# Patient Record
Sex: Male | Born: 1955 | Race: White | Hispanic: No | Smoking: Former smoker
Health system: Southern US, Community
[De-identification: ages and names within clinical notes are randomized; demographics above are authoritative.]

## PROBLEM LIST (undated history)

## (undated) DIAGNOSIS — K649 Unspecified hemorrhoids: Secondary | ICD-10-CM

## (undated) DIAGNOSIS — K219 Gastro-esophageal reflux disease without esophagitis: Secondary | ICD-10-CM

## (undated) DIAGNOSIS — F419 Anxiety disorder, unspecified: Secondary | ICD-10-CM

## (undated) DIAGNOSIS — C259 Malignant neoplasm of pancreas, unspecified: Secondary | ICD-10-CM

## (undated) DIAGNOSIS — K862 Cyst of pancreas: Secondary | ICD-10-CM

## (undated) DIAGNOSIS — K861 Other chronic pancreatitis: Secondary | ICD-10-CM

## (undated) DIAGNOSIS — I10 Essential (primary) hypertension: Secondary | ICD-10-CM

## (undated) DIAGNOSIS — Z973 Presence of spectacles and contact lenses: Secondary | ICD-10-CM

## (undated) DIAGNOSIS — Z8739 Personal history of other diseases of the musculoskeletal system and connective tissue: Secondary | ICD-10-CM

## (undated) HISTORY — DX: Essential (primary) hypertension: I10

## (undated) HISTORY — DX: Malignant neoplasm of pancreas, unspecified: C25.9

## (undated) HISTORY — PX: COLONOSCOPY: SHX174

---

## 2002-09-27 ENCOUNTER — Encounter: Admission: RE | Admit: 2002-09-27 | Discharge: 2002-09-27 | Payer: Self-pay | Admitting: Family Medicine

## 2002-09-27 ENCOUNTER — Encounter: Payer: Self-pay | Admitting: Family Medicine

## 2009-03-11 ENCOUNTER — Encounter: Admission: RE | Admit: 2009-03-11 | Discharge: 2009-03-11 | Payer: Self-pay | Admitting: Family Medicine

## 2013-12-08 ENCOUNTER — Ambulatory Visit
Admission: RE | Admit: 2013-12-08 | Discharge: 2013-12-08 | Disposition: A | Discharge status: Home / Self Care | Payer: BC Managed Care – PPO | Source: Ambulatory Visit | Attending: Physician Assistant | Admitting: Physician Assistant

## 2013-12-08 ENCOUNTER — Other Ambulatory Visit: Payer: Self-pay | Admitting: Physician Assistant

## 2013-12-08 DIAGNOSIS — T1490XA Injury, unspecified, initial encounter: Secondary | ICD-10-CM

## 2014-07-03 ENCOUNTER — Other Ambulatory Visit: Payer: Self-pay | Admitting: Family Medicine

## 2014-07-03 DIAGNOSIS — R109 Unspecified abdominal pain: Secondary | ICD-10-CM

## 2014-07-03 DIAGNOSIS — R140 Abdominal distension (gaseous): Secondary | ICD-10-CM

## 2014-07-04 ENCOUNTER — Ambulatory Visit
Admission: RE | Admit: 2014-07-04 | Discharge: 2014-07-04 | Disposition: A | Discharge status: Home / Self Care | Payer: BC Managed Care – PPO | Source: Ambulatory Visit | Attending: Family Medicine | Admitting: Family Medicine

## 2014-07-04 DIAGNOSIS — R140 Abdominal distension (gaseous): Secondary | ICD-10-CM

## 2014-07-04 DIAGNOSIS — R109 Unspecified abdominal pain: Secondary | ICD-10-CM

## 2014-07-05 ENCOUNTER — Other Ambulatory Visit: Payer: BC Managed Care – PPO

## 2014-07-26 ENCOUNTER — Other Ambulatory Visit: Payer: Self-pay | Admitting: Gastroenterology

## 2014-07-26 DIAGNOSIS — R1084 Generalized abdominal pain: Secondary | ICD-10-CM

## 2014-07-31 ENCOUNTER — Ambulatory Visit
Admission: RE | Admit: 2014-07-31 | Discharge: 2014-07-31 | Disposition: A | Discharge status: Home / Self Care | Payer: BC Managed Care – PPO | Source: Ambulatory Visit | Attending: Gastroenterology | Admitting: Gastroenterology

## 2014-07-31 DIAGNOSIS — R1084 Generalized abdominal pain: Secondary | ICD-10-CM

## 2014-07-31 MED ORDER — IOHEXOL 300 MG/ML  SOLN
125.0000 mL | Freq: Once | INTRAMUSCULAR | Status: AC | PRN
  Administered 2014-07-31: 125 mL via INTRAVENOUS

## 2014-08-01 ENCOUNTER — Other Ambulatory Visit: Payer: Self-pay | Admitting: Gastroenterology

## 2014-08-02 ENCOUNTER — Other Ambulatory Visit: Payer: Self-pay | Admitting: Gastroenterology

## 2014-08-02 ENCOUNTER — Encounter (HOSPITAL_COMMUNITY): Payer: Self-pay | Admitting: *Deleted

## 2014-08-02 NOTE — Addendum Note (Signed)
Addended by: Arta Silence on: 08/02/2014 10:36 AM   Modules accepted: Orders

## 2014-08-03 ENCOUNTER — Encounter (HOSPITAL_COMMUNITY)
Admission: RE | Disposition: A | Discharge status: Home / Self Care | Payer: Self-pay | Source: Ambulatory Visit | Attending: Gastroenterology

## 2014-08-03 ENCOUNTER — Encounter (HOSPITAL_COMMUNITY): Payer: BC Managed Care – PPO | Admitting: Anesthesiology

## 2014-08-03 ENCOUNTER — Ambulatory Visit (HOSPITAL_COMMUNITY)
Admission: RE | Admit: 2014-08-03 | Discharge: 2014-08-03 | Disposition: A | Discharge status: Home / Self Care | Payer: BC Managed Care – PPO | Source: Ambulatory Visit | Attending: Gastroenterology | Admitting: Gastroenterology

## 2014-08-03 ENCOUNTER — Encounter (HOSPITAL_COMMUNITY): Payer: Self-pay | Admitting: *Deleted

## 2014-08-03 ENCOUNTER — Ambulatory Visit (HOSPITAL_COMMUNITY): Payer: BC Managed Care – PPO | Admitting: Anesthesiology

## 2014-08-03 DIAGNOSIS — K869 Disease of pancreas, unspecified: Secondary | ICD-10-CM | POA: Insufficient documentation

## 2014-08-03 DIAGNOSIS — Z87891 Personal history of nicotine dependence: Secondary | ICD-10-CM | POA: Insufficient documentation

## 2014-08-03 DIAGNOSIS — R109 Unspecified abdominal pain: Secondary | ICD-10-CM | POA: Diagnosis present

## 2014-08-03 HISTORY — DX: Unspecified hemorrhoids: K64.9

## 2014-08-03 HISTORY — PX: EUS: SHX5427

## 2014-08-03 SURGERY — ESOPHAGEAL ENDOSCOPIC ULTRASOUND (EUS) RADIAL
Anesthesia: Monitor Anesthesia Care

## 2014-08-03 MED ORDER — BUTAMBEN-TETRACAINE-BENZOCAINE 2-2-14 % EX AERO
INHALATION_SPRAY | CUTANEOUS | Status: DC | PRN
  Administered 2014-08-03: 2 via TOPICAL

## 2014-08-03 MED ORDER — FENTANYL CITRATE 0.05 MG/ML IJ SOLN
INTRAMUSCULAR | Status: DC | PRN
  Administered 2014-08-03: 50 ug via INTRAVENOUS

## 2014-08-03 MED ORDER — LACTATED RINGERS IV SOLN
INTRAVENOUS | Status: DC
  Administered 2014-08-03: 1000 mL via INTRAVENOUS

## 2014-08-03 MED ORDER — PROPOFOL 10 MG/ML IV BOLUS
INTRAVENOUS | Status: DC | PRN
  Administered 2014-08-03: 20 mg via INTRAVENOUS
  Administered 2014-08-03 (×2): 30 mg via INTRAVENOUS

## 2014-08-03 MED ORDER — OXYCODONE-ACETAMINOPHEN 7.5-325 MG PO TABS: 1.0000 | ORAL_TABLET | Freq: Four times a day (QID) | ORAL | Status: DC | PRN

## 2014-08-03 MED ORDER — SODIUM CHLORIDE 0.9 % IV SOLN: INTRAVENOUS | Status: DC

## 2014-08-03 MED ORDER — PROPOFOL INFUSION 10 MG/ML OPTIME
INTRAVENOUS | Status: DC | PRN
  Administered 2014-08-03: 75 ug/kg/min via INTRAVENOUS

## 2014-08-03 MED ORDER — CIPROFLOXACIN IN D5W 400 MG/200ML IV SOLN
INTRAVENOUS | Status: AC
  Filled 2014-08-03: qty 200

## 2014-08-03 MED ORDER — MIDAZOLAM HCL 5 MG/5ML IJ SOLN
INTRAMUSCULAR | Status: DC | PRN
  Administered 2014-08-03: 2 mg via INTRAVENOUS

## 2014-08-03 MED ORDER — LACTATED RINGERS IV SOLN
INTRAVENOUS | Status: DC | PRN
  Administered 2014-08-03 (×2): via INTRAVENOUS

## 2014-08-03 NOTE — Anesthesia Preprocedure Evaluation (Signed)
Anesthesia Evaluation  Patient identified by MRN, date of birth, ID band Patient awake    Reviewed: Allergy & Precautions, H&P , NPO status , Patient's Chart, lab work & pertinent test results  History of Anesthesia Complications Negative for: history of anesthetic complications  Airway Mallampati: II TM Distance: >3 FB Neck ROM: Full    Dental  (+) Teeth Intact   Pulmonary neg sleep apnea, neg COPDneg recent URI, former smoker,  breath sounds clear to auscultation        Cardiovascular negative cardio ROS  Rhythm:Regular     Neuro/Psych negative neurological ROS  negative psych ROS   GI/Hepatic Neg liver ROS, Abdominal pain   Endo/Other  negative endocrine ROS  Renal/GU negative Renal ROS     Musculoskeletal   Abdominal   Peds  Hematology negative hematology ROS (+)   Anesthesia Other Findings   Reproductive/Obstetrics                           Anesthesia Physical Anesthesia Plan  ASA: II  Anesthesia Plan: MAC   Post-op Pain Management:    Induction: Intravenous  Airway Management Planned: Natural Airway  Additional Equipment: None  Intra-op Plan:   Post-operative Plan: Extubation in OR  Informed Consent: I have reviewed the patients History and Physical, chart, labs and discussed the procedure including the risks, benefits and alternatives for the proposed anesthesia with the patient or authorized representative who has indicated his/her understanding and acceptance.   Dental advisory given  Plan Discussed with: CRNA and Surgeon  Anesthesia Plan Comments:         Anesthesia Quick Evaluation

## 2014-08-03 NOTE — Transfer of Care (Signed)
Immediate Anesthesia Transfer of Care Note  Patient: Richard Cantu  Procedure(s) Performed: Procedure(s) with comments: ESOPHAGEAL ENDOSCOPIC ULTRASOUND (EUS) RADIAL (N/A) - H&P in file  Patient Location: Endoscopy Unit  Anesthesia Type:MAC  Level of Consciousness: awake, alert , oriented and patient cooperative  Airway & Oxygen Therapy: Patient Spontanous Breathing and Patient connected to nasal cannula oxygen  Post-op Assessment: Report given to PACU RN and Post -op Vital signs reviewed and stable  Post vital signs: Reviewed  Complications: No apparent anesthesia complications

## 2014-08-03 NOTE — Anesthesia Procedure Notes (Signed)
Procedure Name: MAC Date/Time: 08/03/2014 1:19 PM Performed by: Jenne Campus Pre-anesthesia Checklist: Patient identified, Emergency Drugs available, Suction available, Timeout performed and Patient being monitored Patient Re-evaluated:Patient Re-evaluated prior to inductionOxygen Delivery Method: Nasal cannula

## 2014-08-03 NOTE — Op Note (Signed)
[facility] [place], [address]   ENDOSCOPIC ULTRASOUND PROCEDURE REPORT  PATIENT: Richard Cantu, Richard Cantu  MR#: [id number] BIRTHDATE: 08/16/1956  GENDER: Male ENDOSCOPIST: Arta Silence, MD REFERRED BY:  Wilford Corner, M.D. PROCEDURE DATE:  08/03/2014 PROCEDURE:   Upper EUS ASA CLASS:      Class II INDICATIONS:   1.  abdominal pain, pancreatic mass on CT. MEDICATIONS: MAC sedation, administered by CRNA and Cetacaine spray x 2  DESCRIPTION OF PROCEDURE:   After the risks benefits and alternatives of the procedure were  explained, informed consent was obtained. The patient was then placed in the left, lateral, decubitus postion and IV sedation was administered. Throughout the procedure, the patients blood pressure, pulse and oxygen saturations were monitored continuously.  Under direct visualization, the [id number] ([id number]) and [id number] ([id number]) endoscope was introduced through the mouth  and advanced to the second portion of the duodenum .  Water was used as necessary to provide an acoustic interface.  Upon completion of the imaging, water was removed and the patient was sent to the recovery room in satisfactory condition. (No images recovered due to image cart being used for emergency procedure elsewhere in hospital)  FINDINGS:      Extensive lobularity, especially in the body and tail of pancreas, as well as scattered hyperechoic strands/foci, all consistent with resolving acute pancreatitis versus smoldering chronic pancreatitis (favored).  At the interface of the head and uncinate pancreas, an approximately 3 x 3cm cystic region, not well-demarcated, with small amount of internal debris, was noted. There was no obvious solid component to this area.  No obvious communication of this area with the pancreatic duct. A few triangular benign-appearing peripancreatic lymph nodes were seen. Normal appearance to bile duct.  Prominent  pancreatic duct (38mm) without obstructing lesion.  IMPRESSION:     As above.  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   No obvious solid pancreatic mass lesion was seen.  Changes in pancreas noted also that would favor evolving chronic pancreatitis.  RECOMMENDATIONS:     1.  Watch for potential complications of procedure. 2.  Strict alcohol abstinence. 3.  Trial of pancreatic enzymes for bloating and pain symptoms. 4.  Vicodin 5/500, one po q 6 hrs prn pain, #15, no refills. 5.  Consider repeat CT in 2-3 months. 6.  Will discuss case with Dr. Michail Sermon.   _______________________________ Lorrin MaisArta Silence, MD 08/03/2014 2:23 PM   CC:

## 2014-08-03 NOTE — H&P (Signed)
Patient interval history reviewed.  Patient examined again.  There has been no change from documented H/P (scanned into chart from our office) except as documented above.  Assessment:  1.  Abdominal pain. 2.  Pancreatic mass on CT scan.  Plan:  1.  Endoscopic ultrasound with possible fine needle aspiration biopsies. 2.  Risks (bleeding, infection, bowel perforation that could require surgery, sedation-related changes in cardiopulmonary systems), benefits (identification and possible treatment of source of symptoms, exclusion of certain causes of symptoms), and alternatives (watchful waiting, radiographic imaging studies, empiric medical treatment) of upper endoscopy with ultrasound and possible biopsies (EUS +/- FNA) were explained to patient/family in detail and patient wishes to proceed.

## 2014-08-03 NOTE — Discharge Instructions (Signed)
Endoscopic Ultrasound ° °Care After °Please read the instructions outlined below and refer to this sheet in the next few weeks. These discharge instructions provide you with general information on caring for yourself after you leave the hospital. Your doctor may also give you specific instructions. While your treatment has been planned according to the most current medical practices available, unavoidable complications occasionally occur. If you have any problems or questions after discharge, please call Dr. Khalaya Mcgurn ([facility]) at [phone]. ° °HOME CARE INSTRUCTIONS °Activity °· You may resume your regular activity but move at a slower pace for the next 24 hours.  °· Take frequent rest periods for the next 24 hours.  °· Walking will help expel (get rid of) the air and reduce the bloated feeling in your abdomen.  °· No driving for 24 hours (because of the anesthesia (medicine) used during the test).  °· You may shower.  °· Do not sign any important legal documents or operate any machinery for 24 hours (because of the anesthesia used during the test).  °Nutrition °· Drink plenty of fluids.  °· You may resume your normal diet.  °· Begin with a light meal and progress to your normal diet.  °· Avoid alcoholic beverages for 24 hours or as instructed by your caregiver.  °Medications °You may resume your normal medications unless your caregiver tells you otherwise. °What you can expect today °· You may experience abdominal discomfort such as a feeling of fullness or "gas" pains.  °· You may experience a sore throat for 2 to 3 days. This is normal. Gargling with salt water may help this.  °·  °SEEK IMMEDIATE MEDICAL CARE IF: °· You have excessive nausea (feeling sick to your stomach) and/or vomiting.  °· You have severe abdominal pain and distention (swelling).  °· You have trouble swallowing.  °· You have a temperature over 100° F (37.8° C).  °· You have rectal bleeding or vomiting of blood.  °Document  Released: 06/23/2004 Document Revised: 07/22/2011 Document Reviewed: 01/04/2008 °Exit[employer]® Patient Information ©2012 [employer]. °

## 2014-08-03 NOTE — Anesthesia Postprocedure Evaluation (Signed)
  Anesthesia Post-op Note  Patient: Richard Cantu  Procedure(s) Performed: Procedure(s) with comments: ESOPHAGEAL ENDOSCOPIC ULTRASOUND (EUS) RADIAL (N/A) - H&P in file  Patient Location: Endoscopy Unit  Anesthesia Type:MAC  Level of Consciousness: awake  Airway and Oxygen Therapy: Patient Spontanous Breathing  Post-op Pain: none  Post-op Assessment: Post-op Vital signs reviewed, Patient's Cardiovascular Status Stable, Respiratory Function Stable, Patent Airway, No signs of Nausea or vomiting and Pain level controlled  Post-op Vital Signs: Reviewed and stable  Last Vitals:  Filed Vitals:   08/03/14 1416  BP:   Pulse: 86  Temp:   Resp: 14    Complications: No apparent anesthesia complications

## 2014-08-06 ENCOUNTER — Encounter (HOSPITAL_COMMUNITY): Payer: Self-pay | Admitting: Gastroenterology

## 2014-08-28 ENCOUNTER — Other Ambulatory Visit: Payer: Self-pay | Admitting: Gastroenterology

## 2014-08-28 DIAGNOSIS — K863 Pseudocyst of pancreas: Secondary | ICD-10-CM

## 2014-08-30 ENCOUNTER — Inpatient Hospital Stay (HOSPITAL_COMMUNITY)
Admission: EM | Admit: 2014-08-30 | Discharge: 2014-09-08 | DRG: 423 | Disposition: A | Discharge status: Home / Self Care | Payer: BC Managed Care – PPO | Attending: Internal Medicine | Admitting: Internal Medicine

## 2014-08-30 ENCOUNTER — Encounter (HOSPITAL_COMMUNITY): Payer: Self-pay | Admitting: Emergency Medicine

## 2014-08-30 ENCOUNTER — Emergency Department (HOSPITAL_COMMUNITY): Payer: BC Managed Care – PPO

## 2014-08-30 DIAGNOSIS — K863 Pseudocyst of pancreas: Secondary | ICD-10-CM | POA: Diagnosis present

## 2014-08-30 DIAGNOSIS — R1010 Upper abdominal pain, unspecified: Secondary | ICD-10-CM

## 2014-08-30 DIAGNOSIS — G8929 Other chronic pain: Secondary | ICD-10-CM | POA: Diagnosis present

## 2014-08-30 DIAGNOSIS — Z6827 Body mass index (BMI) 27.0-27.9, adult: Secondary | ICD-10-CM | POA: Diagnosis not present

## 2014-08-30 DIAGNOSIS — F1010 Alcohol abuse, uncomplicated: Secondary | ICD-10-CM | POA: Diagnosis present

## 2014-08-30 DIAGNOSIS — K861 Other chronic pancreatitis: Secondary | ICD-10-CM | POA: Diagnosis present

## 2014-08-30 DIAGNOSIS — E46 Unspecified protein-calorie malnutrition: Secondary | ICD-10-CM | POA: Diagnosis present

## 2014-08-30 DIAGNOSIS — C250 Malignant neoplasm of head of pancreas: Secondary | ICD-10-CM | POA: Diagnosis present

## 2014-08-30 DIAGNOSIS — Z87891 Personal history of nicotine dependence: Secondary | ICD-10-CM

## 2014-08-30 DIAGNOSIS — K859 Acute pancreatitis without necrosis or infection, unspecified: Secondary | ICD-10-CM

## 2014-08-30 DIAGNOSIS — I10 Essential (primary) hypertension: Secondary | ICD-10-CM | POA: Diagnosis present

## 2014-08-30 DIAGNOSIS — R109 Unspecified abdominal pain: Secondary | ICD-10-CM

## 2014-08-30 DIAGNOSIS — R1084 Generalized abdominal pain: Secondary | ICD-10-CM

## 2014-08-30 DIAGNOSIS — C259 Malignant neoplasm of pancreas, unspecified: Secondary | ICD-10-CM

## 2014-08-30 HISTORY — DX: Other chronic pancreatitis: K86.1

## 2014-08-30 HISTORY — DX: Anxiety disorder, unspecified: F41.9

## 2014-08-30 HISTORY — DX: Personal history of other diseases of the musculoskeletal system and connective tissue: Z87.39

## 2014-08-30 HISTORY — DX: Cyst of pancreas: K86.2

## 2014-08-30 LAB — CBC
HCT: 43 % (ref 39.0–52.0)
Hemoglobin: 15.9 g/dL (ref 13.0–17.0)
MCH: 32.6 pg (ref 26.0–34.0)
MCHC: 37 g/dL — AB (ref 30.0–36.0)
MCV: 88.1 fL (ref 78.0–100.0)
PLATELETS: 306 10*3/uL (ref 150–400)
RBC: 4.88 MIL/uL (ref 4.22–5.81)
RDW: 11.4 % — ABNORMAL LOW (ref 11.5–15.5)
WBC: 9.8 10*3/uL (ref 4.0–10.5)

## 2014-08-30 LAB — CBC WITH DIFFERENTIAL/PLATELET
BASOS PCT: 1 % (ref 0–1)
Basophils Absolute: 0 10*3/uL (ref 0.0–0.1)
EOS ABS: 0.1 10*3/uL (ref 0.0–0.7)
EOS PCT: 1 % (ref 0–5)
HCT: 42.3 % (ref 39.0–52.0)
Hemoglobin: 15.4 g/dL (ref 13.0–17.0)
Lymphocytes Relative: 19 % (ref 12–46)
Lymphs Abs: 1.5 10*3/uL (ref 0.7–4.0)
MCH: 31.8 pg (ref 26.0–34.0)
MCHC: 36.4 g/dL — AB (ref 30.0–36.0)
MCV: 87.4 fL (ref 78.0–100.0)
Monocytes Absolute: 0.5 10*3/uL (ref 0.1–1.0)
Monocytes Relative: 7 % (ref 3–12)
Neutro Abs: 5.8 10*3/uL (ref 1.7–7.7)
Neutrophils Relative %: 72 % (ref 43–77)
PLATELETS: 316 10*3/uL (ref 150–400)
RBC: 4.84 MIL/uL (ref 4.22–5.81)
RDW: 11.3 % — ABNORMAL LOW (ref 11.5–15.5)
WBC: 7.9 10*3/uL (ref 4.0–10.5)

## 2014-08-30 LAB — COMPREHENSIVE METABOLIC PANEL
ALBUMIN: 4.7 g/dL (ref 3.5–5.2)
ALK PHOS: 51 U/L (ref 39–117)
ALT: 28 U/L (ref 0–53)
ANION GAP: 16 — AB (ref 5–15)
AST: 20 U/L (ref 0–37)
BUN: 21 mg/dL (ref 6–23)
CALCIUM: 9.6 mg/dL (ref 8.4–10.5)
CO2: 25 mEq/L (ref 19–32)
Chloride: 96 mEq/L (ref 96–112)
Creatinine, Ser: 0.85 mg/dL (ref 0.50–1.35)
GFR calc non Af Amer: >90 mL/min (ref >90)
Glucose, Bld: 123 mg/dL — ABNORMAL HIGH (ref 70–99)
POTASSIUM: 4.3 meq/L (ref 3.7–5.3)
Sodium: 137 mEq/L (ref 137–147)
TOTAL PROTEIN: 8.1 g/dL (ref 6.0–8.3)
Total Bilirubin: 0.7 mg/dL (ref 0.3–1.2)

## 2014-08-30 LAB — CREATININE, SERUM
CREATININE: 0.72 mg/dL (ref 0.50–1.35)
GFR calc Af Amer: >90 mL/min (ref >90)
GFR calc non Af Amer: >90 mL/min (ref >90)

## 2014-08-30 LAB — HEMOGLOBIN A1C
HEMOGLOBIN A1C: 5.5 % (ref <5.7)
Mean Plasma Glucose: 111 mg/dL (ref <117)

## 2014-08-30 LAB — URINALYSIS, ROUTINE W REFLEX MICROSCOPIC
Bilirubin Urine: NEGATIVE
Glucose, UA: NEGATIVE mg/dL
HGB URINE DIPSTICK: NEGATIVE
Ketones, ur: NEGATIVE mg/dL
LEUKOCYTES UA: NEGATIVE
Nitrite: NEGATIVE
PROTEIN: NEGATIVE mg/dL
SPECIFIC GRAVITY, URINE: 1.017 (ref 1.005–1.030)
UROBILINOGEN UA: 0.2 mg/dL (ref 0.0–1.0)
pH: 6 (ref 5.0–8.0)

## 2014-08-30 LAB — LIPASE, BLOOD: Lipase: 50 U/L (ref 11–59)

## 2014-08-30 MED ORDER — ACETAMINOPHEN 325 MG PO TABS
650.0000 mg | ORAL_TABLET | Freq: Four times a day (QID) | ORAL | Status: DC | PRN
Start: 2014-08-30 — End: 2014-09-02

## 2014-08-30 MED ORDER — ALUM & MAG HYDROXIDE-SIMETH 200-200-20 MG/5ML PO SUSP
30.0000 mL | Freq: Four times a day (QID) | ORAL | Status: DC | PRN
  Filled 2014-08-30: qty 30

## 2014-08-30 MED ORDER — HYDROMORPHONE HCL 1 MG/ML IJ SOLN
1.0000 mg | INTRAMUSCULAR | Status: AC | PRN
  Administered 2014-08-30 (×2): 1 mg via INTRAVENOUS
  Filled 2014-08-30 (×2): qty 1

## 2014-08-30 MED ORDER — ACETAMINOPHEN 650 MG RE SUPP: 650.0000 mg | Freq: Four times a day (QID) | RECTAL | Status: DC | PRN

## 2014-08-30 MED ORDER — HYDROMORPHONE HCL 1 MG/ML IJ SOLN
1.0000 mg | INTRAMUSCULAR | Status: DC | PRN
  Administered 2014-08-30: 1 mg via INTRAVENOUS
  Filled 2014-08-30: qty 1

## 2014-08-30 MED ORDER — HYDRALAZINE HCL 20 MG/ML IJ SOLN: 10.0000 mg | Freq: Four times a day (QID) | INTRAMUSCULAR | Status: DC | PRN

## 2014-08-30 MED ORDER — PANTOPRAZOLE SODIUM 40 MG IV SOLR
40.0000 mg | Freq: Two times a day (BID) | INTRAVENOUS | Status: DC
  Administered 2014-08-30 – 2014-09-01 (×4): 40 mg via INTRAVENOUS
  Filled 2014-08-30 (×7): qty 40

## 2014-08-30 MED ORDER — ONDANSETRON HCL 4 MG PO TABS
4.0000 mg | ORAL_TABLET | Freq: Four times a day (QID) | ORAL | Status: DC | PRN
Start: 2014-08-30 — End: 2014-09-01

## 2014-08-30 MED ORDER — HYDROMORPHONE HCL 1 MG/ML IJ SOLN: 1.0000 mg | INTRAMUSCULAR | Status: DC | PRN

## 2014-08-30 MED ORDER — OXYCODONE HCL 5 MG PO TABS
5.0000 mg | ORAL_TABLET | ORAL | Status: DC | PRN
  Administered 2014-08-30 – 2014-09-01 (×7): 10 mg via ORAL
  Filled 2014-08-30 (×2): qty 2
  Filled 2014-08-30: qty 1
  Filled 2014-08-30 (×3): qty 2
  Filled 2014-08-30: qty 1
  Filled 2014-08-30: qty 2

## 2014-08-30 MED ORDER — HYDROMORPHONE HCL 1 MG/ML IJ SOLN
1.0000 mg | Freq: Once | INTRAMUSCULAR | Status: AC
  Administered 2014-08-30: 1 mg via INTRAVENOUS
  Filled 2014-08-30: qty 1

## 2014-08-30 MED ORDER — SODIUM CHLORIDE 0.9 % IV SOLN
INTRAVENOUS | Status: DC
  Administered 2014-08-30: 125 mL via INTRAVENOUS
  Administered 2014-08-31: via INTRAVENOUS

## 2014-08-30 MED ORDER — SODIUM CHLORIDE 0.9 % IV BOLUS (SEPSIS)
1000.0000 mL | Freq: Once | INTRAVENOUS | Status: AC
  Administered 2014-08-30: 1000 mL via INTRAVENOUS

## 2014-08-30 MED ORDER — ONDANSETRON HCL 4 MG/2ML IJ SOLN
4.0000 mg | Freq: Once | INTRAMUSCULAR | Status: AC
  Administered 2014-08-30: 4 mg via INTRAVENOUS
  Filled 2014-08-30: qty 2

## 2014-08-30 MED ORDER — HYDROMORPHONE HCL 1 MG/ML IJ SOLN: 1.0000 mg | Freq: Once | INTRAMUSCULAR | Status: DC

## 2014-08-30 MED ORDER — SENNOSIDES-DOCUSATE SODIUM 8.6-50 MG PO TABS
1.0000 | ORAL_TABLET | Freq: Every evening | ORAL | Status: DC | PRN
  Administered 2014-08-31 – 2014-09-02 (×3): 1 via ORAL
  Filled 2014-08-30 (×3): qty 1

## 2014-08-30 MED ORDER — HEPARIN SODIUM (PORCINE) 5000 UNIT/ML IJ SOLN
5000.0000 [IU] | Freq: Three times a day (TID) | INTRAMUSCULAR | Status: DC
  Administered 2014-08-30 – 2014-09-08 (×26): 5000 [IU] via SUBCUTANEOUS
  Filled 2014-08-30 (×37): qty 1

## 2014-08-30 MED ORDER — HYDROMORPHONE HCL 1 MG/ML IJ SOLN
1.0000 mg | INTRAMUSCULAR | Status: DC | PRN
  Administered 2014-08-30 – 2014-09-01 (×12): 1 mg via INTRAVENOUS
  Filled 2014-08-30 (×12): qty 1

## 2014-08-30 MED ORDER — ONDANSETRON HCL 4 MG/2ML IJ SOLN: 4.0000 mg | Freq: Three times a day (TID) | INTRAMUSCULAR | Status: DC | PRN

## 2014-08-30 MED ORDER — IOHEXOL 300 MG/ML  SOLN
100.0000 mL | Freq: Once | INTRAMUSCULAR | Status: AC | PRN
  Administered 2014-08-30: 100 mL via INTRAVENOUS

## 2014-08-30 MED ORDER — ONDANSETRON 4 MG PO TBDP
8.0000 mg | ORAL_TABLET | Freq: Once | ORAL | Status: AC
  Administered 2014-08-30: 8 mg via ORAL
  Filled 2014-08-30: qty 2

## 2014-08-30 MED ORDER — FENTANYL CITRATE 0.05 MG/ML IJ SOLN
50.0000 ug | Freq: Once | INTRAMUSCULAR | Status: AC
  Administered 2014-08-30: 50 ug via INTRAVENOUS
  Filled 2014-08-30: qty 2

## 2014-08-30 MED ORDER — ONDANSETRON HCL 4 MG/2ML IJ SOLN
4.0000 mg | Freq: Four times a day (QID) | INTRAMUSCULAR | Status: DC | PRN
  Administered 2014-08-30 – 2014-09-08 (×8): 4 mg via INTRAVENOUS
  Filled 2014-08-30 (×9): qty 2

## 2014-08-30 MED ORDER — IOHEXOL 300 MG/ML  SOLN
25.0000 mL | INTRAMUSCULAR | Status: AC
  Administered 2014-08-30: 25 mL via ORAL

## 2014-08-30 MED ORDER — ALPRAZOLAM 0.5 MG PO TABS
1.0000 mg | ORAL_TABLET | Freq: Every evening | ORAL | Status: DC | PRN
  Administered 2014-08-30 – 2014-09-06 (×7): 1 mg via ORAL
  Filled 2014-08-30 (×8): qty 2

## 2014-08-30 NOTE — ED Notes (Signed)
The pt has chronic  Pancreatitis his last alcohol was one month ago.  He has not been able to keep any food down.  n and v.  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  He is scheduled for a c-t scan Friday.  He has taken 2 percocet 1 vicodin oxycontin and his pain is not any better

## 2014-08-30 NOTE — Consult Note (Signed)
[facility]enterology Consultation Note  Referring Provider: Dr. Estill Cotta [facility]) Primary Care Physician:  Donnie Coffin, MD  Reason for Consultation:  Abdominal pain, pancreatic lesion  HPI: Richard Cantu is a 58 y.o. male admitted for abdominal pain and abnormal CT abdomen.  Patient with history of heavy alcohol use and presented with progressive post-prandial abdominal pain and ~ 10-lb weight loss, and CT showing lesion in head of pancreas.  Has stopped alcohol entirely over the past one month, with no improvement of symptoms.  Had CT repeated showing stable-appearing lesion in head of pancreas with neighboring adenopathy worrisome for malignancy.  Endoscopic ultrasound done about one month ago, with appearance most typical of acute on chronic pancreatitis and pancreatic cyst.  Unable to eat more than liquids without having significant pain and nausea and vomiting.   Past Medical History  Diagnosis Date  . Hemorrhoids   . Pancreatitis     Past Surgical History  Procedure Laterality Date  . Colonoscopy    . Eus N/A 08/03/2014    Procedure: ESOPHAGEAL ENDOSCOPIC ULTRASOUND (EUS) RADIAL;  Surgeon: Arta Silence, MD;  Location: [place];  Service: Endoscopy;  Laterality: N/A;  H&P in file    Prior to Admission medications   Medication Sig Start Date End Date Taking? Authorizing Provider  ALPRAZolam Duanne Moron) 1 MG tablet Take 1 mg by mouth at bedtime as needed for anxiety.   Yes Historical Provider, MD  HYDROcodone-acetaminophen (NORCO/VICODIN) 5-325 MG per tablet Take 1 tablet by mouth every 6 (six) hours as needed for moderate pain.   Yes Historical Provider, MD  ibuprofen (ADVIL,MOTRIN) 200 MG tablet Take 800 mg by mouth every 8 (eight) hours as needed for moderate pain.   Yes Historical Provider, MD  lactose free nutrition (BOOST) LIQD Take 237 mLs by mouth 4 (four) times daily.   Yes Historical Provider, MD  lipase/protease/amylase (CREON) 12000 UNITS CPEP capsule Take 12,000  Units by mouth 3 (three) times daily with meals.   Yes Historical Provider, MD  oxyCODONE-acetaminophen (PERCOCET) 7.5-325 MG per tablet Take 1 tablet by mouth every 6 (six) hours as needed for pain. 08/03/14  Yes Arta Silence, MD  pantoprazole (PROTONIX) 40 MG tablet Take 40 mg by mouth daily.   Yes Historical Provider, MD  Probiotic Product (PROBIOTIC DAILY PO) Take 1 tablet by mouth daily.   Yes Historical Provider, MD    Current Facility-Administered Medications  Medication Dose Route Frequency Provider Last Rate Last Dose  . 0.9 %  sodium chloride infusion   Intravenous Continuous Ripudeep K Rai, MD 125 mL/hr at 08/30/14 1444 125 mL at 08/30/14 1444  . acetaminophen (TYLENOL) tablet 650 mg  650 mg Oral Q6H PRN Ripudeep Krystal Eaton, MD       Or  . acetaminophen (TYLENOL) suppository 650 mg  650 mg Rectal Q6H PRN Ripudeep Krystal Eaton, MD      . ALPRAZolam Duanne Moron) tablet 1 mg  1 mg Oral QHS PRN Ripudeep Krystal Eaton, MD      . alum & mag hydroxide-simeth (MAALOX/MYLANTA) 200-200-20 MG/5ML suspension 30 mL  30 mL Oral Q6H PRN Ripudeep K Rai, MD      . heparin injection 5,000 Units  5,000 Units Subcutaneous 3 times per day Ripudeep Krystal Eaton, MD      . hydrALAZINE (APRESOLINE) injection 10 mg  10 mg Intravenous Q6H PRN Ripudeep K Rai, MD      . HYDROmorphone (DILAUDID) injection 1 mg  1 mg Intravenous Q3H PRN Ripudeep Krystal Eaton, MD  1 mg at 08/30/14 1444  . ondansetron (ZOFRAN) tablet 4 mg  4 mg Oral Q6H PRN Ripudeep K Rai, MD       Or  . ondansetron (ZOFRAN) injection 4 mg  4 mg Intravenous Q6H PRN Ripudeep Krystal Eaton, MD   4 mg at 08/30/14 1445  . oxyCODONE (Oxy IR/ROXICODONE) immediate release tablet 5-10 mg  5-10 mg Oral Q4H PRN Ripudeep K Rai, MD      . pantoprazole (PROTONIX) injection 40 mg  40 mg Intravenous Q12H Ripudeep K Rai, MD      . senna-docusate (Senokot-S) tablet 1 tablet  1 tablet Oral QHS PRN Ripudeep Krystal Eaton, MD        Allergies as of 08/30/2014  . (No Known Allergies)    No family history on  file.  History   Social History  . Marital Status: Divorced    Spouse Name: N/A    Number of Children: N/A  . Years of Education: N/A   Occupational History  . Not on file.   Social History Main Topics  . Smoking status: Former Smoker -- 20 years  . Smokeless tobacco: Not on file  . Alcohol Use: 16.8 oz/week    28 Cans of beer per week  . Drug Use: No  . Sexual Activity: Yes     Comment: 08/02/14--quit a couple years ago   Other Topics Concern  . Not on file   Social History Narrative  . No narrative on file    Review of Systems: ROS Dr. Tana Coast 08/30/14 reviewed and I agree  Physical Exam: Vital signs in last 24 hours: Temp:  [97.5 F (36.4 C)-98.7 F (37.1 C)] 97.5 F (36.4 C) (10/08 1411) Pulse Rate:  [67-111] 73 (10/08 1411) Resp:  [11-28] 17 (10/08 1411) BP: (134-175)/(82-109) 158/90 mmHg (10/08 1411) SpO2:  [94 %-100 %] 99 % (10/08 1411) Weight:  [83.689 kg (184 lb 8 oz)] 83.689 kg (184 lb 8 oz) (10/08 0029)   General:   Alert,  Uncomfortable-appearing but not acutely toxic-appearing Head:  Normocephalic and atraumatic. Eyes:  Sclera clear, no icterus.   Conjunctiva pink. Ears:  Normal auditory acuity. Nose:  No deformity, discharge,  or lesions. Mouth:  No deformity or lesions.  Oropharynx pink but somewhat dry Neck:  Supple; no masses or thyromegaly. Lungs:  Clear throughout to auscultation.   No wheezes, crackles, or rhonchi. No acute distress. Heart:  Regular rate and rhythm; no murmurs, clicks, rubs,  or gallops. Abdomen:  Soft, epigastric tenderness with some voluntary guarding to moderate palpation. No masses, hepatosplenomegaly or hernias noted. Bowel sounds are present.  No peritonitis   Msk:  Symmetrical without gross deformities. Normal posture. Pulses:  Normal pulses noted. Extremities:  Without clubbing or edema. Neurologic:  Alert and  oriented x4;  Diffusely weak, otherwise grossly normal neurologically. Skin:  Intact without significant lesions  or rashes. Psych:  Alert and cooperative. Normal mood and affect.   Lab Results:  Recent Labs  08/30/14 0032 08/30/14 1430  WBC 7.9 9.8  HGB 15.4 15.9  HCT 42.3 43.0  PLT 316 306   BMET  Recent Labs  08/30/14 0032 08/30/14 1430  NA 137  --   K 4.3  --   CL 96  --   CO2 25  --   GLUCOSE 123*  --   BUN 21  --   CREATININE 0.85 0.72  CALCIUM 9.6  --    LFT  Recent Labs  08/30/14 0032  PROT  8.1  ALBUMIN 4.7  AST 20  ALT 28  ALKPHOS 51  BILITOT 0.7   PT/INR No results found for this basename: LABPROT, INR,  in the last 72 hours  Studies/Results: Ct Abdomen Pelvis W Contrast  08/30/2014   CLINICAL DATA:  Chronic pancreatitis. Nausea vomiting. Followup exam.  EXAM: CT ABDOMEN AND PELVIS WITH CONTRAST  TECHNIQUE: Multidetector CT imaging of the abdomen and pelvis was performed using the standard protocol following bolus administration of intravenous contrast.  CONTRAST:  138mL OMNIPAQUE IOHEXOL 300 MG/ML  SOLN  COMPARISON:  CT 07/31/2014.  FINDINGS: No focal hepatic abnormality. Spleen normal. Ill-defined complex pancreatic head mass is again noted. The mass measures 4.9 by 3.5 cm. Central lucency is present suggesting necrosis. This finding is highly suspicious with pancreatic cancer. Adjacent nodular PA pancreatic densities consistent peripancreatic lymph nodes are again noted and unchanged. These measure up to approximately 1 cm. 1 cm retroperitoneal lymph nodes are again noted. No associated biliary distention. Gallbladder is nondistended. Dorsal torsion of the fat planes about the mesenteric vasculature noted with possible narrowing of the SMA. Portal vein is patent.  Adrenals are normal. Kidneys are normal. No hydronephrosis or obstructing ureteral stone. The bladder is nondistended. Prostate is slightly enlarged.  No significant inguinal adenopathy. As noted above 1 cm retroperitoneal adenopathy is present along with peripancreatic adenopathy.  Appendix normal. No bowel  distention. No free air. No gastric distention. No significant abdominal wall hernia.  Mild basilar atelectasis. Heart size normal. No acute bony abnormality. Diffuse degenerative changes lumbar spine, thickening prominent at L5-S1.  IMPRESSION: 1. 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 Stable adjacent peripancreatic and retroperitoneal lymph nodes. SMA involvement cannot be excluded. No evidence of biliary obstruction. Similar findings noted on prior CT of 07/31/2014. 2. No acute intra-abdominal abnormality identified.   Electronically Signed   By: Marcello Moores  Register   On: 08/30/2014 09:45   Impression:  1.  Abdominal pain. 2.  Weight loss. 3.  Pancreatic mass on CT.  No appreciable change in size over the past one month. On recent endoscopic ultrasound, lesion appears most consistent with pseudocyst or fluid collection, which it certainly still could be.  Alternatively, patient could have a near-completely necrotic adenocarcinoma, an aggressive cystic pancreatic neoplasm, or even a cystic neuroendocrine lesion.  Plan:  1.  Pain control is most pressing issue at this point.  Would titrate analgesics as needed; I wouldn't be surprised if patient ultimately requires a PCA pump. 2.  Check CEA and Ca 19-9 levels. 3.  Sips clear liquids only. 4.  Once patient's pain is under better control, would repeat endoscopic ultrasound, which I would envision happening some time early next week, to repeat imaging and, then, aspirate cystic region (looking for infection versus pseudocyst versus necrotic tumor). 5.  [facility] will follow.   LOS: 0 days   Maryjane Benedict M  08/30/2014, 4:51 PM

## 2014-08-30 NOTE — ED Provider Notes (Signed)
Patient seen and evaluated. I discussed the case with Alecia Lemming PA. Patient still continued pain after IV fentanyl, and doses of IV Dilaudid x2. CT scan shows stable size of pancreatic mass, probable pseudocyst or recent ultrasound. Patient's pain still uncontrolled. Case discussed with Dr. Paulita Fujita by PA. He felt that if his pain was uncontrolled, the patient could be admitted for symptom control and discussion, consideration for possible drainage procedure. I discussed this at length with patient. His abdomen shows no peritoneal irritation. However, with continued upper abdominal pain and tenderness.  Tanna Furry, MD 08/30/14 1120

## 2014-08-30 NOTE — H&P (Signed)
History and Physical       Hospital Admission Note Date: 08/30/2014  Patient name: Richard Cantu Medical record number: [id number] Date of birth: 1956/06/02 Age: 58 y.o. Gender: male  PCP: Donnie Coffin, MD    Chief Complaint:  Acute on chronic abdominal pain  HPI: Patient is a 58 year old male with history of alcohol abuse, quit drinking in September 2015, chronic pancreatitis with pseudocyst, presented to the ER with worsening upper abdominal pain. Per patient, he has been having significant pain over the last 1 month, has been using Percocet every 6 hours but worsened in the last 2 days. Patient reports that he has been unable to eat for the last 1 week and has been trying to eat pure diet. He has been having nausea, belching and had 1 episode of vomiting today and has been having significant nausea in the last week. Patient has been followed by Dr. Paulita Fujita with gastroenterology. He recently had endoscopy ultrasound done on 9/11 which showed chronic pancreatitis, 3x3 cm pancreatic pseudocyst. CT abdomen today showed 4.9 x 3.5 cm pancreatic head mass, likely pancreatic pseudocyst. GI has been consulted by EDP. Patient will be admitted for acute on chronic pancreatitis. Patient has an appointment for pain clinic on Tuesday, 10/13  Review of Systems:  Constitutional: Denies fever, chills, diaphoresis, poor appetite and fatigue.  HEENT: Denies photophobia, eye pain, redness, hearing loss, ear pain, congestion, sore throat, rhinorrhea, sneezing, mouth sores, trouble swallowing, neck pain, neck stiffness and tinnitus.   Respiratory: Denies SOB, DOE, cough, chest tightness,  and wheezing.   Cardiovascular: Denies chest pain, palpitations and leg swelling.  Gastrointestinal: Please see history of present illness Genitourinary: Denies dysuria, urgency, frequency, hematuria, flank pain and difficulty urinating.  Musculoskeletal: Denies  myalgias, joint swelling, arthralgias and gait problem.  Skin: Denies pallor, rash and wound.  Neurological: Denies dizziness, seizures, syncope, weakness, light-headedness, numbness and headaches.  Hematological: Denies adenopathy. Easy bruising, personal or family bleeding history  Psychiatric/Behavioral: Denies suicidal ideation, mood changes, confusion, nervousness, sleep disturbance and agitation  Past Medical History: Past Medical History  Diagnosis Date  . Hemorrhoids   . Pancreatitis    Past Surgical History  Procedure Laterality Date  . Colonoscopy    . Eus N/A 08/03/2014    Procedure: ESOPHAGEAL ENDOSCOPIC ULTRASOUND (EUS) RADIAL;  Surgeon: Arta Silence, MD;  Location: [facility];  Service: Endoscopy;  Laterality: N/A;  H&P in file    Medications: Prior to Admission medications   Medication Sig Start Date End Date Taking? Authorizing Provider  ALPRAZolam Duanne Moron) 1 MG tablet Take 1 mg by mouth at bedtime as needed for anxiety.   Yes Historical Provider, MD  HYDROcodone-acetaminophen (NORCO/VICODIN) 5-325 MG per tablet Take 1 tablet by mouth every 6 (six) hours as needed for moderate pain.   Yes Historical Provider, MD  ibuprofen (ADVIL,MOTRIN) 200 MG tablet Take 800 mg by mouth every 8 (eight) hours as needed for moderate pain.   Yes Historical Provider, MD  lactose free nutrition (BOOST) LIQD Take 237 mLs by mouth 4 (four) times daily.   Yes Historical Provider, MD  lipase/protease/amylase (CREON) 12000 UNITS CPEP capsule Take 12,000 Units by mouth 3 (three) times daily with meals.   Yes Historical Provider, MD  oxyCODONE-acetaminophen (PERCOCET) 7.5-325 MG per tablet Take 1 tablet by mouth every 6 (six) hours as needed for pain. 08/03/14  Yes Arta Silence, MD  pantoprazole (PROTONIX) 40 MG tablet Take 40 mg by mouth daily.   Yes Historical Provider, MD  Probiotic Product (  PROBIOTIC DAILY PO) Take 1 tablet by mouth daily.   Yes Historical Provider, MD    Allergies:  No  Known Allergies  Social History:  reports that he has quit smoking. He does not have any smokeless tobacco history on file. He reports that he drinks about 16.8 ounces of alcohol per week. He reports that he does not use illicit drugs.  Family History: No family history on file.  Physical Exam: Blood pressure 158/91, pulse 80, temperature 98.7 F (37.1 C), temperature source Oral, resp. rate 12, height 5\' 9"  (1.753 m), weight 83.689 kg (184 lb 8 oz), SpO2 99.00%. General: Alert, awake, oriented x3, in no acute distress. HEENT: normocephalic, atraumatic, anicteric sclera, pink conjunctiva, pupils equal and reactive to light and accomodation, oropharynx clear Neck: supple, no masses or lymphadenopathy, no goiter, no bruits  Heart: Regular rate and rhythm, without murmurs, rubs or gallops. Lungs: Clear to auscultation bilaterally, no wheezing, rales or rhonchi. Abdomen: Soft, epigastric tenderness, nondistended, positive bowel sounds, no peritoneal signs Extremities: No clubbing, cyanosis or edema with positive pedal pulses. Neuro: Grossly intact, no focal neurological deficits, strength 5/5 upper and lower extremities bilaterally Psych: alert and oriented x 3, normal mood and affect Skin: no rashes or lesions, warm and dry   LABS on Admission:  Basic Metabolic Panel:  Recent Labs Lab 08/30/14 0032  NA 137  K 4.3  CL 96  CO2 25  GLUCOSE 123*  BUN 21  CREATININE 0.85  CALCIUM 9.6   Liver Function Tests:  Recent Labs Lab 08/30/14 0032  AST 20  ALT 28  ALKPHOS 51  BILITOT 0.7  PROT 8.1  ALBUMIN 4.7    Recent Labs Lab 08/30/14 0032  LIPASE 50   No results found for this basename: AMMONIA,  in the last 168 hours CBC:  Recent Labs Lab 08/30/14 0032  WBC 7.9  NEUTROABS 5.8  HGB 15.4  HCT 42.3  MCV 87.4  PLT 316   Cardiac Enzymes: No results found for this basename: CKTOTAL, CKMB, CKMBINDEX, TROPONINI,  in the last 168 hours BNP: No components found with  this basename: POCBNP,  CBG: No results found for this basename: GLUCAP,  in the last 168 hours   Radiological Exams on Admission: Ct Abdomen Pelvis W Contrast  08/30/2014   CLINICAL DATA:  Chronic pancreatitis. Nausea vomiting. Followup exam.  EXAM: CT ABDOMEN AND PELVIS WITH CONTRAST  TECHNIQUE: Multidetector CT imaging of the abdomen and pelvis was performed using the standard protocol following bolus administration of intravenous contrast.  CONTRAST:  198mL OMNIPAQUE IOHEXOL 300 MG/ML  SOLN  COMPARISON:  CT 07/31/2014.  FINDINGS: No focal hepatic abnormality. Spleen normal. Ill-defined complex pancreatic head mass is again noted. The mass measures 4.9 by 3.5 cm. Central lucency is present suggesting necrosis. This finding is highly suspicious with pancreatic cancer. Adjacent nodular PA pancreatic densities consistent peripancreatic lymph nodes are again noted and unchanged. These measure up to approximately 1 cm. 1 cm retroperitoneal lymph nodes are again noted. No associated biliary distention. Gallbladder is nondistended. Dorsal torsion of the fat planes about the mesenteric vasculature noted with possible narrowing of the SMA. Portal vein is patent.  Adrenals are normal. Kidneys are normal. No hydronephrosis or obstructing ureteral stone. The bladder is nondistended. Prostate is slightly enlarged.  No significant inguinal adenopathy. As noted above 1 cm retroperitoneal adenopathy is present along with peripancreatic adenopathy.  Appendix normal. No bowel distention. No free air. No gastric distention. No significant abdominal wall hernia.  Mild  basilar atelectasis. Heart size normal. No acute bony abnormality. Diffuse degenerative changes lumbar spine, thickening prominent at L5-S1.  IMPRESSION: 1. 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 Stable adjacent peripancreatic and retroperitoneal lymph nodes. SMA involvement cannot be excluded. No evidence of  biliary obstruction. Similar findings noted on prior CT of 07/31/2014. 2. No acute intra-abdominal abnormality identified.   Electronically Signed   By: Marcello Moores  Register   On: 08/30/2014 09:45   Ct Abdomen Pelvis W Contrast  07/31/2014   CLINICAL DATA:  Generalized abdominal pain for the past 2 months. Increased gas. Bloating. Decreased appetite. Postprandial discomfort. Bright red blood in stool 2 months ago. 20 pound weight loss.  EXAM: CT ABDOMEN AND PELVIS WITH CONTRAST  TECHNIQUE: Multidetector CT imaging of the abdomen and pelvis was performed using the standard protocol following bolus administration of intravenous contrast.  CONTRAST:  117mL OMNIPAQUE IOHEXOL 300 MG/ML  SOLN  COMPARISON:  No priors.  FINDINGS: Lung Bases: Unremarkable.  Abdomen/Pelvis: In the inferior aspect of the head and uncinate process of the pancreas there is a heterogeneous appearing hypovascular mass like area measuring 3.5 x 4.9 x 4.2 cm. This has some internal areas of low attenuation which could be cystic or necrotic, but has some peripheral enhancement. This lesion is intimately associated with the third portion of the duodenum, with no discernible intervening fat plane. This is also intimately associated with the superior mesenteric artery which is surrounded by the lesion over nearly 50% of its circumference and slightly distorted as it passes by this lesion. This lesion appears separate from the superior mesenteric vein, with an intact intervening fat plane. This lesion is completely separate from the splenic vein, and proximal portal vein. There are multiple adjacent lymph nodes which are borderline to mildly enlarged, including an 11 mm short axis lymph node immediately anterior to the third portion of the duodenum, a 1 cm short axis right para-aortic lymph node posterior to the duodenum, and a 1 cm short axis portacaval lymph node.  The appearance of the liver, gallbladder, spleen, bilateral adrenal glands and bilateral  kidneys is unremarkable. No significant volume of ascites. No pneumoperitoneum. No pathologic distention of small bowel. Normal appendix (retrocecal). Prostate gland and urinary bladder are unremarkable in appearance.  Musculoskeletal: There are no aggressive appearing lytic or blastic lesions noted in the visualized portions of the skeleton.  IMPRESSION: 1. Findings are highly concerning for a pancreatic neoplasm involving the inferior aspect of the head and uncinate process of the pancreas. There appears to be some early vascular involvement of the proximal superior mesenteric artery, as well as surrounding retroperitoneal lymphadenopathy, as detailed above. Correlation with endoscopic ultrasound and biopsy is strongly recommended in the immediate future for diagnostic purposes. 2. Additional incidental findings, as above. These results will be called to the ordering clinician or representative by the Radiologist Assistant, and communication documented in the PACS or zVision Dashboard.   Electronically Signed   By: Vinnie Langton M.D.   On: 07/31/2014 15:31    Assessment/Plan Principal Problem:   Acute recurrent pancreatitis with abdominal pain - Admit to MedSurg, place on NPO status, aggressive IV fluid hydration, pain control with IV Dilaudid and oxycodone for breakthrough pain - GI (Dr. Paulita Fujita) has been consulted by EDP, who has been considering pseudocyst drainage -Placed on antiemetics  - Patient reports that he has quit drinking last month  Active Problems:   Hypertension - Placed on IV hydralazine as needed with parameters  Abdominal pain - Given patient also reports belching, placed on IV PPI     Pancreatic pseudocyst - Dr. Paulita Fujita has been consulted by EDP, further management for possible pseudocyst drainage to be considered by GI    DVT prophylaxis:  heparin subcutaneous   CODE STATUS:  Full code  Family Communication: Admission, patients condition and plan of care including  tests being ordered have been discussed with the patient who indicates understanding and agree with the plan and Code Status   Further plan will depend as patient's clinical course evolves and further radiologic and laboratory data become available.   Time Spent on Admission: 1 hour  RAI,RIPUDEEP M.D. [facility] 08/30/2014, 11:51 AM Pager: [phone]  If 7PM-7AM, please contact night-coverage [email or web address] Password [email or web address]

## 2014-08-30 NOTE — ED Provider Notes (Signed)
CSN: [id number]     Arrival date & time 08/30/14  0023 History   First MD Initiated Contact with Patient 08/30/14 [phone]     Chief Complaint  Patient presents with  . Abdominal Pain     (Consider location/radiation/quality/duration/timing/severity/associated sxs/prior Treatment) HPI Comments: Patient with previous h/o alcohol use, diagnosis of chronic pancreatitis with pseudocysts in past several months -- presents with worsening of upper abdominal pain. Patient has had significant pain over the past month that has been treated with Percocet 7.5-325. Pain radiates to back. Patient is also experiencing significant nausea and vomiting which is different than his previous symptoms. He has not had fever or diarrhea. Patient is compliant with his medications. He is followed by Dr. Paulita Fujita of GI. He is scheduled for CT of abdomen tomorrow. The onset of this condition was acute. The course is constant. Aggravating factors: none. Alleviating factors: none.    The history is provided by the patient and medical records.    Past Medical History  Diagnosis Date  . Hemorrhoids   . Pancreatitis    Past Surgical History  Procedure Laterality Date  . Colonoscopy    . Eus N/A 08/03/2014    Procedure: ESOPHAGEAL ENDOSCOPIC ULTRASOUND (EUS) RADIAL;  Surgeon: Arta Silence, MD;  Location: [facility];  Service: Endoscopy;  Laterality: N/A;  H&P in file   No family history on file. History  Substance Use Topics  . Smoking status: Former Smoker -- 20 years  . Smokeless tobacco: Not on file  . Alcohol Use: 16.8 oz/week    28 Cans of beer per week    Review of Systems  Constitutional: Negative for fever.  HENT: Negative for rhinorrhea and sore throat.   Eyes: Negative for redness.  Respiratory: Negative for cough.   Cardiovascular: Negative for chest pain.  Gastrointestinal: Positive for nausea, vomiting and abdominal pain. Negative for diarrhea.  Genitourinary: Negative for dysuria.   Musculoskeletal: Negative for myalgias.  Skin: Negative for rash.  Neurological: Negative for headaches.    Allergies  Review of patient's allergies indicates no known allergies.  Home Medications   Prior to Admission medications   Medication Sig Start Date End Date Taking? Authorizing Provider  ALPRAZolam Duanne Moron) 1 MG tablet Take 1 mg by mouth at bedtime as needed for anxiety.   Yes Historical Provider, MD  HYDROcodone-acetaminophen (NORCO/VICODIN) 5-325 MG per tablet Take 1 tablet by mouth every 6 (six) hours as needed for moderate pain.   Yes Historical Provider, MD  ibuprofen (ADVIL,MOTRIN) 200 MG tablet Take 800 mg by mouth every 8 (eight) hours as needed for moderate pain.   Yes Historical Provider, MD  lactose free nutrition (BOOST) LIQD Take 237 mLs by mouth 4 (four) times daily.   Yes Historical Provider, MD  lipase/protease/amylase (CREON) 12000 UNITS CPEP capsule Take 12,000 Units by mouth 3 (three) times daily with meals.   Yes Historical Provider, MD  oxyCODONE-acetaminophen (PERCOCET) 7.5-325 MG per tablet Take 1 tablet by mouth every 6 (six) hours as needed for pain. 08/03/14  Yes Arta Silence, MD  pantoprazole (PROTONIX) 40 MG tablet Take 40 mg by mouth daily.   Yes Historical Provider, MD  Probiotic Product (PROBIOTIC DAILY PO) Take 1 tablet by mouth daily.   Yes Historical Provider, MD   BP 163/101  Pulse 98  Temp(Src) 98.7 F (37.1 C) (Oral)  Resp 21  Ht 5\' 9"  (1.753 m)  Wt 184 lb 8 oz (83.689 kg)  BMI 27.23 kg/m2  SpO2 100%  Physical Exam  Nursing note and vitals reviewed. Constitutional: He appears well-developed and well-nourished.  HENT:  Head: Normocephalic and atraumatic.  Eyes: Conjunctivae are normal. Right eye exhibits no discharge. Left eye exhibits no discharge.  Neck: Normal range of motion. Neck supple.  Cardiovascular: Normal rate, regular rhythm and normal heart sounds.   Pulmonary/Chest: Effort normal and breath sounds normal.  Abdominal:  Soft. He exhibits no distension. There is tenderness in the right upper quadrant, epigastric area and left upper quadrant. There is no rebound and no guarding.  Neurological: He is alert.  Skin: Skin is warm and dry.  Psychiatric: He has a normal mood and affect.    ED Course  Procedures (including critical care time) Labs Review Labs Reviewed  CBC WITH DIFFERENTIAL - Abnormal; Notable for the following:    MCHC 36.4 (*)    RDW 11.3 (*)    All other components within normal limits  COMPREHENSIVE METABOLIC PANEL - Abnormal; Notable for the following:    Glucose, Bld 123 (*)    Anion gap 16 (*)    All other components within normal limits  LIPASE, BLOOD  URINALYSIS, ROUTINE W REFLEX MICROSCOPIC    Imaging Review Ct Abdomen Pelvis W Contrast  08/30/2014   CLINICAL DATA:  Chronic pancreatitis. Nausea vomiting. Followup exam.  EXAM: CT ABDOMEN AND PELVIS WITH CONTRAST  TECHNIQUE: Multidetector CT imaging of the abdomen and pelvis was performed using the standard protocol following bolus administration of intravenous contrast.  CONTRAST:  134mL OMNIPAQUE IOHEXOL 300 MG/ML  SOLN  COMPARISON:  CT 07/31/2014.  FINDINGS: No focal hepatic abnormality. Spleen normal. Ill-defined complex pancreatic head mass is again noted. The mass measures 4.9 by 3.5 cm. Central lucency is present suggesting necrosis. This finding is highly suspicious with pancreatic cancer. Adjacent nodular PA pancreatic densities consistent peripancreatic lymph nodes are again noted and unchanged. These measure up to approximately 1 cm. 1 cm retroperitoneal lymph nodes are again noted. No associated biliary distention. Gallbladder is nondistended. Dorsal torsion of the fat planes about the mesenteric vasculature noted with possible narrowing of the SMA. Portal vein is patent.  Adrenals are normal. Kidneys are normal. No hydronephrosis or obstructing ureteral stone. The bladder is nondistended. Prostate is slightly enlarged.  No  significant inguinal adenopathy. As noted above 1 cm retroperitoneal adenopathy is present along with peripancreatic adenopathy.  Appendix normal. No bowel distention. No free air. No gastric distention. No significant abdominal wall hernia.  Mild basilar atelectasis. Heart size normal. No acute bony abnormality. Diffuse degenerative changes lumbar spine, thickening prominent at L5-S1.  IMPRESSION: 1. 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 Stable adjacent peripancreatic and retroperitoneal lymph nodes. SMA involvement cannot be excluded. No evidence of biliary obstruction. Similar findings noted on prior CT of 07/31/2014. 2. No acute intra-abdominal abnormality identified.   Electronically Signed   By: Marcello Moores  Register   On: 08/30/2014 09:45     EKG Interpretation None      6:21 AM Patient seen and examined. Work-up initiated. Medications ordered.   Vital signs reviewed and are as follows: BP 163/101  Pulse 98  Temp(Src) 98.7 F (37.1 C) (Oral)  Resp 21  Ht 5\' 9"  (1.753 m)  Wt 184 lb 8 oz (83.689 kg)  BMI 27.23 kg/m2  SpO2 100%  7:55 AM Patient in continued pain. Will get CT. Pt discussed with Dr. Jeneen Rinks.    11:04 AM Spoke with Dr. Paulita Fujita. No additional treatment at this point -- he agrees area  appears stable. If pain is uncontrolled and he needs to be admitted, he would consider trying to drain fluid from the cyst for symptom control.   11:29 AM Pt seen by Dr. Jeneen Rinks. Will ask for admission due to intractable pain.   Spoke with Dr. Tana Coast who will admit.   12:41 PM No callback from GI.   MDM   Final diagnoses:  Pseudocyst of pancreas  Intractable abdominal pain   Patient with pain associated with chronic pancreatitis. Pain uncontrolled despite several episodes of parenteral narcotics.     Carlisle Cater, PA-C 08/30/14 1241

## 2014-08-30 NOTE — ED Notes (Signed)
The pt reports that  He just vomited and he is still having pain.  Asked  For wait time   given

## 2014-08-31 ENCOUNTER — Inpatient Hospital Stay: Admission: RE | Admit: 2014-08-31 | Payer: BC Managed Care – PPO | Source: Ambulatory Visit

## 2014-08-31 DIAGNOSIS — K863 Pseudocyst of pancreas: Secondary | ICD-10-CM

## 2014-08-31 LAB — BASIC METABOLIC PANEL WITH GFR
Anion gap: 14 (ref 5–15)
BUN: 11 mg/dL (ref 6–23)
CO2: 26 meq/L (ref 19–32)
Calcium: 9.3 mg/dL (ref 8.4–10.5)
Chloride: 100 meq/L (ref 96–112)
Creatinine, Ser: 0.79 mg/dL (ref 0.50–1.35)
GFR calc Af Amer: >90 mL/min
GFR calc non Af Amer: >90 mL/min
Glucose, Bld: 93 mg/dL (ref 70–99)
Potassium: 4.3 meq/L (ref 3.7–5.3)
Sodium: 140 meq/L (ref 137–147)

## 2014-08-31 LAB — CBC
HCT: 41.2 % (ref 39.0–52.0)
Hemoglobin: 15 g/dL (ref 13.0–17.0)
MCH: 31.7 pg (ref 26.0–34.0)
MCHC: 36.4 g/dL — ABNORMAL HIGH (ref 30.0–36.0)
MCV: 87.1 fL (ref 78.0–100.0)
Platelets: 299 10*3/uL (ref 150–400)
RBC: 4.73 MIL/uL (ref 4.22–5.81)
RDW: 11.4 % — ABNORMAL LOW (ref 11.5–15.5)
WBC: 9 10*3/uL (ref 4.0–10.5)

## 2014-08-31 LAB — CANCER ANTIGEN 19-9: CA 19-9: 169.1 U/mL — ABNORMAL HIGH

## 2014-08-31 LAB — CEA: CEA: 1.8 ng/mL (ref 0.0–5.0)

## 2014-08-31 MED ORDER — SODIUM CHLORIDE 0.9 % IV SOLN
INTRAVENOUS | Status: DC
  Administered 2014-08-31 (×2): via INTRAVENOUS

## 2014-08-31 MED ORDER — FOLIC ACID 1 MG PO TABS
1.0000 mg | ORAL_TABLET | Freq: Every day | ORAL | Status: DC
Start: 2014-08-31 — End: 2014-09-01
  Administered 2014-08-31: 1 mg via ORAL
  Filled 2014-08-31 (×2): qty 1

## 2014-08-31 MED ORDER — VITAMIN B-1 100 MG PO TABS
100.0000 mg | ORAL_TABLET | Freq: Every day | ORAL | Status: DC
  Administered 2014-08-31: 100 mg via ORAL
  Filled 2014-08-31 (×2): qty 1

## 2014-08-31 NOTE — Progress Notes (Signed)
Patient Demographics  Richard Cantu, is a 58 y.o. male, DOB - 1956/03/27, NID:[id number]  Admit date - 08/30/2014   Admitting Physician Richard Krystal Eaton, MD  Outpatient Primary MD for the patient is Richard Coffin, MD  LOS - 1   Chief Complaint  Patient presents with  . Abdominal Pain        Subjective:   [name] today has, No headache, No chest pain, No new weakness tingling or numbness, No Cough - SOB.  Improved but still having epigastric abdominal pain  Assessment & Plan     1. Epigastric abdominal pain due to pancreatic mass suspicious for malignancy versus pseudocyst, history of chronic alcoholic pancreatitis. Continue pain control, bowel rest, IV fluids, GI following likely will require endoscopic ultrasound with biopsy early next week. Lipase is stable. Will try clear liquid diet and monitor.    2. Essential hypertension. As needed IV hydralazine.    3. History of alcohol abuse. Quit one month ago. Place on folic acid and thiamine. Counseled to continue abstaining.    Code Status: full  Family Communication: none present  Disposition Plan: home   Procedures CT abd -Pelvis   Consults  GI - Outlaw   Medications  Scheduled Meds: . heparin  5,000 Units Subcutaneous 3 times per day  . pantoprazole (PROTONIX) IV  40 mg Intravenous Q12H   Continuous Infusions: . sodium chloride 125 mL/hr at 08/31/14 0757   PRN Meds:.acetaminophen, acetaminophen, ALPRAZolam, alum & mag hydroxide-simeth, hydrALAZINE, HYDROmorphone (DILAUDID) injection, ondansetron (ZOFRAN) IV, ondansetron, oxyCODONE, senna-docusate  DVT Prophylaxis    Heparin   Lab Results  Component Value Date   PLT 299 08/31/2014    Antibiotics   Anti-infectives   None          Objective:    Filed Vitals:   08/30/14 1330 08/30/14 1411 08/30/14 2257 08/31/14 0559  BP: 148/94 158/90 143/86 159/81  Pulse: 79 73 74 72  Temp:  97.5 F (36.4 C) 98.3 F (36.8 C) 97.8 F (36.6 C)  TempSrc:  Oral Oral Oral  Resp: 17 17 16 16   Height:      Weight:      SpO2: 94% 99% 100% 100%    Wt Readings from Last 3 Encounters:  08/30/14 83.689 kg (184 lb 8 oz)  08/02/14 87.544 kg (193 lb)  08/02/14 87.544 kg (193 lb)     Intake/Output Summary (Last 24 hours) at 08/31/14 0954 Last data filed at 08/31/14 0900  Gross per 24 hour  Intake 1945.83 ml  Output   2651 ml  Net -705.17 ml     Physical Exam  Awake Alert, Oriented X 3, No new F.N deficits, Normal affect [place].AT,PERRAL Supple Neck,No JVD, No cervical lymphadenopathy appriciated.  Symmetrical Chest wall movement, Good air movement bilaterally, CTAB RRR,No Gallops,Rubs or new Murmurs, No Parasternal Heave +ve B.Sounds, Abd Soft, minimal epigastirc tenderness, No organomegaly appriciated, No rebound - guarding or rigidity. No Cyanosis, Clubbing or edema, No new Rash or bruise      Data Review   Micro Results No results found for this or any previous visit (from the past 240 hour(s)).  Radiology Reports Ct Abdomen Pelvis W Contrast  08/30/2014   CLINICAL DATA:  Chronic pancreatitis. Nausea vomiting. Followup  exam.  EXAM: CT ABDOMEN AND PELVIS WITH CONTRAST  TECHNIQUE: Multidetector CT imaging of the abdomen and pelvis was performed using the standard protocol following bolus administration of intravenous contrast.  CONTRAST:  141mL OMNIPAQUE IOHEXOL 300 MG/ML  SOLN  COMPARISON:  CT 07/31/2014.  FINDINGS: No focal hepatic abnormality. Spleen normal. Ill-defined complex pancreatic head mass is again noted. The mass measures 4.9 by 3.5 cm. Central lucency is present suggesting necrosis. This finding is highly suspicious with pancreatic cancer. Adjacent nodular PA pancreatic densities consistent peripancreatic lymph nodes are  again noted and unchanged. These measure up to approximately 1 cm. 1 cm retroperitoneal lymph nodes are again noted. No associated biliary distention. Gallbladder is nondistended. Dorsal torsion of the fat planes about the mesenteric vasculature noted with possible narrowing of the SMA. Portal vein is patent.  Adrenals are normal. Kidneys are normal. No hydronephrosis or obstructing ureteral stone. The bladder is nondistended. Prostate is slightly enlarged.  No significant inguinal adenopathy. As noted above 1 cm retroperitoneal adenopathy is present along with peripancreatic adenopathy.  Appendix normal. No bowel distention. No free air. No gastric distention. No significant abdominal wall hernia.  Mild basilar atelectasis. Heart size normal. No acute bony abnormality. Diffuse degenerative changes lumbar spine, thickening prominent at L5-S1.  IMPRESSION: 1. 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 Stable adjacent peripancreatic and retroperitoneal lymph nodes. SMA involvement cannot be excluded. No evidence of biliary obstruction. Similar findings noted on prior CT of 07/31/2014. 2. No acute intra-abdominal abnormality identified.   Electronically Signed   By: Marcello Moores  Register   On: 08/30/2014 09:45    Lab Results  Component Value Date   LIPASE 50 08/30/2014    CBC  Recent Labs Lab 08/30/14 0032 08/30/14 1430 08/31/14 0439  WBC 7.9 9.8 9.0  HGB 15.4 15.9 15.0  HCT 42.3 43.0 41.2  PLT 316 306 299  MCV 87.4 88.1 87.1  MCH 31.8 32.6 31.7  MCHC 36.4* 37.0* 36.4*  RDW 11.3* 11.4* 11.4*  LYMPHSABS 1.5  --   --   MONOABS 0.5  --   --   EOSABS 0.1  --   --   BASOSABS 0.0  --   --     Chemistries   Recent Labs Lab 08/30/14 0032 08/30/14 1430 08/31/14 0439  NA 137  --  140  K 4.3  --  4.3  CL 96  --  100  CO2 25  --  26  GLUCOSE 123*  --  93  BUN 21  --  11  CREATININE 0.85 0.72 0.79  CALCIUM 9.6  --  9.3  AST 20  --   --   ALT 28   --   --   ALKPHOS 51  --   --   BILITOT 0.7  --   --    ------------------------------------------------------------------------------------------------------------------ estimated creatinine clearance is 100.6 ml/min (by C-G formula based on Cr of 0.79). ------------------------------------------------------------------------------------------------------------------  Recent Labs  08/30/14 1430  HGBA1C 5.5   ------------------------------------------------------------------------------------------------------------------ No results found for this basename: CHOL, HDL, LDLCALC, TRIG, CHOLHDL, LDLDIRECT,  in the last 72 hours ------------------------------------------------------------------------------------------------------------------ No results found for this basename: TSH, T4TOTAL, FREET3, T3FREE, THYROIDAB,  in the last 72 hours ------------------------------------------------------------------------------------------------------------------ No results found for this basename: VITAMINB12, FOLATE, FERRITIN, TIBC, IRON, RETICCTPCT,  in the last 72 hours  Coagulation profile No results found for this basename: INR, PROTIME,  in the last 168 hours  No results found for this basename: DDIMER,  in the last 72 hours  Cardiac Enzymes No results found for this basename: CK, CKMB, TROPONINI, MYOGLOBIN,  in the last 168 hours ------------------------------------------------------------------------------------------------------------------ No components found with this basename: POCBNP,      Time Spent in minutes   35   Sereena Marando K M.D on 08/31/2014 at 9:54 AM  Between 7am to 7pm - Pager - [phone]  After 7pm go to [email or web address] - password [email or web address]  And look for the night coverage person covering for me after hours  [facility] Office  [phone]

## 2014-08-31 NOTE — Progress Notes (Signed)
Subjective: Pain persists, no significant improvement with prn Dilaudid.  Objective: Vital signs in last 24 hours: Temp:  [97.5 F (36.4 C)-98.3 F (36.8 C)] 97.8 F (36.6 C) (10/09 0559) Pulse Rate:  [72-82] 72 (10/09 0559) Resp:  [11-17] 16 (10/09 0559) BP: (138-159)/(81-98) 159/81 mmHg (10/09 0559) SpO2:  [94 %-100 %] 100 % (10/09 0559) Weight change:  Last BM Date: 08/29/14  PE: GEN:  NAD ABD:  Soft, generalized tenderness, worse in epigastrium, no peritonitis  Lab Results: Ca 19-9 169 CEA normal CBC    Component Value Date/Time   WBC 9.0 08/31/2014 0439   RBC 4.73 08/31/2014 0439   HGB 15.0 08/31/2014 0439   HCT 41.2 08/31/2014 0439   PLT 299 08/31/2014 0439   MCV 87.1 08/31/2014 0439   MCH 31.7 08/31/2014 0439   MCHC 36.4* 08/31/2014 0439   RDW 11.4* 08/31/2014 0439   LYMPHSABS 1.5 08/30/2014 0032   MONOABS 0.5 08/30/2014 0032   EOSABS 0.1 08/30/2014 0032   BASOSABS 0.0 08/30/2014 0032   CMP     Component Value Date/Time   NA 140 08/31/2014 0439   K 4.3 08/31/2014 0439   CL 100 08/31/2014 0439   CO2 26 08/31/2014 0439   GLUCOSE 93 08/31/2014 0439   BUN 11 08/31/2014 0439   CREATININE 0.79 08/31/2014 0439   CALCIUM 9.3 08/31/2014 0439   PROT 8.1 08/30/2014 0032   ALBUMIN 4.7 08/30/2014 0032   AST 20 08/30/2014 0032   ALT 28 08/30/2014 0032   ALKPHOS 51 08/30/2014 0032   BILITOT 0.7 08/30/2014 0032   GFRNONAA >90 08/31/2014 0439   GFRAA >90 08/31/2014 0439   Assessment:  1. Abdominal pain. Persistent, no significant interval improvement. 2. Weight loss.  3. Pancreatic mass on CT. As per yesterday's consult note, there is no appreciable change in size over the past one month. On recent endoscopic ultrasound, lesion appears most consistent with pseudocyst or fluid collection, which it certainly still could be. Alternatively, patient could have a near-completely necrotic adenocarcinoma, an aggressive cystic pancreatic neoplasm, or even a cystic neuroendocrine  lesion.  Plan:  1.  Given elevated Ca 19-9, I'm more worried about tumor with extensive liquefactive necrosis (which can mimic cyst on ultrasound) as well as aggressive pancreatic cystic neoplasm. 2.  Would focus on pain control at the present time, increase interval of dilaudid, may even need PCA pump. 3.  Sips of ice chips for now. 4.  I will work on getting patient expedited EUS (to aspirate cystic fluid and consider celiac plexus block) to be done early next week, hopefully Tuesday.  If pain control has improved, he can be discharged beforehand and he have procedure done as inpatient, otherwise he can proceed with this procedure as an inpatient. 5.  [facility] will follow.   Gleen Ripberger M 08/31/2014, 10:01 AM

## 2014-08-31 NOTE — ED Provider Notes (Signed)
Medical screening examination/treatment/procedure(s) were performed by non-physician practitioner and as supervising physician I was immediately available for consultation/collaboration.   Artis Delay, MD 08/31/14 2107

## 2014-09-01 ENCOUNTER — Encounter (HOSPITAL_COMMUNITY): Payer: Self-pay | Admitting: General Practice

## 2014-09-01 LAB — PROTIME-INR
INR: 1.14 (ref 0.00–1.49)
Prothrombin Time: 14.6 seconds (ref 11.6–15.2)

## 2014-09-01 MED ORDER — PANTOPRAZOLE SODIUM 40 MG PO TBEC
40.0000 mg | DELAYED_RELEASE_TABLET | Freq: Every day | ORAL | Status: DC
  Administered 2014-09-02 – 2014-09-08 (×7): 40 mg via ORAL
  Filled 2014-09-01 (×7): qty 1

## 2014-09-01 MED ORDER — THIAMINE HCL 100 MG/ML IJ SOLN
100.0000 mg | Freq: Every day | INTRAMUSCULAR | Status: DC
  Administered 2014-09-01 – 2014-09-04 (×4): 100 mg via INTRAVENOUS
  Filled 2014-09-01: qty 1
  Filled 2014-09-01: qty 2
  Filled 2014-09-01 (×3): qty 1

## 2014-09-01 MED ORDER — SODIUM CHLORIDE 0.9 % IV SOLN: INTRAVENOUS | Status: DC

## 2014-09-01 MED ORDER — SODIUM CHLORIDE 0.9 % IV SOLN
INTRAVENOUS | Status: DC
  Administered 2014-09-01: 1000 mL via INTRAVENOUS
  Administered 2014-09-01 – 2014-09-03 (×3): via INTRAVENOUS

## 2014-09-01 MED ORDER — MORPHINE SULFATE ER 30 MG PO TBCR
30.0000 mg | EXTENDED_RELEASE_TABLET | Freq: Two times a day (BID) | ORAL | Status: DC
Start: 2014-09-01 — End: 2014-09-01

## 2014-09-01 MED ORDER — HYDROMORPHONE HCL 1 MG/ML IJ SOLN
2.0000 mg | INTRAMUSCULAR | Status: DC | PRN
  Administered 2014-09-01 – 2014-09-06 (×24): 2 mg via INTRAVENOUS
  Filled 2014-09-01 (×27): qty 2

## 2014-09-01 MED ORDER — MORPHINE SULFATE ER 30 MG PO TBCR
60.0000 mg | EXTENDED_RELEASE_TABLET | Freq: Two times a day (BID) | ORAL | Status: DC
  Administered 2014-09-01 – 2014-09-06 (×11): 60 mg via ORAL
  Filled 2014-09-01 (×11): qty 4

## 2014-09-01 MED ORDER — FOLIC ACID 5 MG/ML IJ SOLN
1.0000 mg | Freq: Every day | INTRAMUSCULAR | Status: DC
  Administered 2014-09-01 – 2014-09-04 (×4): 1 mg via INTRAVENOUS
  Filled 2014-09-01 (×5): qty 0.2

## 2014-09-01 MED ORDER — BOOST / RESOURCE BREEZE PO LIQD: 1.0000 | ORAL | Status: DC | PRN

## 2014-09-01 NOTE — Progress Notes (Signed)
Patient Demographics  Richard Cantu, is a 58 y.o. male, DOB - Mar 16, 1956, PQZ:[id number]  Admit date - 08/30/2014   Admitting Physician Ripudeep Krystal Eaton, MD  Outpatient Primary MD for the patient is Donnie Coffin, MD  LOS - 2   Chief Complaint  Patient presents with  . Abdominal Pain        Subjective:   [facility] today has, No headache, No chest pain, No new weakness tingling or numbness, No Cough - SOB.  Improved but still having epigastric abdominal pain  Assessment & Plan     1. Epigastric abdominal pain due to pancreatic mass suspicious for malignancy with elevated CA 19.9 -  history of chronic alcoholic pancreatitis. Continue pain control, bowel rest, IV fluids, GI following likely will require endoscopic ultrasound with biopsy early next week. Lipase is stable. Continue clear liquids. Have her just a pain regimen for better control.    2. Essential hypertension. As needed IV hydralazine.    3. History of alcohol abuse. Quit one month ago. Place on folic acid and thiamine. Counseled to continue abstaining.    Code Status: full  Family Communication: none present  Disposition Plan: home   Procedures CT abd -Pelvis   Consults  GI - Outlaw   Medications  Scheduled Meds: . folic acid  1 mg Oral Daily  . heparin  5,000 Units Subcutaneous 3 times per day  . morphine  60 mg Oral Q12H  . pantoprazole  40 mg Oral Daily  . thiamine  100 mg Oral Daily   Continuous Infusions: . sodium chloride     PRN Meds:.acetaminophen, ALPRAZolam, alum & mag hydroxide-simeth, hydrALAZINE, HYDROmorphone (DILAUDID) injection, ondansetron (ZOFRAN) IV, senna-docusate  DVT Prophylaxis    Heparin   Lab Results  Component Value Date   PLT 299 08/31/2014    Antibiotics    Anti-infectives   None          Objective:   Filed Vitals:   08/31/14 0559 08/31/14 1400 08/31/14 2219 09/01/14 0513  BP: 159/81 140/83 161/83 145/91  Pulse: 72 70 67 66  Temp: 97.8 F (36.6 C) 98.1 F (36.7 C) 97.9 F (36.6 C) 98.2 F (36.8 C)  TempSrc: Oral Oral Oral Oral  Resp: 16 16 16 16   Height:      Weight:      SpO2: 100% 98% 98% 97%    Wt Readings from Last 3 Encounters:  08/30/14 83.689 kg (184 lb 8 oz)  08/02/14 87.544 kg (193 lb)  08/02/14 87.544 kg (193 lb)     Intake/Output Summary (Last 24 hours) at 09/01/14 1015 Last data filed at 09/01/14 0546  Gross per 24 hour  Intake   1200 ml  Output   2850 ml  Net  -1650 ml     Physical Exam  Awake Alert, Oriented X 3, No new F.N deficits, Normal affect [place].AT,PERRAL Supple Neck,No JVD, No cervical lymphadenopathy appriciated.  Symmetrical Chest wall movement, Good air movement bilaterally, CTAB RRR,No Gallops,Rubs or new Murmurs, No Parasternal Heave +ve B.Sounds, Abd Soft, minimal epigastirc tenderness, No organomegaly appriciated, No rebound - guarding or rigidity. No Cyanosis, Clubbing or edema, No new Rash or bruise      Data Review   Micro Results No results  found for this or any previous visit (from the past 240 hour(s)).  Radiology Reports Ct Abdomen Pelvis W Contrast  08/30/2014   CLINICAL DATA:  Chronic pancreatitis. Nausea vomiting. Followup exam.  EXAM: CT ABDOMEN AND PELVIS WITH CONTRAST  TECHNIQUE: Multidetector CT imaging of the abdomen and pelvis was performed using the standard protocol following bolus administration of intravenous contrast.  CONTRAST:  169mL OMNIPAQUE IOHEXOL 300 MG/ML  SOLN  COMPARISON:  CT 07/31/2014.  FINDINGS: No focal hepatic abnormality. Spleen normal. Ill-defined complex pancreatic head mass is again noted. The mass measures 4.9 by 3.5 cm. Central lucency is present suggesting necrosis. This finding is highly suspicious with pancreatic cancer. Adjacent  nodular PA pancreatic densities consistent peripancreatic lymph nodes are again noted and unchanged. These measure up to approximately 1 cm. 1 cm retroperitoneal lymph nodes are again noted. No associated biliary distention. Gallbladder is nondistended. Dorsal torsion of the fat planes about the mesenteric vasculature noted with possible narrowing of the SMA. Portal vein is patent.  Adrenals are normal. Kidneys are normal. No hydronephrosis or obstructing ureteral stone. The bladder is nondistended. Prostate is slightly enlarged.  No significant inguinal adenopathy. As noted above 1 cm retroperitoneal adenopathy is present along with peripancreatic adenopathy.  Appendix normal. No bowel distention. No free air. No gastric distention. No significant abdominal wall hernia.  Mild basilar atelectasis. Heart size normal. No acute bony abnormality. Diffuse degenerative changes lumbar spine, thickening prominent at L5-S1.  IMPRESSION: 1. 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 Stable adjacent peripancreatic and retroperitoneal lymph nodes. SMA involvement cannot be excluded. No evidence of biliary obstruction. Similar findings noted on prior CT of 07/31/2014. 2. No acute intra-abdominal abnormality identified.   Electronically Signed   By: Marcello Moores  Register   On: 08/30/2014 09:45    Lab Results  Component Value Date   LIPASE 50 08/30/2014    CBC  Recent Labs Lab 08/30/14 0032 08/30/14 1430 08/31/14 0439  WBC 7.9 9.8 9.0  HGB 15.4 15.9 15.0  HCT 42.3 43.0 41.2  PLT 316 306 299  MCV 87.4 88.1 87.1  MCH 31.8 32.6 31.7  MCHC 36.4* 37.0* 36.4*  RDW 11.3* 11.4* 11.4*  LYMPHSABS 1.5  --   --   MONOABS 0.5  --   --   EOSABS 0.1  --   --   BASOSABS 0.0  --   --     Chemistries   Recent Labs Lab 08/30/14 0032 08/30/14 1430 08/31/14 0439  NA 137  --  140  K 4.3  --  4.3  CL 96  --  100  CO2 25  --  26  GLUCOSE 123*  --  93  BUN 21  --  11   CREATININE 0.85 0.72 0.79  CALCIUM 9.6  --  9.3  AST 20  --   --   ALT 28  --   --   ALKPHOS 51  --   --   BILITOT 0.7  --   --    ------------------------------------------------------------------------------------------------------------------ estimated creatinine clearance is 100.6 ml/min (by C-G formula based on Cr of 0.79). ------------------------------------------------------------------------------------------------------------------  Recent Labs  08/30/14 1430  HGBA1C 5.5   ------------------------------------------------------------------------------------------------------------------ No results found for this basename: CHOL, HDL, LDLCALC, TRIG, CHOLHDL, LDLDIRECT,  in the last 72 hours ------------------------------------------------------------------------------------------------------------------ No results found for this basename: TSH, T4TOTAL, FREET3, T3FREE, THYROIDAB,  in the last 72 hours ------------------------------------------------------------------------------------------------------------------ No results found for this basename: VITAMINB12, FOLATE, FERRITIN, TIBC, IRON, RETICCTPCT,  in the last 72 hours  Coagulation profile No results found for this basename: INR, PROTIME,  in the last 168 hours  No results found for this basename: DDIMER,  in the last 72 hours  Cardiac Enzymes No results found for this basename: CK, CKMB, TROPONINI, MYOGLOBIN,  in the last 168 hours ------------------------------------------------------------------------------------------------------------------ No components found with this basename: POCBNP,      Time Spent in minutes   35   Mykala Mccready K M.D on 09/01/2014 at 10:15 AM  Between 7am to 7pm - Pager - [phone]  After 7pm go to [email or web address] - password [email or web address]  And look for the night coverage person covering for me after hours  [facility] Office  [phone]

## 2014-09-01 NOTE — Progress Notes (Signed)
[facility] Progress Note  Subjective: Pain still rated as an 8 on a scale of 1-10 tolerating clear liquid diet to some degree  Objective: Vital signs in last 24 hours: Temp:  [97.9 F (36.6 Cantu)-98.2 F (36.8 Cantu)] 98.2 F (36.8 Cantu) (10/10 0513) Pulse Rate:  [66-70] 66 (10/10 0513) Resp:  [16] 16 (10/10 0513) BP: (140-161)/(83-91) 145/91 mmHg (10/10 0513) SpO2:  [97 %-98 %] 97 % (10/10 0513) Weight change:    PE: Persistent epigastric tenderness  Lab Results: No results found for this or any previous visit (from the past 24 hour(s)).  Studies/Results: Ct Abdomen Pelvis W Contrast  08/30/2014   CLINICAL DATA:  Chronic pancreatitis. Nausea vomiting. Followup exam.  EXAM: CT ABDOMEN AND PELVIS WITH CONTRAST  TECHNIQUE: Multidetector CT imaging of the abdomen and pelvis was performed using the standard protocol following bolus administration of intravenous contrast.  CONTRAST:  193mL OMNIPAQUE IOHEXOL 300 MG/ML  SOLN  COMPARISON:  CT 07/31/2014.  FINDINGS: No focal hepatic abnormality. Spleen normal. Ill-defined complex pancreatic head mass is again noted. The mass measures 4.9 by 3.5 cm. Central lucency is present suggesting necrosis. This finding is highly suspicious with pancreatic cancer. Adjacent nodular PA pancreatic densities consistent peripancreatic lymph nodes are again noted and unchanged. These measure up to approximately 1 cm. 1 cm retroperitoneal lymph nodes are again noted. No associated biliary distention. Gallbladder is nondistended. Dorsal torsion of the fat planes about the mesenteric vasculature noted with possible narrowing of the SMA. Portal vein is patent.  Adrenals are normal. Kidneys are normal. No hydronephrosis or obstructing ureteral stone. The bladder is nondistended. Prostate is slightly enlarged.  No significant inguinal adenopathy. As noted above 1 cm retroperitoneal adenopathy is present along with peripancreatic adenopathy.  Appendix normal. No bowel  distention. No free air. No gastric distention. No significant abdominal wall hernia.  Mild basilar atelectasis. Heart size normal. No acute bony abnormality. Diffuse degenerative changes lumbar spine, thickening prominent at L5-S1.  IMPRESSION: 1. 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 Stable adjacent peripancreatic and retroperitoneal lymph nodes. SMA involvement cannot be excluded. No evidence of biliary obstruction. Similar findings noted on prior CT of 07/31/2014. 2. No acute intra-abdominal abnormality identified.   Electronically Signed   By: Marcello Moores  Register   On: 08/30/2014 09:45      1. Assessment:Pancreatic head cyst versus pseudocyst versus necrotic mass, slightly elevated CA 19-9  Plan: 1. Continue to work on pain control consider PCA 2.  Dr. Paulita Fujita to perform EUS with guided biopsy aspiration early next week.    Richard Cantu 09/01/2014, 7:49 AM

## 2014-09-01 NOTE — Progress Notes (Signed)
INITIAL NUTRITION ASSESSMENT  DOCUMENTATION CODES Per approved criteria  -Not Applicable   INTERVENTION: Resource Breeze po PRN, each supplement provides 250 kcal and 9 grams of protein  If pt unable to advance diet/tolerate diet may need to consider enteral nutrition support with feeding tube beyond the Ligament of [place] as pt at risk for malnutrition.   NUTRITION DIAGNOSIS: Increased nutrient needs related to recurrent pancreatitis as evidenced by estimated needs.   Goal: Pt to meet >/= 90% of their estimated nutrition needs   Monitor:  Diet advancement, PO intake, supplement acceptance   Reason for Assessment: Pt identified as at nutrition risk on the Malnutrition Screen Tool  58 y.o. male  Admitting Dx: Acute recurrent pancreatitis  ASSESSMENT: Pt with hx of alcohol abuse who quit in 9/15. Pt with chronic pancreatitis with pseudocyst who was admitted with worsening upper abd pain. Pt with N/V PTA.  Per GI pt with pancreatic head cyst vs pseudocyst vs necrotic mass. Plan for EUS with guided biopsy aspiration next week.   Per pt he has lost 4% of his body weight in the last month due to pain and nausea. Pt attempted to eat Jell-O this am and has severe pain which is just now being controlled. Pt does not feel he is ready for supplements, will order PRN so that when his pain is better he may request them.  No signs fat or muscle depletion. Pt has had weight loss in his arms.   Height: Ht Readings from Last 1 Encounters:  08/30/14 5\' 9"  (1.753 m)    Weight: Wt Readings from Last 1 Encounters:  08/30/14 184 lb 8 oz (83.689 kg)    Ideal Body Weight: 72.7 kg   % Ideal Body Weight: 115%  Wt Readings from Last 10 Encounters:  08/30/14 184 lb 8 oz (83.689 kg)  08/02/14 193 lb (87.544 kg)  08/02/14 193 lb (87.544 kg)    Usual Body Weight: 193 lb   % Usual Body Weight: 96%  BMI:  Body mass index is 27.23 kg/(m^2).  Estimated Nutritional Needs: Kcal:  2200-2400 Protein: 125-135 grams Fluid: > 2.2 L/day  Skin: WDL  Diet Order: Clear Liquid  EDUCATION NEEDS: -No education needs identified at this time   Intake/Output Summary (Last 24 hours) at 09/01/14 0945 Last data filed at 09/01/14 0546  Gross per 24 hour  Intake   1200 ml  Output   2850 ml  Net  -1650 ml    Last BM: 10/7   Labs:   Recent Labs Lab 08/30/14 0032 08/30/14 1430 08/31/14 0439  NA 137  --  140  K 4.3  --  4.3  CL 96  --  100  CO2 25  --  26  BUN 21  --  11  CREATININE 0.85 0.72 0.79  CALCIUM 9.6  --  9.3  GLUCOSE 123*  --  93    CBG (last 3)  No results found for this basename: GLUCAP,  in the last 72 hours  Scheduled Meds: . folic acid  1 mg Oral Daily  . heparin  5,000 Units Subcutaneous 3 times per day  . pantoprazole (PROTONIX) IV  40 mg Intravenous Q12H  . thiamine  100 mg Oral Daily    Continuous Infusions: . sodium chloride 75 mL/hr at 09/01/14 0845    Past Medical History  Diagnosis Date  . Hemorrhoids   . Chronic pancreatitis   . Pancreatic cyst dx'd 08/03/2014  . History of gout   . Anxiety  Past Surgical History  Procedure Laterality Date  . Colonoscopy    . Eus N/A 08/03/2014    Procedure: ESOPHAGEAL ENDOSCOPIC ULTRASOUND (EUS) RADIAL;  Surgeon: Arta Silence, MD;  Location: [facility];  Service: Endoscopy;  Laterality: N/A;  H&P in file    [place], [place], [place] Pager [phone] After Hours Pager

## 2014-09-02 LAB — COMPREHENSIVE METABOLIC PANEL
ALT: 20 U/L (ref 0–53)
AST: 19 U/L (ref 0–37)
Albumin: 4 g/dL (ref 3.5–5.2)
Alkaline Phosphatase: 45 U/L (ref 39–117)
Anion gap: 12 (ref 5–15)
BUN: 16 mg/dL (ref 6–23)
CALCIUM: 8.8 mg/dL (ref 8.4–10.5)
CO2: 24 meq/L (ref 19–32)
CREATININE: 0.82 mg/dL (ref 0.50–1.35)
Chloride: 101 mEq/L (ref 96–112)
Glucose, Bld: 104 mg/dL — ABNORMAL HIGH (ref 70–99)
Potassium: 3.5 mEq/L — ABNORMAL LOW (ref 3.7–5.3)
Sodium: 137 mEq/L (ref 137–147)
Total Bilirubin: 0.8 mg/dL (ref 0.3–1.2)
Total Protein: 6.7 g/dL (ref 6.0–8.3)

## 2014-09-02 LAB — LIPASE, BLOOD: LIPASE: 102 U/L — AB (ref 11–59)

## 2014-09-02 MED ORDER — HYDROCODONE-ACETAMINOPHEN 5-325 MG PO TABS
1.0000 | ORAL_TABLET | ORAL | Status: DC | PRN
  Administered 2014-09-02 – 2014-09-06 (×16): 1 via ORAL
  Filled 2014-09-02 (×18): qty 1

## 2014-09-02 NOTE — Progress Notes (Signed)
Patient Demographics  Richard Cantu, is a 58 y.o. male, DOB - Jan 28, 1956, YBO:[id number]  Admit date - 08/30/2014   Admitting Physician Ripudeep Krystal Eaton, MD  Outpatient Primary MD for the patient is Donnie Coffin, MD  LOS - 3   Chief Complaint  Patient presents with  . Abdominal Pain        Subjective:   [facility] today has, No headache, No chest pain, No new weakness tingling or numbness, No Cough - SOB.  Improved but still having epigastric abdominal pain  Assessment & Plan     1. Epigastric abdominal pain due to pancreatic mass suspicious for malignancy with elevated CA 19.9 -  history of chronic alcoholic pancreatitis. Continue pain control, bowel rest, IV fluids, GI following likely will require endoscopic ultrasound with biopsy early next week. Lipase is stable. Continue clear liquids. Have adjusted his pain regimen with good effect he feels much better.    2. Essential hypertension. As needed IV hydralazine.    3. History of alcohol abuse. Quit one month ago. Place on folic acid and thiamine. Counseled to continue abstaining.    Code Status: full  Family Communication: none present  Disposition Plan: home   Procedures CT abd -Pelvis   Consults  GI - Outlaw   Medications  Scheduled Meds: . folic acid  1 mg Intravenous Daily  . heparin  5,000 Units Subcutaneous 3 times per day  . morphine  60 mg Oral Q12H  . pantoprazole  40 mg Oral Daily  . thiamine IV  100 mg Intravenous Daily   Continuous Infusions: . sodium chloride 1,000 mL (09/01/14 2218)   PRN Meds:.acetaminophen, ALPRAZolam, alum & mag hydroxide-simeth, feeding supplement (RESOURCE BREEZE), hydrALAZINE, HYDROmorphone (DILAUDID) injection, ondansetron (ZOFRAN) IV, senna-docusate  DVT Prophylaxis     Heparin   Lab Results  Component Value Date   PLT 299 08/31/2014    Antibiotics   Anti-infectives   None          Objective:   Filed Vitals:   09/01/14 0513 09/01/14 1410 09/01/14 2218 09/02/14 0649  BP: 145/91 143/87 137/89 145/81  Pulse: 66 75 100 69  Temp: 98.2 F (36.8 C) 98 F (36.7 C) 97.7 F (36.5 C) 97.9 F (36.6 C)  TempSrc: Oral Oral Oral Oral  Resp: 16 18 20 18   Height:      Weight:      SpO2: 97% 99% 97% 98%    Wt Readings from Last 3 Encounters:  08/30/14 83.689 kg (184 lb 8 oz)  08/02/14 87.544 kg (193 lb)  08/02/14 87.544 kg (193 lb)     Intake/Output Summary (Last 24 hours) at 09/02/14 0915 Last data filed at 09/02/14 0810  Gross per 24 hour  Intake    620 ml  Output   1150 ml  Net   -530 ml     Physical Exam  Awake Alert, Oriented X 3, No new F.N deficits, Normal affect [name].AT,PERRAL Supple Neck,No JVD, No cervical lymphadenopathy appriciated.  Symmetrical Chest wall movement, Good air movement bilaterally, CTAB RRR,No Gallops,Rubs or new Murmurs, No Parasternal Heave +ve B.Sounds, Abd Soft, minimal epigastirc tenderness, No organomegaly appriciated, No rebound - guarding or rigidity. No Cyanosis, Clubbing or edema, No new Rash or bruise  Data Review   Micro Results No results found for this or any previous visit (from the past 240 hour(s)).  Radiology Reports Ct Abdomen Pelvis W Contrast  08/30/2014   CLINICAL DATA:  Chronic pancreatitis. Nausea vomiting. Followup exam.  EXAM: CT ABDOMEN AND PELVIS WITH CONTRAST  TECHNIQUE: Multidetector CT imaging of the abdomen and pelvis was performed using the standard protocol following bolus administration of intravenous contrast.  CONTRAST:  139mL OMNIPAQUE IOHEXOL 300 MG/ML  SOLN  COMPARISON:  CT 07/31/2014.  FINDINGS: No focal hepatic abnormality. Spleen normal. Ill-defined complex pancreatic head mass is again noted. The mass measures 4.9 by 3.5 cm. Central lucency is present  suggesting necrosis. This finding is highly suspicious with pancreatic cancer. Adjacent nodular PA pancreatic densities consistent peripancreatic lymph nodes are again noted and unchanged. These measure up to approximately 1 cm. 1 cm retroperitoneal lymph nodes are again noted. No associated biliary distention. Gallbladder is nondistended. Dorsal torsion of the fat planes about the mesenteric vasculature noted with possible narrowing of the SMA. Portal vein is patent.  Adrenals are normal. Kidneys are normal. No hydronephrosis or obstructing ureteral stone. The bladder is nondistended. Prostate is slightly enlarged.  No significant inguinal adenopathy. As noted above 1 cm retroperitoneal adenopathy is present along with peripancreatic adenopathy.  Appendix normal. No bowel distention. No free air. No gastric distention. No significant abdominal wall hernia.  Mild basilar atelectasis. Heart size normal. No acute bony abnormality. Diffuse degenerative changes lumbar spine, thickening prominent at L5-S1.  IMPRESSION: 1. 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 Stable adjacent peripancreatic and retroperitoneal lymph nodes. SMA involvement cannot be excluded. No evidence of biliary obstruction. Similar findings noted on prior CT of 07/31/2014. 2. No acute intra-abdominal abnormality identified.   Electronically Signed   By: Marcello Moores  Register   On: 08/30/2014 09:45    Lab Results  Component Value Date   LIPASE 50 08/30/2014    CBC  Recent Labs Lab 08/30/14 0032 08/30/14 1430 08/31/14 0439  WBC 7.9 9.8 9.0  HGB 15.4 15.9 15.0  HCT 42.3 43.0 41.2  PLT 316 306 299  MCV 87.4 88.1 87.1  MCH 31.8 32.6 31.7  MCHC 36.4* 37.0* 36.4*  RDW 11.3* 11.4* 11.4*  LYMPHSABS 1.5  --   --   MONOABS 0.5  --   --   EOSABS 0.1  --   --   BASOSABS 0.0  --   --     Chemistries   Recent Labs Lab 08/30/14 0032 08/30/14 1430 08/31/14 0439  NA 137  --  140  K 4.3   --  4.3  CL 96  --  100  CO2 25  --  26  GLUCOSE 123*  --  93  BUN 21  --  11  CREATININE 0.85 0.72 0.79  CALCIUM 9.6  --  9.3  AST 20  --   --   ALT 28  --   --   ALKPHOS 51  --   --   BILITOT 0.7  --   --    ------------------------------------------------------------------------------------------------------------------ estimated creatinine clearance is 100.6 ml/min (by C-G formula based on Cr of 0.79). ------------------------------------------------------------------------------------------------------------------  Recent Labs  08/30/14 1430  HGBA1C 5.5   ------------------------------------------------------------------------------------------------------------------ No results found for this basename: CHOL, HDL, LDLCALC, TRIG, CHOLHDL, LDLDIRECT,  in the last 72 hours ------------------------------------------------------------------------------------------------------------------ No results found for this basename: TSH, T4TOTAL, FREET3, T3FREE, THYROIDAB,  in the last 72 hours ------------------------------------------------------------------------------------------------------------------ No results found for  this basename: VITAMINB12, FOLATE, FERRITIN, TIBC, IRON, RETICCTPCT,  in the last 72 hours  Coagulation profile  Recent Labs Lab 09/01/14 1100  INR 1.14    No results found for this basename: DDIMER,  in the last 72 hours  Cardiac Enzymes No results found for this basename: CK, CKMB, TROPONINI, MYOGLOBIN,  in the last 168 hours ------------------------------------------------------------------------------------------------------------------ No components found with this basename: POCBNP,      Time Spent in minutes   35   SINGH,PRASHANT K M.D on 09/02/2014 at 9:15 AM  Between 7am to 7pm - Pager - [phone]  After 7pm go to [email or web address] - password [email or web address]  And look for the night coverage person covering for me after hours  [facility]  Group Office  [phone]

## 2014-09-02 NOTE — Progress Notes (Signed)
[facility] Progress Note  Subjective: Alert oriented pain somewhat worse yesterday responded to increase and a lot of to 2 mg every 2 hours when necessary. Most recent lipase and liver function tests normal  Objective: Vital signs in last 24 hours: Temp:  [97.7 F (36.5 C)-98 F (36.7 C)] 97.9 F (36.6 C) (10/11 0649) Pulse Rate:  [69-100] 69 (10/11 0649) Resp:  [18-20] 18 (10/11 0649) BP: (137-145)/(81-89) 145/81 mmHg (10/11 0649) SpO2:  [97 %-99 %] 98 % (10/11 0649) Weight change:    PE: Mild epigastric pain no palpable mass  Lab Results: Results for orders placed during the hospital encounter of 08/30/14 (from the past 24 hour(s))  PROTIME-INR     Status: None   Collection Time    09/01/14 11:00 AM      Result Value Ref Range   Prothrombin Time 14.6  11.6 - 15.2 seconds   INR 1.14  0.00 - 1.49    Studies/Results: No results found.    Assessment: Pancreatic mass with elevated CA 19-9 still with significant pain  Plan: Continue clear liquid diet, will recheck lipase and liver function tests. Dr. Paulita Fujita to schedule endoscopic ultrasound with guided biopsy early next week.    Ellenie Salome C 09/02/2014, 7:59 AM

## 2014-09-03 LAB — COMPREHENSIVE METABOLIC PANEL
ALBUMIN: 4 g/dL (ref 3.5–5.2)
ALT: 24 U/L (ref 0–53)
AST: 21 U/L (ref 0–37)
Alkaline Phosphatase: 48 U/L (ref 39–117)
Anion gap: 13 (ref 5–15)
BUN: 9 mg/dL (ref 6–23)
CO2: 27 mEq/L (ref 19–32)
Calcium: 9.1 mg/dL (ref 8.4–10.5)
Chloride: 100 mEq/L (ref 96–112)
Creatinine, Ser: 0.79 mg/dL (ref 0.50–1.35)
GFR calc Af Amer: >90 mL/min (ref >90)
GFR calc non Af Amer: >90 mL/min (ref >90)
GLUCOSE: 88 mg/dL (ref 70–99)
POTASSIUM: 3.6 meq/L — AB (ref 3.7–5.3)
Sodium: 140 mEq/L (ref 137–147)
TOTAL PROTEIN: 6.9 g/dL (ref 6.0–8.3)
Total Bilirubin: 0.7 mg/dL (ref 0.3–1.2)

## 2014-09-03 MED ORDER — POTASSIUM CHLORIDE IN NACL 40-0.9 MEQ/L-% IV SOLN
INTRAVENOUS | Status: AC
  Administered 2014-09-03 – 2014-09-04 (×2): 75 mL/h via INTRAVENOUS
  Filled 2014-09-03 (×2): qty 1000

## 2014-09-03 NOTE — Anesthesia Preprocedure Evaluation (Signed)
Anesthesia Evaluation  Patient identified by MRN, date of birth, ID band Patient awake    Reviewed: Allergy & Precautions, H&P , NPO status , Patient's Chart, lab work & pertinent test results  History of Anesthesia Complications Negative for: history of anesthetic complications  Airway Mallampati: II TM Distance: >3 FB Neck ROM: Full    Dental no notable dental hx.    Pulmonary former smoker,  breath sounds clear to auscultation  Pulmonary exam normal       Cardiovascular hypertension, Pt. on medications Rhythm:Regular Rate:Normal     Neuro/Psych negative neurological ROS  negative psych ROS   GI/Hepatic negative GI ROS, Neg liver ROS,   Endo/Other  negative endocrine ROS  Renal/GU negative Renal ROS  negative genitourinary   Musculoskeletal negative musculoskeletal ROS (+)   Abdominal   Peds negative pediatric ROS (+)  Hematology negative hematology ROS (+)   Anesthesia Other Findings Pancreatic mass  Reproductive/Obstetrics negative OB ROS                           Anesthesia Physical Anesthesia Plan  ASA: II  Anesthesia Plan: MAC   Post-op Pain Management:    Induction: Intravenous  Airway Management Planned: Nasal Cannula  Additional Equipment:   Intra-op Plan:   Post-operative Plan: Extubation in OR  Informed Consent: I have reviewed the patients History and Physical, chart, labs and discussed the procedure including the risks, benefits and alternatives for the proposed anesthesia with the patient or authorized representative who has indicated his/her understanding and acceptance.   Dental advisory given  Plan Discussed with: CRNA  Anesthesia Plan Comments:         Anesthesia Quick Evaluation

## 2014-09-03 NOTE — Progress Notes (Signed)
Patient Demographics  Richard Cantu, is a 58 y.o. male, DOB - [birth year], SUP:[id number]  Admit date - 08/30/2014   Admitting Physician Ripudeep Krystal Eaton, MD  Outpatient Primary MD for the patient is Donnie Coffin, MD  LOS - 4   Chief Complaint  Patient presents with  . Abdominal Pain        Subjective:   [facility] today has, No headache, No chest pain, No new weakness tingling or numbness, No Cough - SOB.  Improved but still having epigastric abdominal pain  Assessment & Plan     1. Epigastric abdominal pain due to pancreatic mass suspicious for malignancy with elevated CA 19.9 -  history of chronic alcoholic pancreatitis. Continue pain control, bowel rest, IV fluids, GI following likely endoscopic ultrasound with biopsy today or tomorrow . Lipase is stable. Continue clear liquids. Have adjusted his pain regimen with good effect he feels much better.    2. Essential hypertension. As needed IV hydralazine.    3. History of alcohol abuse. Quit one month ago. Place on folic acid and thiamine. Counseled to continue abstaining.    4. Low potassium. Replace and monitor.     Code Status: full  Family Communication: Girlfriend bedside  Disposition Plan: home   Procedures CT abd -Pelvis   Consults  GI - Outlaw   Medications  Scheduled Meds: . folic acid  1 mg Intravenous Daily  . heparin  5,000 Units Subcutaneous 3 times per day  . morphine  60 mg Oral Q12H  . pantoprazole  40 mg Oral Daily  . thiamine IV  100 mg Intravenous Daily   Continuous Infusions: . 0.9 % NaCl with KCl 40 mEq / L     PRN Meds:.ALPRAZolam, alum & mag hydroxide-simeth, feeding supplement (RESOURCE BREEZE), hydrALAZINE, HYDROcodone-acetaminophen, HYDROmorphone (DILAUDID) injection, ondansetron  (ZOFRAN) IV, senna-docusate  DVT Prophylaxis    Heparin   Lab Results  Component Value Date   PLT 299 08/31/2014    Antibiotics   Anti-infectives   None          Objective:   Filed Vitals:   09/02/14 0649 09/02/14 1333 09/02/14 2003 09/03/14 0548  BP: 145/81 114/91 161/80 148/87  Pulse: 69 92 73 76  Temp: 97.9 F (36.6 C) 97.7 F (36.5 C) 98.6 F (37 C) 98.6 F (37 C)  TempSrc: Oral Oral Oral Oral  Resp: 18 18 20 20   Height:      Weight:      SpO2: 98% 96% 99% 95%    Wt Readings from Last 3 Encounters:  08/30/14 83.689 kg (184 lb 8 oz)  08/02/14 87.544 kg (193 lb)  08/02/14 87.544 kg (193 lb)     Intake/Output Summary (Last 24 hours) at 09/03/14 0937 Last data filed at 09/03/14 0600  Gross per 24 hour  Intake   2892 ml  Output   3400 ml  Net   -508 ml     Physical Exam  Awake Alert, Oriented X 3, No new F.N deficits, Normal affect .AT,PERRAL Supple Neck,No JVD, No cervical lymphadenopathy appriciated.  Symmetrical Chest wall movement, Good air movement bilaterally, CTAB RRR,No Gallops,Rubs or new Murmurs, No Parasternal Heave +ve B.Sounds, Abd Soft, minimal epigastirc tenderness, No organomegaly appriciated, No rebound - guarding  or rigidity. No Cyanosis, Clubbing or edema, No new Rash or bruise      Data Review   Micro Results No results found for this or any previous visit (from the past 240 hour(s)).  Radiology Reports Ct Abdomen Pelvis W Contrast  08/30/2014   CLINICAL DATA:  Chronic pancreatitis. Nausea vomiting. Followup exam.  EXAM: CT ABDOMEN AND PELVIS WITH CONTRAST  TECHNIQUE: Multidetector CT imaging of the abdomen and pelvis was performed using the standard protocol following bolus administration of intravenous contrast.  CONTRAST:  140mL OMNIPAQUE IOHEXOL 300 MG/ML  SOLN  COMPARISON:  CT 07/31/2014.  FINDINGS: No focal hepatic abnormality. Spleen normal. Ill-defined complex pancreatic head mass is again noted. The mass measures 4.9  by 3.5 cm. Central lucency is present suggesting necrosis. This finding is highly suspicious with pancreatic cancer. Adjacent nodular PA pancreatic densities consistent peripancreatic lymph nodes are again noted and unchanged. These measure up to approximately 1 cm. 1 cm retroperitoneal lymph nodes are again noted. No associated biliary distention. Gallbladder is nondistended. Dorsal torsion of the fat planes about the mesenteric vasculature noted with possible narrowing of the SMA. Portal vein is patent.  Adrenals are normal. Kidneys are normal. No hydronephrosis or obstructing ureteral stone. The bladder is nondistended. Prostate is slightly enlarged.  No significant inguinal adenopathy. As noted above 1 cm retroperitoneal adenopathy is present along with peripancreatic adenopathy.  Appendix normal. No bowel distention. No free air. No gastric distention. No significant abdominal wall hernia.  Mild basilar atelectasis. Heart size normal. No acute bony abnormality. Diffuse degenerative changes lumbar spine, thickening prominent at L5-S1.  IMPRESSION: 1. 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 Stable adjacent peripancreatic and retroperitoneal lymph nodes. SMA involvement cannot be excluded. No evidence of biliary obstruction. Similar findings noted on prior CT of 07/31/2014. 2. No acute intra-abdominal abnormality identified.   Electronically Signed   By: Marcello Moores  Register   On: 08/30/2014 09:45    Lab Results  Component Value Date   LIPASE 102* 09/02/2014    CBC  Recent Labs Lab 08/30/14 0032 08/30/14 1430 08/31/14 0439  WBC 7.9 9.8 9.0  HGB 15.4 15.9 15.0  HCT 42.3 43.0 41.2  PLT 316 306 299  MCV 87.4 88.1 87.1  MCH 31.8 32.6 31.7  MCHC 36.4* 37.0* 36.4*  RDW 11.3* 11.4* 11.4*  LYMPHSABS 1.5  --   --   MONOABS 0.5  --   --   EOSABS 0.1  --   --   BASOSABS 0.0  --   --     Chemistries   Recent Labs Lab 08/30/14 0032 08/30/14 1430  08/31/14 0439 09/02/14 0940 09/03/14 0820  NA 137  --  140 137 140  K 4.3  --  4.3 3.5* 3.6*  CL 96  --  100 101 100  CO2 25  --  26 24 27   GLUCOSE 123*  --  93 104* 88  BUN 21  --  11 16 9   CREATININE 0.85 0.72 0.79 0.82 0.79  CALCIUM 9.6  --  9.3 8.8 9.1  AST 20  --   --  19 21  ALT 28  --   --  20 24  ALKPHOS 51  --   --  45 48  BILITOT 0.7  --   --  0.8 0.7   ------------------------------------------------------------------------------------------------------------------ estimated creatinine clearance is 100.6 ml/min (by C-G formula based on Cr of 0.79). ------------------------------------------------------------------------------------------------------------------ No results found for this basename: HGBA1C,  in the last 72 hours ------------------------------------------------------------------------------------------------------------------ No results found for this basename: CHOL, HDL, LDLCALC, TRIG, CHOLHDL, LDLDIRECT,  in the last 72 hours ------------------------------------------------------------------------------------------------------------------ No results found for this basename: TSH, T4TOTAL, FREET3, T3FREE, THYROIDAB,  in the last 72 hours ------------------------------------------------------------------------------------------------------------------ No results found for this basename: VITAMINB12, FOLATE, FERRITIN, TIBC, IRON, RETICCTPCT,  in the last 72 hours  Coagulation profile  Recent Labs Lab 09/01/14 1100  INR 1.14    No results found for this basename: DDIMER,  in the last 72 hours  Cardiac Enzymes No results found for this basename: CK, CKMB, TROPONINI, MYOGLOBIN,  in the last 168 hours ------------------------------------------------------------------------------------------------------------------ No components found with this basename: POCBNP,      Time Spent in minutes   35   Emeline Simpson K M.D on 09/03/2014 at 9:37 AM  Between  7am to 7pm - Pager - [phone]  After 7pm go to [email or web address] - password [email or web address]  And look for the night coverage person covering for me after hours  [facility] Office  [phone]

## 2014-09-04 ENCOUNTER — Encounter (HOSPITAL_COMMUNITY): Payer: Self-pay | Admitting: *Deleted

## 2014-09-04 ENCOUNTER — Encounter (HOSPITAL_COMMUNITY)
Admission: EM | Disposition: A | Discharge status: Home / Self Care | Payer: BC Managed Care – PPO | Source: Home / Self Care | Attending: Internal Medicine

## 2014-09-04 ENCOUNTER — Encounter (HOSPITAL_COMMUNITY): Payer: BC Managed Care – PPO | Admitting: Anesthesiology

## 2014-09-04 ENCOUNTER — Inpatient Hospital Stay (HOSPITAL_COMMUNITY): Payer: BC Managed Care – PPO | Admitting: Anesthesiology

## 2014-09-04 DIAGNOSIS — C259 Malignant neoplasm of pancreas, unspecified: Secondary | ICD-10-CM

## 2014-09-04 HISTORY — PX: EUS: SHX5427

## 2014-09-04 HISTORY — DX: Malignant neoplasm of pancreas, unspecified: C25.9

## 2014-09-04 HISTORY — PX: FINE NEEDLE ASPIRATION: SHX5430

## 2014-09-04 LAB — CBC
HCT: 38.4 % — ABNORMAL LOW (ref 39.0–52.0)
Hemoglobin: 13.8 g/dL (ref 13.0–17.0)
MCH: 32.4 pg (ref 26.0–34.0)
MCHC: 35.9 g/dL (ref 30.0–36.0)
MCV: 90.1 fL (ref 78.0–100.0)
Platelets: 264 10*3/uL (ref 150–400)
RBC: 4.26 MIL/uL (ref 4.22–5.81)
RDW: 11.8 % (ref 11.5–15.5)
WBC: 7 10*3/uL (ref 4.0–10.5)

## 2014-09-04 LAB — COMPREHENSIVE METABOLIC PANEL
ALT: 29 U/L (ref 0–53)
ANION GAP: 15 (ref 5–15)
AST: 24 U/L (ref 0–37)
Albumin: 4.1 g/dL (ref 3.5–5.2)
Alkaline Phosphatase: 50 U/L (ref 39–117)
BILIRUBIN TOTAL: 0.8 mg/dL (ref 0.3–1.2)
BUN: 6 mg/dL (ref 6–23)
CHLORIDE: 102 meq/L (ref 96–112)
CO2: 26 meq/L (ref 19–32)
Calcium: 9.5 mg/dL (ref 8.4–10.5)
Creatinine, Ser: 0.8 mg/dL (ref 0.50–1.35)
GFR calc Af Amer: >90 mL/min (ref >90)
Glucose, Bld: 87 mg/dL (ref 70–99)
Potassium: 4.5 mEq/L (ref 3.7–5.3)
Sodium: 143 mEq/L (ref 137–147)
Total Protein: 7.1 g/dL (ref 6.0–8.3)

## 2014-09-04 LAB — MAGNESIUM: Magnesium: 2.1 mg/dL (ref 1.5–2.5)

## 2014-09-04 LAB — PANC CYST FLD ANLYS-PATHFNDR-TG

## 2014-09-04 SURGERY — ESOPHAGEAL ENDOSCOPIC ULTRASOUND (EUS) RADIAL
Anesthesia: Monitor Anesthesia Care

## 2014-09-04 MED ORDER — FENTANYL CITRATE 0.05 MG/ML IJ SOLN
INTRAMUSCULAR | Status: DC | PRN
  Administered 2014-09-04: 100 ug via INTRAVENOUS

## 2014-09-04 MED ORDER — PROPOFOL 10 MG/ML IV BOLUS
INTRAVENOUS | Status: AC
  Filled 2014-09-04: qty 20

## 2014-09-04 MED ORDER — SODIUM CHLORIDE 0.9 % IJ SOLN
INTRAMUSCULAR | Status: AC
  Filled 2014-09-04: qty 10

## 2014-09-04 MED ORDER — CIPROFLOXACIN IN D5W 400 MG/200ML IV SOLN
400.0000 mg | Freq: Once | INTRAVENOUS | Status: AC
  Administered 2014-09-04: 400 mg via INTRAVENOUS

## 2014-09-04 MED ORDER — PROPOFOL 10 MG/ML IV BOLUS
INTRAVENOUS | Status: AC
  Filled 2014-09-04: qty 40

## 2014-09-04 MED ORDER — CIPROFLOXACIN IN D5W 400 MG/200ML IV SOLN
INTRAVENOUS | Status: AC
  Filled 2014-09-04: qty 200

## 2014-09-04 MED ORDER — PROPOFOL INFUSION 10 MG/ML OPTIME
INTRAVENOUS | Status: DC | PRN
  Administered 2014-09-04: 300 ug/kg/min via INTRAVENOUS

## 2014-09-04 MED ORDER — FENTANYL CITRATE 0.05 MG/ML IJ SOLN
INTRAMUSCULAR | Status: AC
  Filled 2014-09-04: qty 2

## 2014-09-04 MED ORDER — CIPROFLOXACIN HCL 500 MG PO TABS
500.0000 mg | ORAL_TABLET | Freq: Two times a day (BID) | ORAL | Status: DC
  Administered 2014-09-04 – 2014-09-08 (×8): 500 mg via ORAL
  Filled 2014-09-04 (×14): qty 1

## 2014-09-04 MED ORDER — BUPIVACAINE HCL (PF) 0.25 % IJ SOLN
INTRAMUSCULAR | Status: AC
  Filled 2014-09-04: qty 30

## 2014-09-04 MED ORDER — SODIUM CHLORIDE 0.9 % IV SOLN: INTRAVENOUS | Status: DC

## 2014-09-04 MED ORDER — LACTATED RINGERS IV SOLN
INTRAVENOUS | Status: DC | PRN
  Administered 2014-09-04: 12:00:00 via INTRAVENOUS

## 2014-09-04 MED ORDER — PROPOFOL 10 MG/ML IV BOLUS
INTRAVENOUS | Status: DC | PRN
  Administered 2014-09-04 (×5): 20 mg via INTRAVENOUS
  Administered 2014-09-04: 30 mg via INTRAVENOUS

## 2014-09-04 MED ORDER — SODIUM CHLORIDE 0.9 % IV SOLN
INTRAVENOUS | Status: AC
  Administered 2014-09-04 – 2014-09-05 (×2): via INTRAVENOUS

## 2014-09-04 MED ORDER — PROMETHAZINE HCL 25 MG/ML IJ SOLN: 6.2500 mg | INTRAMUSCULAR | Status: DC | PRN

## 2014-09-04 MED ORDER — TRIAMCINOLONE ACETONIDE 40 MG/ML IJ SUSP
INTRAMUSCULAR | Status: AC
  Filled 2014-09-04: qty 2

## 2014-09-04 MED ORDER — GLYCOPYRROLATE 0.2 MG/ML IJ SOLN
INTRAMUSCULAR | Status: DC | PRN
  Administered 2014-09-04: 0.1 mg via INTRAVENOUS

## 2014-09-04 NOTE — Transfer of Care (Signed)
Immediate Anesthesia Transfer of Care Note  Patient: Richard Cantu  Procedure(s) Performed: Procedure(s): ESOPHAGEAL ENDOSCOPIC ULTRASOUND (EUS) RADIAL (N/A) FINE NEEDLE ASPIRATION (FNA) RADIAL (N/A)  Patient Location: endo recovery  Anesthesia Type:MAC  Level of Consciousness: Patient easily awoken, sedated, comfortable, cooperative, following commands, responds to stimulation.   Airway & Oxygen Therapy: Patient spontaneously breathing, ventilating well, oxygen via simple oxygen mask.  Post-op Assessment: Report given to PACU RN, vital signs reviewed and stable, moving all extremities.   Post vital signs: Reviewed and stable.  Complications: No apparent anesthesia complications

## 2014-09-04 NOTE — Op Note (Signed)
[place], [address]   ENDOSCOPIC ULTRASOUND PROCEDURE REPORT  PATIENT: Richard Cantu, Richard Cantu  MR#: [id number] BIRTHDATE: 02-Apr-1956  GENDER: male ENDOSCOPIST: Arta Silence, MD REFERRED BY:  [facility] PROCEDURE DATE:  09/04/2014 PROCEDURE:   Upper EUS w/FNA ASA CLASS:      Class II INDICATIONS:   1.  pancreatic lesion, abdominal pain. MEDICATIONS: Monitored anesthesia care, ciprofloxacin 400 mg IV  DESCRIPTION OF PROCEDURE:   After the risks benefits and alternatives of the procedure were  explained, informed consent was obtained. The patient was then placed in the left, lateral, decubitus postion and IV sedation was administered. Throughout the procedure, the patients blood pressure, pulse and oxygen saturations were monitored continuously.  Under direct visualization, the     endoscope was introduced through the mouth and advanced to the second portion of the duodenum .  Water was used as necessary to provide an acoustic interface.  Upon completion of the imaging, water was removed and the patient was sent to the recovery room in satisfactory condition.   FINDINGS:      Cystic lesion again seen at interface of head and uncinate pancreas.  There was internal debris.  Measured about 3 x 4 cm in size, irregularly shaped.  I did not detect an appreciably large enough segment of solid tissue to merit aspiration.  There were a couple round 1cm diameter hypoechoic well-defined peripancreatic lymph nodes, one of which was FNA biopsied with 25g cytology needle; preliminary cytology shows scant lymph cells but no obvious malignancy.  Cystic region was aspirated first with 25 g needle with some turbid brown debris suctioned out, which was sent for cell block analysis.  A second pass was made with a 19g needle; return became bloody, and cyst aspiration was terminated; all residual cyst fluid was sent for (1) culture, (2) CEA and  amylase, and (3) cytology.  IMPRESSION:     As above.  Differential considerations include necrotic tumor versus pseudocyst/pancreatitis-related fluid collection.  RECOMMENDATIONS:     1.  Watch for potential complications of procedure. 2.  Await final cytology and culture and fluid study results. 3.  Cipro 500 mg po bid x 5 days post cyst aspiration. 4.  Gradually advance diet. 5.  Titrate analgesics. 6.  If no answers with our current studies, patient might need surgical consultation for consideration of exploratory laparoscopy versus repeat imaging in 6-8 weeks. 7.  [facility] will follow.   _______________________________ Lorrin MaisArta Silence, MD 09/04/2014 1:36 PM   CC:

## 2014-09-04 NOTE — Progress Notes (Signed)
Patient transferred to [facility] via [facility] for procedure. Maintained on IV fluids. Girlfriend present with patient.

## 2014-09-04 NOTE — Interval H&P Note (Signed)
History and Physical Interval Note:  09/04/2014 12:15 PM  [employer]  has presented today for surgery, with the diagnosis of pancreatic lesion  The various methods of treatment have been discussed with the patient and family. After consideration of risks, benefits and other options for treatment, the patient has consented to  Procedure(s): ESOPHAGEAL ENDOSCOPIC ULTRASOUND (EUS) RADIAL (N/A) FINE NEEDLE ASPIRATION (FNA) RADIAL (N/A) as a surgical intervention .  The patient's history has been reviewed, patient examined, no change in status, stable for surgery.  I have reviewed the patient's chart and labs.  Questions were answered to the patient's satisfaction.     Eirik Schueler M  Assessment:  1.  Pancreatic lesion.  Necrotic mass versus pseudocyst versus other. 2.  Abdominal pain.  Plan:  1.  Endoscopic ultrasound with possible fine needle aspiration, possible cyst aspiration, possible celiac plexus block. 2.  Risks (bleeding, infection, bowel perforation that could require surgery, sedation-related changes in cardiopulmonary systems), benefits (identification and possible treatment of source of symptoms, exclusion of certain causes of symptoms), and alternatives (watchful waiting, radiographic imaging studies, empiric medical treatment) of upper endoscopy with ultrasound and possible celiac block, possible biopsies (EUS +/- FNA +/- celiac plexus block) were explained to patient/family in detail and patient wishes to proceed.

## 2014-09-04 NOTE — Anesthesia Postprocedure Evaluation (Signed)
  Anesthesia Post-op Note  Patient: Richard Cantu  Procedure(s) Performed: Procedure(s) (LRB): ESOPHAGEAL ENDOSCOPIC ULTRASOUND (EUS) RADIAL (N/A) FINE NEEDLE ASPIRATION (FNA) RADIAL (N/A)  Patient Location: PACU  Anesthesia Type: MAC  Level of Consciousness: awake and alert   Airway and Oxygen Therapy: Patient Spontanous Breathing  Post-op Pain: mild  Post-op Assessment: Post-op Vital signs reviewed, Patient's Cardiovascular Status Stable, Respiratory Function Stable, Patent Airway and No signs of Nausea or vomiting  Last Vitals:  Filed Vitals:   09/04/14 1405  BP:   Pulse: 58  Temp:   Resp: 13    Post-op Vital Signs: stable   Complications: No apparent anesthesia complications

## 2014-09-04 NOTE — H&P (View-Only) (Signed)
Patient Demographics  Richard Cantu, is a 58 y.o. male, DOB - 1956/03/21, LNZ:[id number]  Admit date - 08/30/2014   Admitting Physician Ripudeep Krystal Eaton, MD  Outpatient Primary MD for the patient is Donnie Coffin, MD  LOS - 5   Chief Complaint  Patient presents with  . Abdominal Pain        Subjective:   [facility] today has, No headache, No chest pain, No new weakness tingling or numbness, No Cough - SOB.  Improved but still having epigastric abdominal pain  Assessment & Plan     1. Epigastric abdominal pain due to pancreatic mass suspicious for malignancy with elevated CA 19.9 -  history of chronic alcoholic pancreatitis. Continue pain control, bowel rest, IV fluids, GI following likely endoscopic ultrasound with biopsy today . Lipase is stable. Continue clear liquids. Have adjusted his pain regimen with good effect he feels much better.    2. Essential hypertension. As needed IV hydralazine.    3. History of alcohol abuse. Quit one month ago. Place on folic acid and thiamine. Counseled to continue abstaining.    4. Low potassium. Replaced and monitor.     Code Status: full  Family Communication: Girlfriend bedside  Disposition Plan: home   Procedures CT abd -Pelvis   Consults  GI - Outlaw   Medications  Scheduled Meds: . folic acid  1 mg Intravenous Daily  . heparin  5,000 Units Subcutaneous 3 times per day  . morphine  60 mg Oral Q12H  . pantoprazole  40 mg Oral Daily  . thiamine IV  100 mg Intravenous Daily   Continuous Infusions:   PRN Meds:.ALPRAZolam, alum & mag hydroxide-simeth, feeding supplement (RESOURCE BREEZE), hydrALAZINE, HYDROcodone-acetaminophen, HYDROmorphone (DILAUDID) injection, ondansetron (ZOFRAN) IV, senna-docusate  DVT Prophylaxis     Heparin   Lab Results  Component Value Date   PLT 264 09/04/2014    Antibiotics   Anti-infectives   None          Objective:   Filed Vitals:   09/03/14 0548 09/03/14 1300 09/03/14 2138 09/04/14 0611  BP: 148/87 128/82 156/86 129/82  Pulse: 76 79 67 86  Temp: 98.6 F (37 C) 98.2 F (36.8 C) 98.3 F (36.8 C) 98 F (36.7 C)  TempSrc: Oral Oral Oral Oral  Resp: 20 18 17 18   Height:      Weight:      SpO2: 95% 97% 98% 94%    Wt Readings from Last 3 Encounters:  08/30/14 83.689 kg (184 lb 8 oz)  08/30/14 83.689 kg (184 lb 8 oz)  08/02/14 87.544 kg (193 lb)     Intake/Output Summary (Last 24 hours) at 09/04/14 1017 Last data filed at 09/04/14 0800  Gross per 24 hour  Intake   2735 ml  Output   4000 ml  Net  -1265 ml     Physical Exam  Awake Alert, Oriented X 3, No new F.N deficits, Normal affect [place].AT,PERRAL Supple Neck,No JVD, No cervical lymphadenopathy appriciated.  Symmetrical Chest wall movement, Good air movement bilaterally, CTAB RRR,No Gallops,Rubs or new Murmurs, No Parasternal Heave +ve B.Sounds, Abd Soft, minimal epigastirc tenderness, No organomegaly appriciated, No rebound - guarding or rigidity. No Cyanosis, Clubbing or edema, No new Rash or bruise  Data Review   Micro Results No results found for this or any previous visit (from the past 240 hour(s)).  Radiology Reports Ct Abdomen Pelvis W Contrast  08/30/2014   CLINICAL DATA:  Chronic pancreatitis. Nausea vomiting. Followup exam.  EXAM: CT ABDOMEN AND PELVIS WITH CONTRAST  TECHNIQUE: Multidetector CT imaging of the abdomen and pelvis was performed using the standard protocol following bolus administration of intravenous contrast.  CONTRAST:  132mL OMNIPAQUE IOHEXOL 300 MG/ML  SOLN  COMPARISON:  CT 07/31/2014.  FINDINGS: No focal hepatic abnormality. Spleen normal. Ill-defined complex pancreatic head mass is again noted. The mass measures 4.9 by 3.5 cm. Central lucency is present  suggesting necrosis. This finding is highly suspicious with pancreatic cancer. Adjacent nodular PA pancreatic densities consistent peripancreatic lymph nodes are again noted and unchanged. These measure up to approximately 1 cm. 1 cm retroperitoneal lymph nodes are again noted. No associated biliary distention. Gallbladder is nondistended. Dorsal torsion of the fat planes about the mesenteric vasculature noted with possible narrowing of the SMA. Portal vein is patent.  Adrenals are normal. Kidneys are normal. No hydronephrosis or obstructing ureteral stone. The bladder is nondistended. Prostate is slightly enlarged.  No significant inguinal adenopathy. As noted above 1 cm retroperitoneal adenopathy is present along with peripancreatic adenopathy.  Appendix normal. No bowel distention. No free air. No gastric distention. No significant abdominal wall hernia.  Mild basilar atelectasis. Heart size normal. No acute bony abnormality. Diffuse degenerative changes lumbar spine, thickening prominent at L5-S1.  IMPRESSION: 1. 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 Stable adjacent peripancreatic and retroperitoneal lymph nodes. SMA involvement cannot be excluded. No evidence of biliary obstruction. Similar findings noted on prior CT of 07/31/2014. 2. No acute intra-abdominal abnormality identified.   Electronically Signed   By: Marcello Moores  Register   On: 08/30/2014 09:45    Lab Results  Component Value Date   LIPASE 102* 09/02/2014    CBC  Recent Labs Lab 08/30/14 0032 08/30/14 1430 08/31/14 0439 09/04/14 0507  WBC 7.9 9.8 9.0 7.0  HGB 15.4 15.9 15.0 13.8  HCT 42.3 43.0 41.2 38.4*  PLT 316 306 299 264  MCV 87.4 88.1 87.1 90.1  MCH 31.8 32.6 31.7 32.4  MCHC 36.4* 37.0* 36.4* 35.9  RDW 11.3* 11.4* 11.4* 11.8  LYMPHSABS 1.5  --   --   --   MONOABS 0.5  --   --   --   EOSABS 0.1  --   --   --   BASOSABS 0.0  --   --   --     Chemistries   Recent  Labs Lab 08/30/14 0032 08/30/14 1430 08/31/14 0439 09/02/14 0940 09/03/14 0820 09/04/14 0507  NA 137  --  140 137 140 143  K 4.3  --  4.3 3.5* 3.6* 4.5  CL 96  --  100 101 100 102  CO2 25  --  26 24 27 26   GLUCOSE 123*  --  93 104* 88 87  BUN 21  --  11 16 9 6   CREATININE 0.85 0.72 0.79 0.82 0.79 0.80  CALCIUM 9.6  --  9.3 8.8 9.1 9.5  MG  --   --   --   --   --  2.1  AST 20  --   --  19 21 24   ALT 28  --   --  20 24 29   ALKPHOS 51  --   --  45 48 50  BILITOT 0.7  --   --  0.8 0.7 0.8   ------------------------------------------------------------------------------------------------------------------ estimated creatinine clearance is 100.6 ml/min (by C-G formula based on Cr of 0.8). ------------------------------------------------------------------------------------------------------------------ No results found for this basename: HGBA1C,  in the last 72 hours ------------------------------------------------------------------------------------------------------------------ No results found for this basename: CHOL, HDL, LDLCALC, TRIG, CHOLHDL, LDLDIRECT,  in the last 72 hours ------------------------------------------------------------------------------------------------------------------ No results found for this basename: TSH, T4TOTAL, FREET3, T3FREE, THYROIDAB,  in the last 72 hours ------------------------------------------------------------------------------------------------------------------ No results found for this basename: VITAMINB12, FOLATE, FERRITIN, TIBC, IRON, RETICCTPCT,  in the last 72 hours  Coagulation profile  Recent Labs Lab 09/01/14 1100  INR 1.14    No results found for this basename: DDIMER,  in the last 72 hours  Cardiac Enzymes No results found for this basename: CK, CKMB, TROPONINI, MYOGLOBIN,  in the last 168 hours ------------------------------------------------------------------------------------------------------------------ No components  found with this basename: POCBNP,      Time Spent in minutes   35   Leyan Branden K M.D on 09/04/2014 at 10:17 AM  Between 7am to 7pm - Pager - [phone]  After 7pm go to [email or web address] - password [email or web address]  And look for the night coverage person covering for me after hours  [facility] Office  [phone]

## 2014-09-04 NOTE — Progress Notes (Signed)
Patient Demographics  Richard Cantu, is a 58 y.o. male, DOB - 1956/07/11, [id number]  Admit date - 08/30/2014   Admitting Physician Ripudeep Krystal Eaton, MD  Outpatient Primary MD for the patient is Donnie Coffin, MD  LOS - 5   Chief Complaint  Patient presents with  . Abdominal Pain        Subjective:   [name] today has, No headache, No chest pain, No new weakness tingling or numbness, No Cough - SOB.  Improved but still having epigastric abdominal pain  Assessment & Plan     1. Epigastric abdominal pain due to pancreatic mass suspicious for malignancy with elevated CA 19.9 -  history of chronic alcoholic pancreatitis. Continue pain control, bowel rest, IV fluids, GI following likely endoscopic ultrasound with biopsy today . Lipase is stable. Continue clear liquids. Have adjusted his pain regimen with good effect he feels much better.    2. Essential hypertension. As needed IV hydralazine.    3. History of alcohol abuse. Quit one month ago. Place on folic acid and thiamine. Counseled to continue abstaining.    4. Low potassium. Replaced and monitor.     Code Status: full  Family Communication: Girlfriend bedside  Disposition Plan: home   Procedures CT abd -Pelvis   Consults  GI - Outlaw   Medications  Scheduled Meds: . folic acid  1 mg Intravenous Daily  . heparin  5,000 Units Subcutaneous 3 times per day  . morphine  60 mg Oral Q12H  . pantoprazole  40 mg Oral Daily  . thiamine IV  100 mg Intravenous Daily   Continuous Infusions:   PRN Meds:.ALPRAZolam, alum & mag hydroxide-simeth, feeding supplement (RESOURCE BREEZE), hydrALAZINE, HYDROcodone-acetaminophen, HYDROmorphone (DILAUDID) injection, ondansetron (ZOFRAN) IV, senna-docusate  DVT Prophylaxis     Heparin   Lab Results  Component Value Date   PLT 264 09/04/2014    Antibiotics   Anti-infectives   None          Objective:   Filed Vitals:   09/03/14 0548 09/03/14 1300 09/03/14 2138 09/04/14 0611  BP: 148/87 128/82 156/86 129/82  Pulse: 76 79 67 86  Temp: 98.6 F (37 C) 98.2 F (36.8 C) 98.3 F (36.8 C) 98 F (36.7 C)  TempSrc: Oral Oral Oral Oral  Resp: 20 18 17 18   Height:      Weight:      SpO2: 95% 97% 98% 94%    Wt Readings from Last 3 Encounters:  08/30/14 83.689 kg (184 lb 8 oz)  08/30/14 83.689 kg (184 lb 8 oz)  08/02/14 87.544 kg (193 lb)     Intake/Output Summary (Last 24 hours) at 09/04/14 1017 Last data filed at 09/04/14 0800  Gross per 24 hour  Intake   2735 ml  Output   4000 ml  Net  -1265 ml     Physical Exam  Awake Alert, Oriented X 3, No new F.N deficits, Normal affect [place].AT,PERRAL Supple Neck,No JVD, No cervical lymphadenopathy appriciated.  Symmetrical Chest wall movement, Good air movement bilaterally, CTAB RRR,No Gallops,Rubs or new Murmurs, No Parasternal Heave +ve B.Sounds, Abd Soft, minimal epigastirc tenderness, No organomegaly appriciated, No rebound - guarding or rigidity. No Cyanosis, Clubbing or edema, No new Rash or bruise  Data Review   Micro Results No results found for this or any previous visit (from the past 240 hour(s)).  Radiology Reports Ct Abdomen Pelvis W Contrast  08/30/2014   CLINICAL DATA:  Chronic pancreatitis. Nausea vomiting. Followup exam.  EXAM: CT ABDOMEN AND PELVIS WITH CONTRAST  TECHNIQUE: Multidetector CT imaging of the abdomen and pelvis was performed using the standard protocol following bolus administration of intravenous contrast.  CONTRAST:  149mL OMNIPAQUE IOHEXOL 300 MG/ML  SOLN  COMPARISON:  CT 07/31/2014.  FINDINGS: No focal hepatic abnormality. Spleen normal. Ill-defined complex pancreatic head mass is again noted. The mass measures 4.9 by 3.5 cm. Central lucency is present  suggesting necrosis. This finding is highly suspicious with pancreatic cancer. Adjacent nodular PA pancreatic densities consistent peripancreatic lymph nodes are again noted and unchanged. These measure up to approximately 1 cm. 1 cm retroperitoneal lymph nodes are again noted. No associated biliary distention. Gallbladder is nondistended. Dorsal torsion of the fat planes about the mesenteric vasculature noted with possible narrowing of the SMA. Portal vein is patent.  Adrenals are normal. Kidneys are normal. No hydronephrosis or obstructing ureteral stone. The bladder is nondistended. Prostate is slightly enlarged.  No significant inguinal adenopathy. As noted above 1 cm retroperitoneal adenopathy is present along with peripancreatic adenopathy.  Appendix normal. No bowel distention. No free air. No gastric distention. No significant abdominal wall hernia.  Mild basilar atelectasis. Heart size normal. No acute bony abnormality. Diffuse degenerative changes lumbar spine, thickening prominent at L5-S1.  IMPRESSION: 1. 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 Stable adjacent peripancreatic and retroperitoneal lymph nodes. SMA involvement cannot be excluded. No evidence of biliary obstruction. Similar findings noted on prior CT of 07/31/2014. 2. No acute intra-abdominal abnormality identified.   Electronically Signed   By: Marcello Moores  Register   On: 08/30/2014 09:45    Lab Results  Component Value Date   LIPASE 102* 09/02/2014    CBC  Recent Labs Lab 08/30/14 0032 08/30/14 1430 08/31/14 0439 09/04/14 0507  WBC 7.9 9.8 9.0 7.0  HGB 15.4 15.9 15.0 13.8  HCT 42.3 43.0 41.2 38.4*  PLT 316 306 299 264  MCV 87.4 88.1 87.1 90.1  MCH 31.8 32.6 31.7 32.4  MCHC 36.4* 37.0* 36.4* 35.9  RDW 11.3* 11.4* 11.4* 11.8  LYMPHSABS 1.5  --   --   --   MONOABS 0.5  --   --   --   EOSABS 0.1  --   --   --   BASOSABS 0.0  --   --   --     Chemistries   Recent  Labs Lab 08/30/14 0032 08/30/14 1430 08/31/14 0439 09/02/14 0940 09/03/14 0820 09/04/14 0507  NA 137  --  140 137 140 143  K 4.3  --  4.3 3.5* 3.6* 4.5  CL 96  --  100 101 100 102  CO2 25  --  26 24 27 26   GLUCOSE 123*  --  93 104* 88 87  BUN 21  --  11 16 9 6   CREATININE 0.85 0.72 0.79 0.82 0.79 0.80  CALCIUM 9.6  --  9.3 8.8 9.1 9.5  MG  --   --   --   --   --  2.1  AST 20  --   --  19 21 24   ALT 28  --   --  20 24 29   ALKPHOS 51  --   --  45 48 50  BILITOT 0.7  --   --  0.8 0.7 0.8   ------------------------------------------------------------------------------------------------------------------ estimated creatinine clearance is 100.6 ml/min (by C-G formula based on Cr of 0.8). ------------------------------------------------------------------------------------------------------------------ No results found for this basename: HGBA1C,  in the last 72 hours ------------------------------------------------------------------------------------------------------------------ No results found for this basename: CHOL, HDL, LDLCALC, TRIG, CHOLHDL, LDLDIRECT,  in the last 72 hours ------------------------------------------------------------------------------------------------------------------ No results found for this basename: TSH, T4TOTAL, FREET3, T3FREE, THYROIDAB,  in the last 72 hours ------------------------------------------------------------------------------------------------------------------ No results found for this basename: VITAMINB12, FOLATE, FERRITIN, TIBC, IRON, RETICCTPCT,  in the last 72 hours  Coagulation profile  Recent Labs Lab 09/01/14 1100  INR 1.14    No results found for this basename: DDIMER,  in the last 72 hours  Cardiac Enzymes No results found for this basename: CK, CKMB, TROPONINI, MYOGLOBIN,  in the last 168 hours ------------------------------------------------------------------------------------------------------------------ No components  found with this basename: POCBNP,      Time Spent in minutes   35   Jeaninne Lodico K M.D on 09/04/2014 at 10:17 AM  Between 7am to 7pm - Pager - [phone]  After 7pm go to [email or web address] - password [email or web address]  And look for the night coverage person covering for me after hours  [facility] Office  [phone]

## 2014-09-05 ENCOUNTER — Encounter (HOSPITAL_COMMUNITY): Payer: Self-pay | Admitting: Gastroenterology

## 2014-09-05 DIAGNOSIS — R1010 Upper abdominal pain, unspecified: Secondary | ICD-10-CM

## 2014-09-05 LAB — CBC
HCT: 38.5 % — ABNORMAL LOW (ref 39.0–52.0)
Hemoglobin: 14.1 g/dL (ref 13.0–17.0)
MCH: 32.1 pg (ref 26.0–34.0)
MCHC: 36.6 g/dL — ABNORMAL HIGH (ref 30.0–36.0)
MCV: 87.7 fL (ref 78.0–100.0)
PLATELETS: 265 10*3/uL (ref 150–400)
RBC: 4.39 MIL/uL (ref 4.22–5.81)
RDW: 11.7 % (ref 11.5–15.5)
WBC: 8.4 10*3/uL (ref 4.0–10.5)

## 2014-09-05 LAB — COMPREHENSIVE METABOLIC PANEL
ALBUMIN: 4.2 g/dL (ref 3.5–5.2)
ALT: 38 U/L (ref 0–53)
AST: 27 U/L (ref 0–37)
Alkaline Phosphatase: 54 U/L (ref 39–117)
Anion gap: 13 (ref 5–15)
BUN: 7 mg/dL (ref 6–23)
CO2: 28 mEq/L (ref 19–32)
Calcium: 9.7 mg/dL (ref 8.4–10.5)
Chloride: 100 mEq/L (ref 96–112)
Creatinine, Ser: 0.86 mg/dL (ref 0.50–1.35)
GFR calc Af Amer: >90 mL/min (ref >90)
GFR calc non Af Amer: >90 mL/min (ref >90)
Glucose, Bld: 98 mg/dL (ref 70–99)
Potassium: 4.2 mEq/L (ref 3.7–5.3)
Sodium: 141 mEq/L (ref 137–147)
TOTAL PROTEIN: 7.2 g/dL (ref 6.0–8.3)
Total Bilirubin: 0.9 mg/dL (ref 0.3–1.2)

## 2014-09-05 MED ORDER — VITAMIN B-1 100 MG PO TABS
100.0000 mg | ORAL_TABLET | Freq: Every day | ORAL | Status: DC
  Administered 2014-09-05 – 2014-09-08 (×4): 100 mg via ORAL
  Filled 2014-09-05 (×5): qty 1

## 2014-09-05 MED ORDER — FOLIC ACID 1 MG PO TABS
1.0000 mg | ORAL_TABLET | Freq: Every day | ORAL | Status: DC
  Administered 2014-09-05 – 2014-09-08 (×4): 1 mg via ORAL
  Filled 2014-09-05 (×5): qty 1

## 2014-09-05 MED ORDER — POLYETHYLENE GLYCOL 3350 17 G PO PACK: 17.0000 g | PACK | Freq: Every day | ORAL | Status: DC | PRN

## 2014-09-05 MED ORDER — BOOST / RESOURCE BREEZE PO LIQD
1.0000 | Freq: Three times a day (TID) | ORAL | Status: DC
  Administered 2014-09-05 (×2): via ORAL
  Administered 2014-09-06: 1 via ORAL
  Administered 2014-09-06: 10:00:00 via ORAL
  Administered 2014-09-07 (×2): 1 via ORAL

## 2014-09-05 MED ORDER — SENNOSIDES-DOCUSATE SODIUM 8.6-50 MG PO TABS
1.0000 | ORAL_TABLET | Freq: Two times a day (BID) | ORAL | Status: DC
  Administered 2014-09-05 – 2014-09-08 (×7): 1 via ORAL
  Filled 2014-09-05 (×7): qty 1

## 2014-09-05 NOTE — Progress Notes (Signed)
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  I informed the patient of this information and discussed for about 15 minutes where we go from here.    It is not clear to me, or to Dr. Paulita Fujita per telephone discussion, whether this lesion represents a necrotic primary pancreatic tumor, or a cystic neoplasm with malignancy.  PLAN:   Surgery and oncologic consultation (I will call appropriate consults in a.m.)  Cleotis Nipper, M.D. [phone]

## 2014-09-05 NOTE — Progress Notes (Signed)
NUTRITION FOLLOW UP  Intervention:   Resource Breeze po TID, each supplement provides 250 kcal and 9 grams of protein.   Nutrition Dx:   Increased nutrient needs related to recurrent pancreatitis as evidenced by estimated needs.   Goal:   Pt to meet >/= 90% of their estimated nutrition needs   Monitor:   Diet advancement, PO intake, supplement acceptance   Assessment:   Pt with hx of alcohol abuse who quit in 9/15. Pt with chronic pancreatitis with pseudocyst who was admitted with worsening upper abd pain. Pt with N/V PTA.  Per GI pt with pancreatic head cyst vs pseudocyst vs necrotic mass. Plan for EUS with guided biopsy aspiration next week.  Pt s/p EUS with aspiration and sampling of adjacent lymph nodes to determine pancreatic cysts vs cystic neoplasm- awaiting final pathology.  Resource Breeze was ordered PRN, but per [facility], pt has not been requesting. Pt reports that he is willing to try on a regular schedule. Discussed available options, however, pt requested to continue with Resource Breeze to ensure good tolerance of supplement. Pt has been advanced to a full liquid diet. PO's improving 25-100%. He reports improved appetite, however, continues to have some abdominal pain and bloating after eating, although this has improved. Discussed principles and rationale for full liquid diet and encouraged continued good PO intake to support healing process. Pt very grateful for RD visit.  Labs reviewed. WDL  Height: Ht Readings from Last 1 Encounters:  08/30/14 5\' 9"  (1.753 m)    Weight Status:   Wt Readings from Last 1 Encounters:  08/30/14 184 lb 8 oz (83.689 kg)    Re-estimated needs:  Kcal: 2200-2400  Protein: 125-135 grams  Fluid: > 2.2 L/day  Skin: WDL  Diet Order: Full Liquid   Intake/Output Summary (Last 24 hours) at 09/05/14 1050 Last data filed at 09/05/14 0902  Gross per 24 hour  Intake   2767 ml  Output   3425 ml  Net   -658 ml    Last BM:  08/30/14   Labs:   Recent Labs Lab 09/03/14 0820 09/04/14 0507 09/05/14 0500  NA 140 143 141  K 3.6* 4.5 4.2  CL 100 102 100  CO2 27 26 28   BUN 9 6 7   CREATININE 0.79 0.80 0.86  CALCIUM 9.1 9.5 9.7  MG  --  2.1  --   GLUCOSE 88 87 98    CBG (last 3)  No results found for this basename: GLUCAP,  in the last 72 hours  Scheduled Meds: . ciprofloxacin  500 mg Oral BID  . folic acid  1 mg Oral Daily  . heparin  5,000 Units Subcutaneous 3 times per day  . morphine  60 mg Oral Q12H  . pantoprazole  40 mg Oral Daily  . senna-docusate  1 tablet Oral BID  . thiamine  100 mg Oral Daily    Continuous Infusions:   Richard Cantu A. Jimmye Norman, RD, LDN Pager: [phone] After hours Pager: [phone]

## 2014-09-05 NOTE — Progress Notes (Signed)
[facility] PROGRESS NOTE  Nickey Kloepfer Limbert OFB:[id number] DOB: [birth year] DOA: 08/30/2014 PCP: Donnie Coffin, MD  Assessment/Plan: 58 year old male with history of alcohol abuse, quit drinking in September 2015, chronic pancreatitis with pseudocyst, presented to the ER with worsening upper abdominal pain. Per patient, he has been having significant pain over the last 1 month, has been using Percocet every 6 hours but worsened in the last 2 days. Patient reports that he has been unable to eat for the last 1 week and has been trying to eat pure diet. He has been having nausea, belching and had 1 episode of vomiting today and has been having significant nausea in the last week. Patient has been followed by Dr. Paulita Fujita with gastroenterology.  He recently had endoscopy ultrasound done on 9/11 which showed chronic pancreatitis, 3x3 cm pancreatic pseudocyst. CT abdomen showed 4.9 x 3.5 cm pancreatic head mass, likely pancreatic pseudocyst. GI has been consulted by EDP. Patient will be admitted for acute on chronic pancreatitis.  1. pancreatic mass suspicious for malignancy with elevated CA 19.9 vs cyst  -CT: Stable ill-defined 4.9 x 3.5 cm mass pancreatic head most consistent pancreatic carcinoma. - history of chronic alcoholic pancreatitis. Continue pain control, IV fluids, Lipase is stable.  -10/13: s/p EUS/biopsy pend path;  -on atx per GI for probable infection  2. Essential hypertension. Not on home meds; stable;  3. History of alcohol abuse. Quit one month ago. Place on folic acid and thiamine. Counseled to continue abstaining.  4. Low potassium. Replaced 5. abd pain with pancreatic mass lesionl; started on scheduled opioids; cont to titrate as needed; monitor for overdose;  -added bowel regimen; outpatient follow up with pain clinic   D/c plans per GI  Code Status: full Family Communication:  D/w patient (indicate person spoken with, relationship, and if by phone, the  number) Disposition Plan: home 24-48 hrs    Consultants:  GI  Procedures:  EUS  Antibiotics:  none (indicate start date, and stop date if known)  HPI/Subjective: alert  Objective: Filed Vitals:   09/05/14 0640  BP: 109/78  Pulse: 92  Temp: 98.1 F (36.7 C)  Resp: 12    Intake/Output Summary (Last 24 hours) at 09/05/14 0956 Last data filed at 09/05/14 0902  Gross per 24 hour  Intake   2767 ml  Output   3425 ml  Net   -658 ml   Filed Weights   08/30/14 0029  Weight: 83.689 kg (184 lb 8 oz)    Exam:   General:  alert  Cardiovascular: s1,s2 rrr  Respiratory: CTA BL  Abdomen: soft, nt,nd   Musculoskeletal: no LE edema    Data Reviewed: Basic Metabolic Panel:  Recent Labs Lab 08/31/14 0439 09/02/14 0940 09/03/14 0820 09/04/14 0507 09/05/14 0500  NA 140 137 140 143 141  K 4.3 3.5* 3.6* 4.5 4.2  CL 100 101 100 102 100  CO2 26 24 27 26 28   GLUCOSE 93 104* 88 87 98  BUN 11 16 9 6 7   CREATININE 0.79 0.82 0.79 0.80 0.86  CALCIUM 9.3 8.8 9.1 9.5 9.7  MG  --   --   --  2.1  --    Liver Function Tests:  Recent Labs Lab 08/30/14 0032 09/02/14 0940 09/03/14 0820 09/04/14 0507 09/05/14 0500  AST 20 19 21 24 27   ALT 28 20 24 29  38  ALKPHOS 51 45 48 50 54  BILITOT 0.7 0.8 0.7 0.8 0.9  PROT 8.1 6.7 6.9 7.1 7.2  ALBUMIN  4.7 4.0 4.0 4.1 4.2    Recent Labs Lab 08/30/14 0032 09/02/14 0940  LIPASE 50 102*   No results found for this basename: AMMONIA,  in the last 168 hours CBC:  Recent Labs Lab 08/30/14 0032 08/30/14 1430 08/31/14 0439 09/04/14 0507 09/05/14 0500  WBC 7.9 9.8 9.0 7.0 8.4  NEUTROABS 5.8  --   --   --   --   HGB 15.4 15.9 15.0 13.8 14.1  HCT 42.3 43.0 41.2 38.4* 38.5*  MCV 87.4 88.1 87.1 90.1 87.7  PLT 316 306 299 264 265   Cardiac Enzymes: No results found for this basename: CKTOTAL, CKMB, CKMBINDEX, TROPONINI,  in the last 168 hours BNP (last 3 results) No results found for this basename: PROBNP,  in the last  8760 hours CBG: No results found for this basename: GLUCAP,  in the last 168 hours  Recent Results (from the past 240 hour(s))  BODY FLUID CULTURE     Status: None   Collection Time    09/04/14  1:12 PM      Result Value Ref Range Status   Specimen Description FLUID PANCREATIC CYST   Final   Special Requests NONE   Final   Gram Stain     Final   Value: ABUNDANT WBC PRESENT,BOTH PMN AND MONONUCLEAR     NO ORGANISMS SEEN     Performed at [facility]   Culture     Final   Value: NO GROWTH     Performed at [facility]   Report Status PENDING   Incomplete     Studies: No results found.  Scheduled Meds: . ciprofloxacin  500 mg Oral BID  . folic acid  1 mg Oral Daily  . heparin  5,000 Units Subcutaneous 3 times per day  . morphine  60 mg Oral Q12H  . pantoprazole  40 mg Oral Daily  . thiamine  100 mg Oral Daily   Continuous Infusions: . sodium chloride 75 mL/hr at 09/05/14 0216    Principal Problem:   Acute recurrent pancreatitis Active Problems:   Hypertension   Abdominal pain   Pancreatic pseudocyst   Pseudocyst of pancreas    Time spent: >35 minutes     Kinnie Feil  [facility] Pager [phone]. If 7PM-7AM, please contact night-coverage at [email or web address], password [email or web address] 09/05/2014, 9:56 AM  LOS: 6 days

## 2014-09-05 NOTE — Progress Notes (Signed)
[facility] Progress Note  Subjective: Still having significant amount of pain requiring Dilaudid night, possibly slightly worse since procedure yesterday.  Objective: Vital signs in last 24 hours: Temp:  [97.5 F (36.4 C)-98.1 F (36.7 C)] 98.1 F (36.7 C) (10/14 0640) Pulse Rate:  [58-98] 92 (10/14 0640) Resp:  [12-22] 12 (10/14 0640) BP: (109-145)/(78-95) 109/78 mmHg (10/14 0640) SpO2:  [95 %-100 %] 97 % (10/14 0640) Weight change:    PE: Abdomen soft tender over the epigastric  Lab Results: Results for orders placed during the hospital encounter of 08/30/14 (from the past 24 hour(s))  PANC CYST FLD ANLYS-PATHFNDR-TG     Status: None   Collection Time    09/04/14 12:50 PM      Result Value Ref Range   Pathfinder TG, Pancreatic Cyst Fluid SPECIMEN SENT TO [facility]    BODY FLUID CULTURE     Status: None   Collection Time    09/04/14  1:12 PM      Result Value Ref Range   Specimen Description FLUID PANCREATIC CYST     Special Requests NONE     Gram Stain PENDING     Culture       Value: NO GROWTH     Performed at [facility]   Report Status PENDING    COMPREHENSIVE METABOLIC PANEL     Status: None   Collection Time    09/05/14  5:00 AM      Result Value Ref Range   Sodium 141  137 - 147 mEq/L   Potassium 4.2  3.7 - 5.3 mEq/L   Chloride 100  96 - 112 mEq/L   CO2 28  19 - 32 mEq/L   Glucose, Bld 98  70 - 99 mg/dL   BUN 7  6 - 23 mg/dL   Creatinine, Ser 0.86  0.50 - 1.35 mg/dL   Calcium 9.7  8.4 - 10.5 mg/dL   Total Protein 7.2  6.0 - 8.3 g/dL   Albumin 4.2  3.5 - 5.2 g/dL   AST 27  0 - 37 U/L   ALT 38  0 - 53 U/L   Alkaline Phosphatase 54  39 - 117 U/L   Total Bilirubin 0.9  0.3 - 1.2 mg/dL   GFR calc non Af Amer >90  >90 mL/min   GFR calc Af Amer >90  >90 mL/min   Anion gap 13  5 - 15  CBC     Status: Abnormal   Collection Time    09/05/14  5:00 AM      Result Value Ref Range   WBC 8.4  4.0 - 10.5 K/uL   RBC 4.39  4.22 -  5.81 MIL/uL   Hemoglobin 14.1  13.0 - 17.0 g/dL   HCT 38.5 (*) 39.0 - 52.0 %   MCV 87.7  78.0 - 100.0 fL   MCH 32.1  26.0 - 34.0 pg   MCHC 36.6 (*) 30.0 - 36.0 g/dL   RDW 11.7  11.5 - 15.5 %   Platelets 265  150 - 400 K/uL    Studies/Results: No results found.    Assessment: 1. 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 Continue antibiotics for possible infection 2. Pain control continues to be a problem. 3. Probably go home in the next 2 days with followup with Dr. Paulita Fujita of pain controllable with out IV administration. May want to consider a fentanyl patch  if pain doesn't improve. 4. Await cultures and biopsies. 5. May ultimately need a surgical consultation, hopefully as an outpatient.    Celinda Dethlefs C 09/05/2014, 7:54 AM

## 2014-09-06 ENCOUNTER — Encounter (HOSPITAL_COMMUNITY): Payer: Self-pay | Admitting: Radiology

## 2014-09-06 ENCOUNTER — Inpatient Hospital Stay (HOSPITAL_COMMUNITY): Payer: BC Managed Care – PPO

## 2014-09-06 DIAGNOSIS — K859 Acute pancreatitis, unspecified: Secondary | ICD-10-CM

## 2014-09-06 DIAGNOSIS — I10 Essential (primary) hypertension: Secondary | ICD-10-CM

## 2014-09-06 DIAGNOSIS — R109 Unspecified abdominal pain: Secondary | ICD-10-CM

## 2014-09-06 MED ORDER — HYDROMORPHONE HCL 2 MG PO TABS
2.0000 mg | ORAL_TABLET | Freq: Once | ORAL | Status: AC
  Administered 2014-09-06: 2 mg via ORAL
  Filled 2014-09-06: qty 1

## 2014-09-06 MED ORDER — BISACODYL 10 MG RE SUPP
10.0000 mg | Freq: Once | RECTAL | Status: AC
Start: 2014-09-06 — End: 2014-09-06
  Administered 2014-09-06: 10 mg via RECTAL
  Filled 2014-09-06: qty 1

## 2014-09-06 MED ORDER — FLEET ENEMA 7-19 GM/118ML RE ENEM
1.0000 | ENEMA | Freq: Once | RECTAL | Status: DC
  Filled 2014-09-06: qty 1

## 2014-09-06 MED ORDER — HYDROMORPHONE HCL 2 MG PO TABS
1.0000 mg | ORAL_TABLET | ORAL | Status: DC | PRN
  Administered 2014-09-06 (×3): 1 mg via ORAL
  Filled 2014-09-06 (×3): qty 1

## 2014-09-06 MED ORDER — POLYETHYLENE GLYCOL 3350 17 G PO PACK
17.0000 g | PACK | Freq: Every day | ORAL | Status: DC
  Administered 2014-09-06 – 2014-09-08 (×3): 17 g via ORAL
  Filled 2014-09-06 (×4): qty 1

## 2014-09-06 MED ORDER — MORPHINE SULFATE ER 60 MG PO TBCR: 80.0000 mg | EXTENDED_RELEASE_TABLET | Freq: Two times a day (BID) | ORAL | Status: DC

## 2014-09-06 MED ORDER — IOHEXOL 300 MG/ML  SOLN
100.0000 mL | Freq: Once | INTRAMUSCULAR | Status: AC | PRN
  Administered 2014-09-06: 100 mL via INTRAVENOUS

## 2014-09-06 MED ORDER — MORPHINE SULFATE ER 15 MG PO TBCR
75.0000 mg | EXTENDED_RELEASE_TABLET | Freq: Two times a day (BID) | ORAL | Status: DC
  Administered 2014-09-06: 75 mg via ORAL
  Filled 2014-09-06: qty 5

## 2014-09-06 NOTE — Care Management Note (Signed)
  Page 1 of 1   09/06/2014     10:49:05 AM CARE MANAGEMENT NOTE 09/06/2014  Patient:  Richard Cantu, Richard Cantu   Account Number:  [id number]  Date Initiated:  09/04/2014  Documentation initiated by:  Magdalen Spatz  Subjective/Objective Assessment:     Action/Plan:   Anticipated DC Date:     Anticipated DC Plan:           Choice offered to / List presented to:             Status of service:   Medicare Important Message given?  YES (If response is "NO", the following Medicare IM given date fields will be blank) Date Medicare IM given:  09/06/2014 Medicare IM given by:  Magdalen Spatz Date Additional Medicare IM given:   Additional Medicare IM given by:    Discharge Disposition:    Per UR Regulation:    If discussed at Long Length of Stay Meetings, dates discussed:   09/04/2014  09/06/2014    Comments:  09-06-14 IM left at bedside. Magdalen Spatz RN BSN [phone]

## 2014-09-06 NOTE — Progress Notes (Signed)
09/06/14 1000  Clinical Encounter Type  Visited With Patient;Health care provider  Visit Type Initial;Spiritual support  Referral From Nurse  Spiritual Encounters  Spiritual Needs Prayer;Emotional  Stress Factors  Patient Stress Factors Family relationships;Health changes   Chaplain visited with patient for roughly 30 min. Chaplain was referred to patient via patient's nurse. Patient recently received news of having cancer. Patient expressed to chaplain that he is afraid. Chaplain affirmed patient's feelings of being afraid. Patient talked about his faith journey which is complex. Patient has felt hurt by faith communities in the past. Patient also is wrestling with feelings of guilt from his past. Patient has yet to inform his family about cancer and is worried about breaking the news because he feels he needs to be strong. Chaplain affirmed this fear of the patient but encouraged the patient that letting his family know will allow them to give them the love and support he needs while he is going through health changes and the fear that accompanies these changes. Patient has a deep love for his son and thinking of his son brought him to tears. Patient asked Chaplain for prayer and Chaplain prayed with the patient. Chaplain will continue to provide emotional and spiritual support for patient as needed. Chaplain has also offered to be there to support patient when he tells his family of medical changes if the patient needs this support. Adis Sturgill, Claudius Sis, Chaplain 10:13 AM

## 2014-09-06 NOTE — Progress Notes (Signed)
Patient repts no BM for a week--Miralax has been started, but I will order a suppository for tonight and an enema in the morning if that is not effective.  Instructed pt in Miralax dose titration.  Cleotis Nipper, M.D. [phone]

## 2014-09-06 NOTE — Progress Notes (Signed)
[facility] PROGRESS NOTE  Richard Cantu SVX:[id number] DOB: 1956-11-16 DOA: 08/30/2014 PCP: Donnie Coffin, MD  Assessment/Plan: 58 year old male with history of alcohol abuse, quit drinking in September 2015, chronic pancreatitis with pseudocyst, presented to the ER with worsening upper abdominal pain. Per patient, he has been having significant pain over the last 1 month, has been using Percocet every 6 hours but worsened in the last 2 days. Patient reports that he has been unable to eat for the last 1 week and has been trying to eat pure diet. He has been having nausea, belching and had 1 episode of vomiting today and has been having significant nausea in the last week. Patient has been followed by Dr. Paulita Fujita with gastroenterology.  He recently had endoscopy ultrasound done on 9/11 which showed chronic pancreatitis, 3x3 cm pancreatic pseudocyst. CT abdomen showed 4.9 x 3.5 cm pancreatic head mass, likely pancreatic pseudocyst. GI has been consulted by EDP. Patient will be admitted for acute on chronic pancreatitis.  1. Pancreatic mass suspicious for malignancy with elevated CA 19.9  -CT: Stable ill-defined 4.9 x 3.5 cm mass pancreatic head most consistent pancreatic carcinoma. -history of chronic alcoholic pancreatitis.Lipase is stable. on atx per GI for probable infection  -10/13: s/p EUS/biopsy pend path; prelim adenocarcinoma; consulted oncology, surgery; obtain CT chest r/o mets   2. Essential hypertension. Not on home meds; stable;  3. History of alcohol abuse. Quit one month ago. Place on folic acid and thiamine. Counseled to continue abstaining.  4. Low potassium. Replaced 5. abd pain with pancreatic mass lesionl;  -started on scheduled opioids increased MS contin 60-->75 BID; d/c IV dilaudid; changed to PO dilaudid prn;  cont to titrate as needed; monitor for overdose;  -added bowel regimen; outpatient follow up with pain clinic    Code Status: full Family Communication:   D/w patient, his friend (indicate person spoken with, relationship, and if by phone, the number) Disposition Plan: home 24-48 hrs pend surgery,oncology eval    Consultants:  GI  Procedures:  EUS  Antibiotics:  none (indicate start date, and stop date if known)  HPI/Subjective: alert  Objective: Filed Vitals:   09/06/14 0941  BP: 110/79  Pulse: 94  Temp: 97.6 F (36.4 C)  Resp: 17    Intake/Output Summary (Last 24 hours) at 09/06/14 1302 Last data filed at 09/06/14 0500  Gross per 24 hour  Intake      0 ml  Output   3600 ml  Net  -3600 ml   Filed Weights   08/30/14 0029  Weight: 83.689 kg (184 lb 8 oz)    Exam:   General:  alert  Cardiovascular: s1,s2 rrr  Respiratory: CTA BL  Abdomen: soft, nt,nd   Musculoskeletal: no LE edema    Data Reviewed: Basic Metabolic Panel:  Recent Labs Lab 08/31/14 0439 09/02/14 0940 09/03/14 0820 09/04/14 0507 09/05/14 0500  NA 140 137 140 143 141  K 4.3 3.5* 3.6* 4.5 4.2  CL 100 101 100 102 100  CO2 26 24 27 26 28   GLUCOSE 93 104* 88 87 98  BUN 11 16 9 6 7   CREATININE 0.79 0.82 0.79 0.80 0.86  CALCIUM 9.3 8.8 9.1 9.5 9.7  MG  --   --   --  2.1  --    Liver Function Tests:  Recent Labs Lab 09/02/14 0940 09/03/14 0820 09/04/14 0507 09/05/14 0500  AST 19 21 24 27   ALT 20 24 29  38  ALKPHOS 45 48 50 54  BILITOT  0.8 0.7 0.8 0.9  PROT 6.7 6.9 7.1 7.2  ALBUMIN 4.0 4.0 4.1 4.2    Recent Labs Lab 09/02/14 0940  LIPASE 102*   No results found for this basename: AMMONIA,  in the last 168 hours CBC:  Recent Labs Lab 08/30/14 1430 08/31/14 0439 09/04/14 0507 09/05/14 0500  WBC 9.8 9.0 7.0 8.4  HGB 15.9 15.0 13.8 14.1  HCT 43.0 41.2 38.4* 38.5*  MCV 88.1 87.1 90.1 87.7  PLT 306 299 264 265   Cardiac Enzymes: No results found for this basename: CKTOTAL, CKMB, CKMBINDEX, TROPONINI,  in the last 168 hours BNP (last 3 results) No results found for this basename: PROBNP,  in the last 8760  hours CBG: No results found for this basename: GLUCAP,  in the last 168 hours  Recent Results (from the past 240 hour(s))  BODY FLUID CULTURE     Status: None   Collection Time    09/04/14  1:12 PM      Result Value Ref Range Status   Specimen Description FLUID PANCREATIC CYST   Final   Special Requests NONE   Final   Gram Stain     Final   Value: ABUNDANT WBC PRESENT,BOTH PMN AND MONONUCLEAR     NO ORGANISMS SEEN     Performed at [facility]   Culture     Final   Value: NO GROWTH 1 DAY     Performed at [facility]   Report Status PENDING   Incomplete     Studies: No results found.  Scheduled Meds: . ciprofloxacin  500 mg Oral BID  . feeding supplement (RESOURCE BREEZE)  1 Container Oral TID BM  . folic acid  1 mg Oral Daily  . heparin  5,000 Units Subcutaneous 3 times per day  . morphine  60 mg Oral Q12H  . pantoprazole  40 mg Oral Daily  . senna-docusate  1 tablet Oral BID  . thiamine  100 mg Oral Daily   Continuous Infusions:    Principal Problem:   Acute recurrent pancreatitis Active Problems:   Hypertension   Abdominal pain   Pancreatic pseudocyst   Pseudocyst of pancreas    Time spent: >35 minutes     Kinnie Feil  [facility] Pager [phone]. If 7PM-7AM, please contact night-coverage at [email or web address], password [facility] 09/06/2014, 1:02 PM  LOS: 7 days

## 2014-09-06 NOTE — Progress Notes (Signed)
Patient complaining of pain unrelieved by 1mg  po Dilaudid given at 2148.  Complaining of abdominal pain which doesn't stop.  He says it's the worst pain a body can have.  Paged on call provider.

## 2014-09-06 NOTE — Consult Note (Signed)
[place]  Telephone:(336) [place] He[place]avar[place]  DOB: 1956/06/18  MR#: [id number]  CSN#: [id number]    Requesting Physician: [facility]    Patient Care Team: Donnie Coffin, MD as PCP - General (Family Medicine)  History of present illness: Pancreatic Cancer        58 y.o.  male male with history of heavy alcohol use presented with progressive postprandial abdominal pain, early satiation and a 30-lb weight loss since May 2015. CT of the abdomen and pelvis with contrast as outpatient showed a lesion in head of pancreas. A repeat CT on 10/8,of the abdomen and pelvis taken after discontinuing alcohol for a month without improvement of symptoms showed a stable ill-defined 4.9 x 3.5 cm mass pancreatic head most consistent pancreatic carcinoma, as well as stable adjacent peripancreatic and retroperitoneal lymph nodes with possible SMA involvement. No evidence of biliary obstruction.  He was unable to eat more than liquids without having significant pain and nausea and vomiting. Therefore, he was admitted on 10/13 for management of symptoms and further evaluation. No family history of GI cancer.Denies risk factors for HIV or hepatitis. Denies Diabetes.  He had  a repeat EUS on 10/13 (had one on 9/11 remarkable for pancreatitis), Dr. Paulita Fujita, with path report, case number  [id number], was positive for adenocarcinoma. Pancreatic lymph node was negative. Fine needle aspiration of the pancreatic cyst case [id number]  was positive for adenocarcinoma. CEA 1.8  CA 19.9 169.1.  Based on this diagnosis, Oncology consultation was requested.    ROS: Constitutional: Denies fevers, chills or abnormal night sweats. Lost 30 lbs since may Eyes: Denies blurriness of vision, double vision or watery eyes Ears, nose, mouth, throat, and face: Denies mucositis or sore throat Respiratory: Denies cough, dyspnea or wheezes Cardiovascular: Denies palpitation,  chest discomfort or lower extremity swelling, denies varicosities. Gastrointestinal:  Denies nausea, heartburn or change in bowel habits such as steatorrhea Skin: Denies abnormal skin rashes or pruritus Lymphatics: Denies new lymphadenopathy or easy bruising Neurological: Denies numbness, tingling or new weaknesses. Denies spinal tenderness. Behavioral/Psych: Mood is stable, no new changes  All other systems were reviewed with the patient and are negative.   Family History:    Father has COPD, mother with arthritis. No family history of malignancies  Social History:  reports that he has quit smoking  1 ppd for 20 years several years ago  He has never used smokeless tobacco. He reports that he drinks alcohol currently averages about 4 beers a day- although heavier in the recent past-, but has quit "cold [place]" one month ago. He reports that he does not use illicit drugs. Lives in [place]. Divorced. He works at a [employer].   Physical Exam    ECOG PERFORMANCE STATUS: 2   Filed Vitals:   09/06/14 0941  BP: 110/79  Pulse: 94  Temp: 97.6 F (36.4 C)  Resp: 17   Filed Weights   08/30/14 0029  Weight: 184 lb 8 oz (83.689 kg)    GENERAL:alert, no distress and uncomfortable,looks younger than stated age SKIN: skin color, texture, turgor are normal without jaundice, no rashes or significant lesions EYES: normal, conjunctiva are pink and non-injected, sclera clear OROPHARYNX: no exudate, no erythema and lips, buccal mucosa, and tongue normal  NECK: supple, thyroid normal size, non-tender, without nodularity LYMPH:  no palpable lymphadenopathy in the cervical, supraclavicular, axillary or inguinal areas LUNGS: clear to auscultation and percussion with normal breathing effort HEART:  regular rate & rhythm and no murmurs and no lower extremity edema ABDOMEN:abdomen soft,tender at the epigastric area, and normal bowel sounds. No periumbilical masses. Musculoskeletal:no cyanosis of  digits and no clubbing  PSYCH: alert & oriented x 3 with fluent speech NEURO: no focal motor/sensory deficits    Labs:  CBC   Recent Labs Lab 08/30/14 1430 08/31/14 0439 09/04/14 0507 09/05/14 0500  WBC 9.8 9.0 7.0 8.4  HGB 15.9 15.0 13.8 14.1  HCT 43.0 41.2 38.4* 38.5*  PLT 306 299 264 265  MCV 88.1 87.1 90.1 87.7  MCH 32.6 31.7 32.4 32.1  MCHC 37.0* 36.4* 35.9 36.6*  RDW 11.4* 11.4* 11.8 11.7     CMP    Recent Labs Lab 08/31/14 0439 09/02/14 0940 09/03/14 0820 09/04/14 0507 09/05/14 0500  NA 140 137 140 143 141  K 4.3 3.5* 3.6* 4.5 4.2  CL 100 101 100 102 100  CO2 26 24 27 26 28   GLUCOSE 93 104* 88 87 98  BUN 11 16 9 6 7   CREATININE 0.79 0.82 0.79 0.80 0.86  CALCIUM 9.3 8.8 9.1 9.5 9.7  MG  --   --   --  2.1  --   AST  --  19 21 24 27   ALT  --  20 24 29  38  ALKPHOS  --  45 48 50 54  BILITOT  --  0.8 0.7 0.8 0.9        Component Value Date/Time   BILITOT 0.9 09/05/2014 0500      Recent Labs Lab 09/01/14 1100  INR 1.14    No results found for this basename: DDIMER,  in the last 72 hours   Anemia panel:  No results found for this basename: VITAMINB12, FOLATE, FERRITIN, TIBC, IRON, RETICCTPCT,  in the last 72 hours   Imaging Studies:  Ct Abdomen Pelvis W Contrast  08/30/2014   CLINICAL DATA:  Chronic pancreatitis. Nausea vomiting. Followup exam.  EXAM: CT ABDOMEN AND PELVIS WITH CONTRAST  TECHNIQUE: Multidetector CT imaging of the abdomen and pelvis was performed using the standard protocol following bolus administration of intravenous contrast.  CONTRAST:  113m OMNIPAQUE IOHEXOL 300 MG/ML  SOLN  COMPARISON:  CT 07/31/2014.  FINDINGS: No focal hepatic abnormality. Spleen normal. Ill-defined complex pancreatic head mass is again noted. The mass measures 4.9 by 3.5 cm. Central lucency is present suggesting necrosis. This finding is highly suspicious with pancreatic cancer. Adjacent nodular PA pancreatic densities consistent peripancreatic  lymph nodes are again noted and unchanged. These measure up to approximately 1 cm. 1 cm retroperitoneal lymph nodes are again noted. No associated biliary distention. Gallbladder is nondistended. Dorsal torsion of the fat planes about the mesenteric vasculature noted with possible narrowing of the SMA. Portal vein is patent.  Adrenals are normal. Kidneys are normal. No hydronephrosis or obstructing ureteral stone. The bladder is nondistended. Prostate is slightly enlarged.  No significant inguinal adenopathy. As noted above 1 cm retroperitoneal adenopathy is present along with peripancreatic adenopathy.  Appendix normal. No bowel distention. No free air. No gastric distention. No significant abdominal wall hernia.  Mild basilar atelectasis. Heart size normal. No acute bony abnormality. Diffuse degenerative changes lumbar spine, thickening prominent at L5-S1.  IMPRESSION: 1. 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 Stable adjacent peripancreatic and retroperitoneal lymph nodes. SMA involvement cannot be excluded. No evidence of biliary obstruction. Similar findings noted on prior CT of 07/31/2014. 2. No acute intra-abdominal abnormality identified.   Electronically  Signed   ByMarcello Moores  Register   On: 08/30/2014 09:45      Accession: [id number] Received: 09/04/2014 Satira Anis. Paulita Fujita, MD DOB: July 27, 1956 Age: 41 Gender: M Reported: 09/05/2014 [address] Patient Ph: [phone] MRN#: [id number] [place], [place] [address] Client Acc#: Chart: Phone: [phone] Fax: LMP: Visit#: [id number] CC: CYTOPATHOLOGY REPORT Adequacy Reason Satisfactory For Evaluation. Diagnosis FINE NEEDLE ASPIRATION: NEEDLE ASPIRATION, EUS, PANCREAS (SPECIMEN 2 OF 3, COLLECTED ON 09/04/14): NECROSIS AND ATYPICAL CELLS, HIGHLY SUSPICIOUS FOR ADENOCARCINOMA. Preliminary Diagnosis Intraoperative Diagnosis: Rare benign glandular cells, necrotic material. [place] Small) Aldona Bar  MD Pathologist, Electronic Signature   Accession: [id number] Received: 09/04/2014 Satira Anis. Paulita Fujita, MD DOB: [birth year] Age: 89 Gender: M Reported: 09/05/2014 [address] Patient Ph: [phone] MRN#: [id number] [place], [place] [address] Client Acc#: Chart: Phone: [phone] Fax: LMP: Visit#: [id number] CC: CYTOPATHOLOGY REPORT Adequacy Reason Satisfactory For Evaluation. Diagnosis FINE NEEDLE ASPIRATION: NEEDLE ASPIRATION: NEEDLE ASPIRATION, EGUS, PANCREATIC LYMPH NODE (SPECIMEN 1 OF 3, COLLECTED ON 09/04/14):SMALL LYMPHOCYTES. NO MALIGNANT CELLS IDENTIFIED. Preliminary Diagnosis Intraoperative Diagnosis: Blood ([facility]) Aldona Bar MD Pathologist, Electronic Signature (Case signed 09/05/2014)    Patient: MCIHAEL, HINDERMAN Collected: 09/04/2014 Client: [facility] Accession: [id number] Received: 09/04/2014 Satira Anis. Paulita Fujita, MD DOB: December 09, 1955 Age: 35 Gender: M Reported: 09/05/2014 [address] Patient Ph: [phone] MRN#: [id number] [place], [place] [address] Client Acc#: Chart: Phone: [phone] Fax: LMP: Visit#: [id number] CC: Lala Lund, MD CYTOPATHOLOGY REPORT Adequacy Reason Satisfactory For Evaluation. Diagnosis PANCREATIC CYST, FINE NEEDLE ASPIRATION (SPECIMEN 3 OF 3, COLLECTED ON 09/05/14):MALIGNANT CELLS AND NECROSIS, CONSISTENT WITH ADENOCARCINOMA. Aldona Bar MD Pathologist, Electronic Signature (Case signed 09/05/2014)  A/P: 58 y.o. male   1. Pancreatic Cancer CT of the abdomen and pelvis revealed a large pancreatic head mass suspicious for malignancy EUS on 10/13  showed the mass to be positive for adenocarcinoma. Pancreatic lymph node was negative. Fine needle aspiration of the pancreatic cys was positive for adenocarcinoma as well.  CA 19.9 is elevated consistent with diagnosis Completion of staging CTs while hospitalized recommended to rule out occult malignancy.  Surgery to see patient on 10/16 Treatment options to be discussed  with Dr. Benay Spice, Oncology.   2.Malnutrition  Secondary to #1  He has lost about 30 lbs since may 2015 due to symptoms Nutrition evaluation recommended   3. Alcohol Abuse 4. Acute on chronic pancreatitis Alcohol cessation counseling recommended   5. Full Code   ADDENDUM: I met with Md Zani chiefly to orient him as to his diagnosis. We are waiting on Dr Marlowe Aschoff opinion regarding possible resectability. If the patient is felt to be an operative candidate, Dr Benay Spice will see him 7-10 days post op. If he is not an operative candidate he will see him next week.  HEMATOLOGY ONCOLOGY COURTESY NOTE      Chauncey Cruel, MD

## 2014-09-06 NOTE — Progress Notes (Signed)
Discussed case w/ Drs. Buriev (hospitalist), Benay Spice (oncology), and [facility] (surgery).  Chest CT just performed.  Dr. Barry Dienes plans to see pt tomorrow/; Dr. Benay Spice prefers to see pt as outpt (please sched new pt visit with him).  RECOMMEND:  1.  Frequent small high-calorie and/or high-protein meals--have requested dietitian consult 2.  Make appt w/ Dr. Benay Spice as noted above 3.  Adjust pain medication for an effective regimen than can be done at home  Will sign off, call if we can be of further help.  Cleotis Nipper, M.D. [phone]

## 2014-09-07 DIAGNOSIS — C250 Malignant neoplasm of head of pancreas: Principal | ICD-10-CM

## 2014-09-07 MED ORDER — ALPRAZOLAM 0.5 MG PO TABS
0.5000 mg | ORAL_TABLET | Freq: Once | ORAL | Status: AC
  Administered 2014-09-07: 0.5 mg via ORAL
  Filled 2014-09-07: qty 1

## 2014-09-07 MED ORDER — HYDROMORPHONE HCL 2 MG PO TABS
4.0000 mg | ORAL_TABLET | ORAL | Status: DC | PRN
  Administered 2014-09-07 (×3): 4 mg via ORAL
  Administered 2014-09-08: 2 mg via ORAL
  Administered 2014-09-08 (×3): 4 mg via ORAL
  Filled 2014-09-07 (×7): qty 2

## 2014-09-07 MED ORDER — ALPRAZOLAM 0.5 MG PO TABS
1.0000 mg | ORAL_TABLET | Freq: Three times a day (TID) | ORAL | Status: DC | PRN
  Administered 2014-09-07: 1 mg via ORAL
  Filled 2014-09-07: qty 2

## 2014-09-07 MED ORDER — FENTANYL 50 MCG/HR TD PT72
75.0000 ug | MEDICATED_PATCH | TRANSDERMAL | Status: DC
  Administered 2014-09-07: 75 ug via TRANSDERMAL
  Filled 2014-09-07 (×2): qty 1

## 2014-09-07 MED ORDER — HYDROMORPHONE HCL 2 MG PO TABS
4.0000 mg | ORAL_TABLET | Freq: Four times a day (QID) | ORAL | Status: DC | PRN
  Administered 2014-09-07: 4 mg via ORAL
  Filled 2014-09-07: qty 2

## 2014-09-07 MED ORDER — HYDROMORPHONE HCL 2 MG PO TABS
2.0000 mg | ORAL_TABLET | Freq: Once | ORAL | Status: AC
  Administered 2014-09-07: 2 mg via ORAL
  Filled 2014-09-07: qty 1

## 2014-09-07 MED ORDER — HYDROMORPHONE HCL 1 MG/ML IJ SOLN
1.0000 mg | Freq: Once | INTRAMUSCULAR | Status: AC
Start: 2014-09-07 — End: 2014-09-07
  Administered 2014-09-07: 1 mg via INTRAVENOUS
  Filled 2014-09-07: qty 1

## 2014-09-07 MED ORDER — ENSURE COMPLETE PO LIQD
237.0000 mL | Freq: Two times a day (BID) | ORAL | Status: DC
  Administered 2014-09-08: 237 mL via ORAL

## 2014-09-07 NOTE — Progress Notes (Signed)
[facility] PROGRESS NOTE  Richard Cantu YIR:[id number] DOB: Feb 28, 1956 DOA: 08/30/2014 PCP: Donnie Coffin, MD  Assessment/Plan: 58 year old male with history of alcohol abuse, quit drinking in September 2015, chronic pancreatitis with pseudocyst, presented to the ER with worsening upper abdominal pain. Per patient, he has been having significant pain over the last 1 month, has been using Percocet every 6 hours but worsened in the last 2 days. Patient reports that he has been unable to eat for the last 1 week and has been trying to eat pure diet. He has been having nausea, belching and had 1 episode of vomiting today and has been having significant nausea in the last week. Patient has been followed by Dr. Paulita Fujita with gastroenterology.  He recently had endoscopy ultrasound done on 9/11 which showed chronic pancreatitis, 3x3 cm pancreatic pseudocyst. CT abdomen showed 4.9 x 3.5 cm pancreatic head mass, likely pancreatic pseudocyst. GI has been consulted by EDP. Patient will be admitted for acute on chronic pancreatitis.  1. Pancreatic mass suspicious for malignancy with elevated CA 19.9  -CT: Stable ill-defined 4.9 x 3.5 cm mass pancreatic head most consistent pancreatic carcinoma.CT chest no mets  -10/13: s/p EUS/biopsy pend path; prelim adenocarcinoma;  -history of chronic alcoholic pancreatitis.Lipase is stable. on atx per GI for probable infection  -awaiting surgery evaluation; oncology follow up as outpatient  2. Essential hypertension. Not on home meds; stable;  3. History of alcohol abuse. Quit one month ago. Place on folic acid and thiamine. Counseled to continue abstaining.  4. Low potassium. Replaced 5. Abdominal pain with pancreatic CA;  -Pain is not well controled on scheduled opioids increased MS contin 60-->75 BID; PO dilaudid prn;   -d/w patient he wants to try fentanyl (start 75 mcg), cont dilaudid 4 mg Q3 prn; titrate as needed; unclear if patient is opioid naive ? monitor  for overdose;  -added bowel regimen; Pt had BM post enema;  outpatient follow up with pain clinic    Code Status: full Family Communication:  D/w patient, his friend (indicate person spoken with, relationship, and if by phone, the number) Disposition Plan: home 24-48 hrs pend surgery,oncology eval    Consultants:  GI  Procedures:  EUS  Antibiotics:  none (indicate start date, and stop date if known)  HPI/Subjective: alert  Objective: Filed Vitals:   09/07/14 0620  BP: 151/94  Pulse: 88  Temp: 98.2 F (36.8 C)  Resp: 18    Intake/Output Summary (Last 24 hours) at 09/07/14 0853 Last data filed at 09/07/14 0622  Gross per 24 hour  Intake    450 ml  Output   1350 ml  Net   -900 ml   Filed Weights   08/30/14 0029  Weight: 83.689 kg (184 lb 8 oz)    Exam:   General:  alert  Cardiovascular: s1,s2 rrr  Respiratory: CTA BL  Abdomen: soft, nt,nd   Musculoskeletal: no LE edema    Data Reviewed: Basic Metabolic Panel:  Recent Labs Lab 09/02/14 0940 09/03/14 0820 09/04/14 0507 09/05/14 0500  NA 137 140 143 141  K 3.5* 3.6* 4.5 4.2  CL 101 100 102 100  CO2 24 27 26 28   GLUCOSE 104* 88 87 98  BUN 16 9 6 7   CREATININE 0.82 0.79 0.80 0.86  CALCIUM 8.8 9.1 9.5 9.7  MG  --   --  2.1  --    Liver Function Tests:  Recent Labs Lab 09/02/14 0940 09/03/14 0820 09/04/14 0507 09/05/14 0500  AST 19 21  24 27  ALT 20 24 29  38  ALKPHOS 45 48 50 54  BILITOT 0.8 0.7 0.8 0.9  PROT 6.7 6.9 7.1 7.2  ALBUMIN 4.0 4.0 4.1 4.2    Recent Labs Lab 09/02/14 0940  LIPASE 102*   No results found for this basename: AMMONIA,  in the last 168 hours CBC:  Recent Labs Lab 09/04/14 0507 09/05/14 0500  WBC 7.0 8.4  HGB 13.8 14.1  HCT 38.4* 38.5*  MCV 90.1 87.7  PLT 264 265   Cardiac Enzymes: No results found for this basename: CKTOTAL, CKMB, CKMBINDEX, TROPONINI,  in the last 168 hours BNP (last 3 results) No results found for this basename: PROBNP,  in  the last 8760 hours CBG: No results found for this basename: GLUCAP,  in the last 168 hours  Recent Results (from the past 240 hour(s))  BODY FLUID CULTURE     Status: None   Collection Time    09/04/14  1:12 PM      Result Value Ref Range Status   Specimen Description FLUID PANCREATIC CYST   Final   Special Requests NONE   Final   Gram Stain     Final   Value: ABUNDANT WBC PRESENT,BOTH PMN AND MONONUCLEAR     NO ORGANISMS SEEN     Performed at [facility]   Culture     Final   Value: NO GROWTH 2 DAYS     Performed at [facility]   Report Status PENDING   Incomplete     Studies: Ct Chest W Contrast  09/06/2014   CLINICAL DATA:  Newly diagnosed pancreatic carcinoma.  EXAM: CT CHEST WITH CONTRAST  TECHNIQUE: Multidetector CT imaging of the chest was performed during intravenous contrast administration.  CONTRAST:  155mL OMNIPAQUE IOHEXOL 300 MG/ML  SOLN  COMPARISON:  Recent abdomen CT on 08/30/2014  FINDINGS: Mediastinum/Hilar Regions: No masses or pathologically enlarged lymph nodes identified.  Other Thoracic Lymphadenopathy:  None.  Lungs:  No pulmonary infiltrate or mass identified.  Pleura:  No evidence of effusion or mass.  Vascular/Cardiac:  No acute findings identified.  Other:  None.  Musculoskeletal:  No suspicious bone lesions identified.  IMPRESSION: No evidence of thoracic metastatic disease or other acute findings.   Electronically Signed   By: Earle Gell M.D.   On: 09/06/2014 16:33    Scheduled Meds: . ciprofloxacin  500 mg Oral BID  . feeding supplement (RESOURCE BREEZE)  1 Container Oral TID BM  . fentaNYL  75 mcg Transdermal Q72H  . folic acid  1 mg Oral Daily  . heparin  5,000 Units Subcutaneous 3 times per day  .  HYDROmorphone (DILAUDID) injection  1 mg Intravenous Once  . pantoprazole  40 mg Oral Daily  . polyethylene glycol  17 g Oral Daily  . senna-docusate  1 tablet Oral BID  . sodium phosphate  1 enema Rectal Once  . thiamine  100 mg  Oral Daily   Continuous Infusions:    Principal Problem:   Acute recurrent pancreatitis Active Problems:   Hypertension   Abdominal pain   Pancreatic pseudocyst   Pseudocyst of pancreas    Time spent: >35 minutes     Kinnie Feil  [facility] Pager [phone]. If 7PM-7AM, please contact night-coverage at [email or web address], password [facility] 09/07/2014, 8:53 AM  LOS: 8 days

## 2014-09-07 NOTE — Progress Notes (Signed)
Patient ID: Richard Cantu, male   DOB: 11-Dec-1955, 58 y.o.   MRN: [id number]  Pt with new diagnosis of adenocarcinoma of the pancreatic head.  Briefly discussed recommendation of neoadjuvant chemoradiation.  Pt getting appt set up for oncology.  Will also set up appt with radiation oncology.    I will see back in clinic in a few weeks to further set up timeline for surgery.

## 2014-09-07 NOTE — Progress Notes (Signed)
RD consulted for education regarding High-Calorie, High-Protein nutrition therapy.  RD provided "High-Calorie High-Protein Nutrition Therapy" handout from the Academy of [employer]. Reviewed patient's dietary recall. Provided examples on ways to increase caloric density of foods and beverages frequently consumed by the patient. Also provided ideas to promote variety and to incorporate additional nutrient dense foods into patient's diet. Discussed eating small frequent meals and snacks to assist in increasing overall po intake. Teach back method used.  Expect very good compliance.  Body mass index is 27.23 kg/(m^2). Pt meets criteria for overweight based on current BMI.  Current diet order is Heart Healthy, patient is consuming approximately 25% of meals at this time. Labs and medications reviewed.   Pt revealed that his appetite has become worse due to abdominal pain. He reports he is drinking the [place] and really enjoys it, however, he requests supplement with the greatest nutrient density. Resource D/c and added Ensure Complete po BID, each supplement provides 350 kcal and 13 grams of protein, per pt request. Also discussed supplement options to use at home. Pt very grateful for RD visit.   RD will continue to follow  Tomorrow Dehaas A. Jimmye Norman, RD, LDN Pager: [phone] After hours Pager: [phone]

## 2014-09-08 DIAGNOSIS — R1084 Generalized abdominal pain: Secondary | ICD-10-CM

## 2014-09-08 LAB — BODY FLUID CULTURE: CULTURE: NO GROWTH

## 2014-09-08 MED ORDER — FENTANYL 75 MCG/HR TD PT72: 75.0000 ug | MEDICATED_PATCH | TRANSDERMAL | Status: DC

## 2014-09-08 MED ORDER — HYDROMORPHONE HCL 4 MG PO TABS: 4.0000 mg | ORAL_TABLET | ORAL | Status: DC | PRN

## 2014-09-08 MED ORDER — CIPROFLOXACIN HCL 500 MG PO TABS: 500.0000 mg | ORAL_TABLET | Freq: Two times a day (BID) | ORAL | Status: DC

## 2014-09-08 MED ORDER — METRONIDAZOLE 500 MG PO TABS: 500.0000 mg | ORAL_TABLET | Freq: Three times a day (TID) | ORAL | Status: DC

## 2014-09-08 MED ORDER — POLYETHYLENE GLYCOL 3350 17 G PO PACK: 17.0000 g | PACK | Freq: Every day | ORAL | Status: DC

## 2014-09-08 MED ORDER — ALPRAZOLAM 1 MG PO TABS: 1.0000 mg | ORAL_TABLET | Freq: Every evening | ORAL | Status: DC | PRN

## 2014-09-08 MED ORDER — SENNOSIDES-DOCUSATE SODIUM 8.6-50 MG PO TABS: 1.0000 | ORAL_TABLET | Freq: Two times a day (BID) | ORAL | Status: DC

## 2014-09-08 MED ORDER — ONDANSETRON HCL 8 MG PO TABS: 8.0000 mg | ORAL_TABLET | Freq: Three times a day (TID) | ORAL | Status: DC | PRN

## 2014-09-08 NOTE — Progress Notes (Signed)
AVS discharge instructions have given and went over with patient. Patient was also given prescriptions diluadid, zofran, cipro, flagyl, xanax, and fentanyl to take to his pharmacy. Patient stated  He did not have any questions. Staff assisted patient to his transportation.

## 2014-09-08 NOTE — Discharge Summary (Signed)
Physician Discharge Summary  Richard Cantu FAO:[id number] DOB: Sep 09, 1956 DOA: 08/30/2014  PCP: Donnie Coffin, MD  Admit date: 08/30/2014 Discharge date: 09/08/2014  Time spent: >35 minutes  Recommendations for Outpatient Follow-up:  F/u with oncology next week to schedule chemo/radiation F/u with surgery in 2 weeks  F/u with PCP in 1 week as needed Discharge Diagnoses:  Principal Problem:   Acute recurrent pancreatitis Active Problems:   Hypertension   Abdominal pain   Pancreatic pseudocyst   Pseudocyst of pancreas   Discharge Condition: stable   Diet recommendation: low sodium  Filed Weights   08/30/14 0029  Weight: 83.689 kg (184 lb 8 oz)    History of present illness:  58 year old male with history of alcohol abuse, quit drinking in September 2015, chronic pancreatitis with pseudocyst, presented to the ER with worsening upper abdominal pain. Per patient, he has been having significant pain over the last 1 month, has been using Percocet every 6 hours but worsened in the last 2 days. Patient reports that he has been unable to eat for the last 1 week and has been trying to eat pure diet. He has been having nausea, belching and had 1 episode of vomiting today and has been having significant nausea in the last week. Patient has been followed by Dr. Paulita Fujita with gastroenterology.  He recently had endoscopy ultrasound done on 9/11 which showed chronic pancreatitis, 3x3 cm pancreatic pseudocyst. CT abdomen showed 4.9 x 3.5 cm pancreatic head mass, likely pancreatic pseudocyst. GI has been consulted by EDP. Patient will be admitted for acute on chronic pancreatitis.   Hospital Course:  1. Pancreatic CA/ malignancy with elevated CA 19.9 -CT: Stable ill-defined 4.9 x 3.5 cm mass pancreatic head most consistent pancreatic carcinoma.CT chest no mets  -10/13: s/p EUS/biopsy pend path; prelim adenocarcinoma;  -per surgery, and oncology evaluation: plan is to f/u with oncology next week  to evaluate for chemo/radiation and then f/u with surgery to reevaluate for possible resection; d/w patient at length  -(history of chronic alcoholic pancreatitis.Lipase is stable. on atx per GI for probable infection) afebrile; stable  2. Essential hypertension. Not on home meds; stable;  3. History of alcohol abuse. Quit one month ago. Place on folic acid and thiamine. Counseled to continue abstaining.  4. Low potassium. Replaced  5. Intractable Abdominal pain with pancreatic CA;  -Pain was not well controled on scheduled opioids increased MS contin 60-->75 BID; PO dilaudid prn;  -But pain responded well to fentanyl (start 75 mcg), cont dilaudid 4 mg Q3 prn; d/w patient at lengh abotu overdose, side effects; recommended to f/u with pain clinic; added bowel regimen     Procedures:  EUS (i.e. Studies not automatically included, echos, thoracentesis, etc; not x-rays)  Consultations:  GI, oncology, surgery   Discharge Exam: Filed Vitals:   09/08/14 0513  BP: 107/84  Pulse: 87  Temp: 98.2 F (36.8 C)  Resp: 18    General: alert Cardiovascular: s1,s2 rrr Respiratory: CTA BL  Discharge Instructions  Discharge Instructions   Diet - low sodium heart healthy    Complete by:  As directed      Discharge instructions    Complete by:  As directed   Please follow up with Dr. Ammie Dalton (or Dr. Marjie Skiff) oncology next week Please follow up with surgery Dr. Barry Dienes in 2 weeks     Increase activity slowly    Complete by:  As directed             Medication List  STOP taking these medications       HYDROcodone-acetaminophen 5-325 MG per tablet  Commonly known as:  NORCO/VICODIN     oxyCODONE-acetaminophen 7.5-325 MG per tablet  Commonly known as:  PERCOCET      TAKE these medications       ALPRAZolam 1 MG tablet  Commonly known as:  XANAX  Take 1 tablet (1 mg total) by mouth at bedtime as needed for anxiety.     ciprofloxacin 500 MG tablet  Commonly known as:  CIPRO   Take 1 tablet (500 mg total) by mouth 2 (two) times daily.     fentaNYL 75 MCG/HR  Commonly known as:  DURAGESIC - dosed mcg/hr  Place 1 patch (75 mcg total) onto the skin every 3 (three) days.     HYDROmorphone 4 MG tablet  Commonly known as:  DILAUDID  Take 1 tablet (4 mg total) by mouth every 3 (three) hours as needed for severe pain.     ibuprofen 200 MG tablet  Commonly known as:  ADVIL,MOTRIN  Take 800 mg by mouth every 8 (eight) hours as needed for moderate pain.     lactose free nutrition Liqd  Take 237 mLs by mouth 4 (four) times daily.     lipase/protease/amylase 12000 UNITS Cpep capsule  Commonly known as:  CREON  Take 12,000 Units by mouth 3 (three) times daily with meals.     metroNIDAZOLE 500 MG tablet  Commonly known as:  FLAGYL  Take 1 tablet (500 mg total) by mouth 3 (three) times daily.     ondansetron 8 MG tablet  Commonly known as:  ZOFRAN  Take 1 tablet (8 mg total) by mouth every 8 (eight) hours as needed for nausea or vomiting.     pantoprazole 40 MG tablet  Commonly known as:  PROTONIX  Take 40 mg by mouth daily.     polyethylene glycol packet  Commonly known as:  MIRALAX / GLYCOLAX  Take 17 g by mouth daily.     PROBIOTIC DAILY PO  Take 1 tablet by mouth daily.     senna-docusate 8.6-50 MG per tablet  Commonly known as:  Senokot-S  Take 1 tablet by mouth 2 (two) times daily.       No Known Allergies     Follow-up Information   Follow up with Betsy Coder, MD. Schedule an appointment as soon as possible for a visit in 3 days.   Specialty:  Oncology   Contact information:   [place] [facility] [address] [phone]       Follow up with [facility], MD. Schedule an appointment as soon as possible for a visit in 2 weeks.   Specialty:  General Surgery   Contact information:   [address] [place] [address] [phone]       Follow up with [facility], MD In 1 week.   Specialty:  Family  Medicine   Contact information:   [address] [place] [address] [phone]        The results of significant diagnostics from this hospitalization (including imaging, microbiology, ancillary and laboratory) are listed below for reference.    Significant Diagnostic Studies: Ct Chest W Contrast  09/06/2014   CLINICAL DATA:  Newly diagnosed pancreatic carcinoma.  EXAM: CT CHEST WITH CONTRAST  TECHNIQUE: Multidetector CT imaging of the chest was performed during intravenous contrast administration.  CONTRAST:  145mL OMNIPAQUE IOHEXOL 300 MG/ML  SOLN  COMPARISON:  Recent abdomen CT on 08/30/2014  FINDINGS: Mediastinum/Hilar Regions: No masses or pathologically enlarged lymph nodes identified.  Other Thoracic Lymphadenopathy:  None.  Lungs:  No pulmonary infiltrate or mass identified.  Pleura:  No evidence of effusion or mass.  Vascular/Cardiac:  No acute findings identified.  Other:  None.  Musculoskeletal:  No suspicious bone lesions identified.  IMPRESSION: No evidence of thoracic metastatic disease or other acute findings.   Electronically Signed   By: Earle Gell M.D.   On: 09/06/2014 16:33   Ct Abdomen Pelvis W Contrast  08/30/2014   CLINICAL DATA:  Chronic pancreatitis. Nausea vomiting. Followup exam.  EXAM: CT ABDOMEN AND PELVIS WITH CONTRAST  TECHNIQUE: Multidetector CT imaging of the abdomen and pelvis was performed using the standard protocol following bolus administration of intravenous contrast.  CONTRAST:  166mL OMNIPAQUE IOHEXOL 300 MG/ML  SOLN  COMPARISON:  CT 07/31/2014.  FINDINGS: No focal hepatic abnormality. Spleen normal. Ill-defined complex pancreatic head mass is again noted. The mass measures 4.9 by 3.5 cm. Central lucency is present suggesting necrosis. This finding is highly suspicious with pancreatic cancer. Adjacent nodular PA pancreatic densities consistent peripancreatic lymph nodes are again noted and unchanged. These measure up to approximately 1  cm. 1 cm retroperitoneal lymph nodes are again noted. No associated biliary distention. Gallbladder is nondistended. Dorsal torsion of the fat planes about the mesenteric vasculature noted with possible narrowing of the SMA. Portal vein is patent.  Adrenals are normal. Kidneys are normal. No hydronephrosis or obstructing ureteral stone. The bladder is nondistended. Prostate is slightly enlarged.  No significant inguinal adenopathy. As noted above 1 cm retroperitoneal adenopathy is present along with peripancreatic adenopathy.  Appendix normal. No bowel distention. No free air. No gastric distention. No significant abdominal wall hernia.  Mild basilar atelectasis. Heart size normal. No acute bony abnormality. Diffuse degenerative changes lumbar spine, thickening prominent at L5-S1.  IMPRESSION: 1. 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 Stable adjacent peripancreatic and retroperitoneal lymph nodes. SMA involvement cannot be excluded. No evidence of biliary obstruction. Similar findings noted on prior CT of 07/31/2014. 2. No acute intra-abdominal abnormality identified.   Electronically Signed   By: Marcello Moores  Register   On: 08/30/2014 09:45    Microbiology: Recent Results (from the past 240 hour(s))  BODY FLUID CULTURE     Status: None   Collection Time    09/04/14  1:12 PM      Result Value Ref Range Status   Specimen Description FLUID PANCREATIC CYST   Final   Special Requests NONE   Final   Gram Stain     Final   Value: ABUNDANT WBC PRESENT,BOTH PMN AND MONONUCLEAR     NO ORGANISMS SEEN     Performed at [facility]   Culture     Final   Value: NO GROWTH 2 DAYS     Performed at [facility]   Report Status PENDING   Incomplete     Labs: Basic Metabolic Panel:  Recent Labs Lab 09/02/14 0940 09/03/14 0820 09/04/14 0507 09/05/14 0500  NA 137 140 143 141  K 3.5* 3.6* 4.5 4.2  CL 101 100 102 100  CO2 24 27 26 28   GLUCOSE  104* 88 87 98  BUN 16 9 6 7   CREATININE 0.82 0.79 0.80 0.86  CALCIUM 8.8 9.1 9.5 9.7  MG  --   --  2.1  --    Liver Function Tests:  Recent  Labs Lab 09/02/14 0940 09/03/14 0820 09/04/14 0507 09/05/14 0500  AST 19 21 24 27   ALT 20 24 29  38  ALKPHOS 45 48 50 54  BILITOT 0.8 0.7 0.8 0.9  PROT 6.7 6.9 7.1 7.2  ALBUMIN 4.0 4.0 4.1 4.2    Recent Labs Lab 09/02/14 0940  LIPASE 102*   No results found for this basename: AMMONIA,  in the last 168 hours CBC:  Recent Labs Lab 09/04/14 0507 09/05/14 0500  WBC 7.0 8.4  HGB 13.8 14.1  HCT 38.4* 38.5*  MCV 90.1 87.7  PLT 264 265   Cardiac Enzymes: No results found for this basename: CKTOTAL, CKMB, CKMBINDEX, TROPONINI,  in the last 168 hours BNP: BNP (last 3 results) No results found for this basename: PROBNP,  in the last 8760 hours CBG: No results found for this basename: GLUCAP,  in the last 168 hours     Signed:  Kinnie Feil  [facility] 09/08/2014, 8:56 AM

## 2014-09-08 NOTE — Discharge Instructions (Addendum)
Please follow up with Dr. Ammie Dalton (or Dr. Marjie Skiff) oncology next week Please follow up with surgery Dr. Barry Dienes in 2 weeks

## 2014-09-11 ENCOUNTER — Telehealth: Payer: Self-pay | Admitting: *Deleted

## 2014-09-11 ENCOUNTER — Telehealth: Payer: Self-pay | Admitting: Oncology

## 2014-09-11 NOTE — Telephone Encounter (Signed)
S/W PT IN REF TO NP APPT. ON 09/17/14@1 :30 DX-PANCREATIC CA

## 2014-09-11 NOTE — Telephone Encounter (Signed)
Following up on his discharge instructions to see oncologist asap for new diagnosis of pancreatic cancer to start chemo and radiation. Forwarded message to [employer] in [place].

## 2014-09-12 ENCOUNTER — Telehealth: Payer: Self-pay | Admitting: Oncology

## 2014-09-12 NOTE — Telephone Encounter (Signed)
Delivered chart 09/12/14

## 2014-09-13 ENCOUNTER — Telehealth: Payer: Self-pay | Admitting: *Deleted

## 2014-09-13 NOTE — Telephone Encounter (Signed)
Left VM requesting refill on his Hydromorphone 4 mg tablets that were written for him on 09/08/14 # 50 tabs by a Dr. Lollie Marrow (IM). Dr. Benay Spice has not seen this patient yet. His new patient appointment is on 09/17/14. Has been seen by Dr. Barry Dienes in hospital.

## 2014-09-13 NOTE — Telephone Encounter (Signed)
Called back and left VM that Dr. Benay Spice can't write prescriptions before he sees him. Suggested he contact his surgeon or GI physician to provide few days supply until he can be seen on 10/26.

## 2014-09-17 ENCOUNTER — Ambulatory Visit (HOSPITAL_BASED_OUTPATIENT_CLINIC_OR_DEPARTMENT_OTHER): Payer: BC Managed Care – PPO

## 2014-09-17 ENCOUNTER — Encounter: Payer: Self-pay | Admitting: Oncology

## 2014-09-17 ENCOUNTER — Encounter: Payer: Self-pay | Admitting: *Deleted

## 2014-09-17 ENCOUNTER — Ambulatory Visit (HOSPITAL_BASED_OUTPATIENT_CLINIC_OR_DEPARTMENT_OTHER): Payer: BC Managed Care – PPO | Admitting: Oncology

## 2014-09-17 ENCOUNTER — Telehealth: Payer: Self-pay | Admitting: Oncology

## 2014-09-17 VITALS — BP 128/82 | HR 84 | Temp 98.2°F | Resp 20 | Ht 69.0 in | Wt 178.2 lb

## 2014-09-17 DIAGNOSIS — C250 Malignant neoplasm of head of pancreas: Secondary | ICD-10-CM

## 2014-09-17 MED ORDER — MORPHINE SULFATE 15 MG PO TABS: 15.0000 mg | ORAL_TABLET | ORAL | Status: DC | PRN

## 2014-09-17 MED ORDER — FENTANYL 100 MCG/HR TD PT72: 100.0000 ug | MEDICATED_PATCH | TRANSDERMAL | Status: DC

## 2014-09-17 NOTE — Progress Notes (Signed)
GI Location of Tumor / Histology: Pancreas  Richard Cantu presented  months ago with symptoms of: worsening abdominal pain over 1 month,acute recurrent pancreatitis  Biopsies of  (if applicable) revealed: Diagnosis 09/04/14: PANCREATIC CYST, FINE NEEDLE ASPIRATION (SPECIMEN 3 OF 3, COLLECTED ON 09/05/14): MALIGNANT CELLS AND NECROSIS, CONSISTENT WITH ADENOCARCINOMA  Past/Anticipated interventions by surgeon, if any:Dr. Arta Silence endoscopic ultrasound 08/03/14, pancreatic mass head of pancreas =pseudocyst, 09/04/14 Esophageal endoscopic U/s with fine needle aspiratin  per Dr. Aura Dials note from 09/08/14  To f/u in 2 weeks with surgery with Dr. Stark Klein 09/24/14  Past/Anticipated interventions by medical oncology, if ZRA:QTMA with Dr. Benay Spice 09/17/14, Gi tumor conference scheduled 09/19/14, follow up 09/21/14,   Weight changes, if any:10 lb wt.loss over the last month and a half; patient reports his ideal weight to be 193-195  Bowel/Bladder complaints, if any: reports constipation x2 day despite taking miralax, reports urinary hesitancy and frequency; denies hematuria or dysuria. Denies blood in stool.  Nausea / Vomiting, if any: intermittent for which he takes zofran  Pain issues, if UQJ:[id number] pancreatic pain, on duragesic increased to 175mcg    wll try MSIR (15-30mg ) for breakthrough pain   SAFETY ISSUES:no   Prior radiation? No  Pacemaker/ICD? No  Is the patient on methotrexate?  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

## 2014-09-17 NOTE — Progress Notes (Signed)
[facility] Patient Consult   Referring MD: Alexus Michael Schaffer 58 y.o.  05-04-1956    Reason for Referral: Pancreas cancer   HPI: He reports a 2 month history of progressive abdominal pain. He also has pain in the lower back. He saw Dr. Alroy Dust. An abdominal ultrasound 07/04/2014. No cholelithiasis or evidence of acute cholecystitis. The pancreas was suboptimally visualized.  He was referred to Dr. Michail Sermon. A CT of the abdomen 07/31/2014 revealed a mass in the inferior head and uncinate  of the pancreas. The mass was intimately associated with the third portion of the duodenum. The mass was also associated with the superior mesenteric artery which was felt to be surrounded by the lesion by over 50%. The mass appeared separate from the superior mesenteric vein and portal vein. Multiple adjacent lymph nodes were borderline enlarged. The liver and bilateral adrenal glands appeared unremarkable. No ascites.  He was referred to Dr. Paulita Fujita and was taken to endoscopic ultrasound procedure on 11 2015. There was extensive lobularity in the body and tail of the pancreas with scattered hyperechoic strains/foci consistent with resolving pancreatitis versus chronic pancreatitis. 8 3 x 3 cm cystic region was noted at the interface of the head and uncinate that was not well-demarcated. A few benign-appearing peripancreatic lymph nodes were seen. The appearance on ultrasound was felt to be consistent with chronic pancreatitis. He was placed on a trial of pancreatic enzyme.  The pain progressed and he developed nausea. He was admitted 08/30/2014. A repeat CT of the abdomen 08/30/2014 revealed a stable mass in the pancreatic head with adjacent peripancreatic and retroperitoneal lymph nodes. Possible narrowing of the superior mesenteric artery.  Dr. Paulita Fujita was consult at and he was taken to a repeat endoscopic ultrasound 09/04/2014. A cystic lesion was again seen at the  interface of the head and uncinate without a solid component. A well-defined 1 cm peripancreatic lymph node was FNA biopsy. The cystic region was aspirated. A second pass of the cystic lesion returned bloody. The cytology from the pancreatic cyst ([id number] (revealed malignant cells and necrosis consistent with adenocarcinoma. The FNA from the lymph node revealed small lymphocytes and no malignant cells.  He was placed on Duragesic and Dilaudid for pain. He reports minimal pain relief with 4 mg of Dilaudid. The addition of ibuprofen helps. He is in pain constantly. His bowels are moving.  He is scheduled to see Dr. Lisbeth Renshaw on 09/19/2014 and Dr. Barry Dienes 09/24/2014.    Past Medical History  Diagnosis Date  . Hemorrhoids   .    Marland Kitchen Pancreatic cyst dx'd 08/03/2014  . History of gout   . Anxiety   . 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 HTN (hypertension)     .    Psoriasis    .    History of "skin cancer "  Past Surgical History  Procedure Laterality Date  . Colonoscopy    . Eus N/A 08/03/2014    Procedure: ESOPHAGEAL ENDOSCOPIC ULTRASOUND (EUS) RADIAL;  Surgeon: Arta Silence, MD;  Location: [facility];  Service: Endoscopy;  Laterality: N/A;  H&P in file  . Eus N/A 09/04/2014    Procedure: ESOPHAGEAL ENDOSCOPIC ULTRASOUND (EUS) RADIAL;  Surgeon: Arta Silence, MD;  Location: [facility];  Service: Endoscopy;  Laterality: N/A;  . Fine needle aspiration N/A 09/04/2014    Procedure: FINE NEEDLE ASPIRATION (FNA) RADIAL;  Surgeon: Arta Silence, MD;  Location: [facility];  Service: Endoscopy;  Laterality: N/A;  Medications: Reviewed  Allergies: No Known Allergies  Family history: No family history of cancer.  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 He quit smoking cigarettes 3 years ago. He quit drinking alcohol in September 2015. No transfusion history. No risk factor for HIV or hepatitis.  History  Alcohol Use  . Yes     Comment: 08/31/2014 "stopped drinking 07/24/2014"    History  Smoking status  . Former Smoker -- 0.50 packs/day for 30 years  . Types: Cigarettes  Smokeless tobacco  . Never Used    Comment: "quit smoking  <2014"      ROS:   Positives include: 20-30 pound weight loss, numbness at the medial aspect of the left hand for several months, abdomen and back pain-not relieved with Dilaudid  A complete ROS was otherwise negative.  Physical Exam:  Blood pressure 128/82, pulse 84, temperature 98.2 F (36.8 C), temperature source Oral, resp. rate 20, height 5\' 9"  (1.753 m), weight 178 lb 3.2 oz (80.831 kg).  HEENT: Oropharynx without visible mass, neck without mass Lungs: Clear bilaterally Cardiac: Regular rate and rhythm Abdomen: No hepatosplenomegaly, tender in the mid upper abdomen, no mass GU: Testes without mass  Vascular: No leg edema Lymph nodes: No cervical, supraclavicular, or inguinal nodes.? "Shotty "bilateral axillary nodes Neurologic: Alert and oriented, the motor exam appears intact in the upper and lower extremities Skin: No rash, benign appearing moles over the trunk, psoriatic changes at the first finger and thumb nails  bilaterally Musculoskeletal: No spine tenderness   LAB:  CBC  Lab Results  Component Value Date   WBC 8.4 09/05/2014   HGB 14.1 09/05/2014   HCT 38.5* 09/05/2014   MCV 87.7 09/05/2014   PLT 265 09/05/2014   NEUTROABS 5.8 08/30/2014     CMP      Component Value Date/Time   NA 141 09/05/2014 0500   K 4.2 09/05/2014 0500   CL 100 09/05/2014 0500   CO2 28 09/05/2014 0500   GLUCOSE 98 09/05/2014 0500   BUN 7 09/05/2014 0500   CREATININE 0.86 09/05/2014 0500   CALCIUM 9.7 09/05/2014 0500   PROT 7.2 09/05/2014 0500   ALBUMIN 4.2 09/05/2014 0500   AST 27 09/05/2014 0500   ALT 38 09/05/2014 0500   ALKPHOS 54 09/05/2014 0500   BILITOT 0.9 09/05/2014 0500   GFRNONAA >90 09/05/2014 0500   GFRAA >90 09/05/2014 0500   CA 19-9 on  08/31/2014-169.1  Imaging:  As per history of present illness, CTs from September and October of 2015 reviewed   Assessment/Plan:  - 1. Adenocarcinoma the pancreas, no chronic pancreas head/uncinate mass, status post an FNA biopsy 09/04/2014 confirming adenocarcinoma Staging CT scans 07/31/2014 and 08/30/2014 concerning for involvement of the superior mesenteric artery   2. Pain secondary to #1  3.   psoriasis   Disposition:   Mr. Turano has been diagnosed with adenocarcinoma the pancreas. I discussed treatment options with him. He understands the only potentially curative therapy is surgery. His case will be presented at the GI tumor conference 09/19/2014 for an initial determination regarding resectability. He is scheduled to see Dr. Barry Dienes 09/24/2014.  If the tumor is felt to be "borderline "resectable then treatment options will include neoadjuvant chemotherapy/radiation and neoadjuvant chemotherapy. We discussed capecitabine/radiation and FOLFIRINOX.  He will return 09/21/2014 to make a treatment plan.  The pain is not relieved with the current narcotic regimen. We increased the Duragesic patch to 100  mcg and he will try MSIR (15-30 mg) for breakthrough pain.  I cautioned him against driving while taking narcotics. I recommended he take time away from work and check into getting medical disability. We will ask the [place] worker to contact him.  [place], [place] 09/17/2014, 2:20 PM

## 2014-09-17 NOTE — Telephone Encounter (Signed)
Gave avs & cal for Oct.

## 2014-09-17 NOTE — Progress Notes (Signed)
Checked in new patient with no financial issues prior to seeing the dr. I gave him appt card and Lenise's card if asst is needed. He has not been out of the country.

## 2014-09-18 ENCOUNTER — Telehealth: Payer: Self-pay | Admitting: *Deleted

## 2014-09-18 ENCOUNTER — Encounter: Payer: Self-pay | Admitting: *Deleted

## 2014-09-18 NOTE — Progress Notes (Signed)
[place] Work   Clinical Social Work was referred by Futures trader for information on applying for [place]. Clinical Social Worker contacted patient at home to offer support and assess for needs. CSW and patient discussed SSD requirements and the application process. CSW provided patient with contact information to the local Social Security Office and encouraged him to call and schedule an appointment to initiate the application process. Patient plans to call and schedule his appointment today. CSW encouraged patient to call when he received his appointment date and CSW can provided additional support as needed.   Clinical Social Work interventions:  Resource education   Johnnye Lana, MSW, [employer], [facility]  Clinical Social Worker  [place]  [phone]

## 2014-09-18 NOTE — Telephone Encounter (Signed)
Spoke with pt, he discussed disability requirements with Education officer, museum today. Denied any other questions/ issues.

## 2014-09-19 ENCOUNTER — Ambulatory Visit
Admission: RE | Admit: 2014-09-19 | Discharge: 2014-09-19 | Disposition: A | Discharge status: Home / Self Care | Payer: BC Managed Care – PPO | Source: Ambulatory Visit | Attending: Radiation Oncology | Admitting: Radiation Oncology

## 2014-09-19 ENCOUNTER — Other Ambulatory Visit (INDEPENDENT_AMBULATORY_CARE_PROVIDER_SITE_OTHER): Payer: Self-pay | Admitting: General Surgery

## 2014-09-19 DIAGNOSIS — C259 Malignant neoplasm of pancreas, unspecified: Secondary | ICD-10-CM

## 2014-09-19 DIAGNOSIS — Z510 Encounter for antineoplastic radiation therapy: Secondary | ICD-10-CM | POA: Insufficient documentation

## 2014-09-19 DIAGNOSIS — C250 Malignant neoplasm of head of pancreas: Secondary | ICD-10-CM

## 2014-09-19 NOTE — Progress Notes (Signed)
Radiation Oncology         ([phone]) [phone] ________________________________  Name: [name] MRN: [id number]  Date: 09/19/2014  DOB: 1956-04-30  QI:[id number],JHER, MD  Ladell Pier, MD   Stark Klein, MD  REFERRING PHYSICIAN: Ladell Pier, MD   DIAGNOSIS: The encounter diagnosis was Cancer of head of pancreas.   HISTORY OF PRESENT ILLNESS::Richard Cantu is a 58 y.o. male who is seen for an initial consultation visit regarding the patient's diagnosis of pancreatic cancer.  The patient presented with progressive abdominal pain.  The patient had an abdominal ultrasound which did not show any significant abnormalities within the pancreas at that time. The patient subsequently proceeded to undergo a CT scan of the abdomen. A mass was present within the pancreas which was associated with the duodenum. It was also associated with the superior mesenteric artery and was felt to be surrounded by the lesion by over 50%. Multiple adjacent lymph nodes were borderline enlarged in this area.  The patient was taken for an endoscopic ultrasound. The findings were felt to be consistent with chronic pancreatitis and he was placed on a trial of pancreatic enzyme replacement. However the patient's pain persisted and progressed and he also developed some nausea. A repeat CT scan of the abdomen revealed a stable mass in the head of the pancreas, again with associated nearby lymph nodes. The patient was taken for a repeat endoscopic ultrasound and the lesion was once again seen which was a cystic mass. A fine-needle aspiration was performed of the mass and this returned positive for adenocarcinoma. A fine-needle aspiration of an associated lymph node did not reveal malignant cells.  The patient had his case discussed in multidisciplinary GI conference today. He states that he is doing better overall. He denies any nausea currently. He is taking Duragesic with immediate release morphine and he states that  his pain is under much better control at this time.    PREVIOUS RADIATION THERAPY: No   PAST MEDICAL HISTORY:  has a past medical history of Hemorrhoids; Chronic pancreatitis; Pancreatic cyst (dx'd 08/03/2014); History of gout; Anxiety; Pancreatic cancer (09/04/14); and HTN (hypertension).     PAST SURGICAL HISTORY: Past Surgical History  Procedure Laterality Date  . Colonoscopy    . Eus N/A 08/03/2014    Procedure: ESOPHAGEAL ENDOSCOPIC ULTRASOUND (EUS) RADIAL;  Surgeon: Arta Silence, MD;  Location: [facility];  Service: Endoscopy;  Laterality: N/A;  H&P in file  . Eus N/A 09/04/2014    Procedure: ESOPHAGEAL ENDOSCOPIC ULTRASOUND (EUS) RADIAL;  Surgeon: Arta Silence, MD;  Location: [facility];  Service: Endoscopy;  Laterality: N/A;  . Fine needle aspiration N/A 09/04/2014    Procedure: FINE NEEDLE ASPIRATION (FNA) RADIAL;  Surgeon: Arta Silence, MD;  Location: [facility];  Service: Endoscopy;  Laterality: N/A;     FAMILY HISTORY: family history is not on file.   SOCIAL HISTORY:  reports that he has quit smoking. His smoking use included Cigarettes. He has a 15 pack-year smoking history. He has never used smokeless tobacco. He reports that he drinks alcohol. He reports that he does not use illicit drugs.   ALLERGIES: Review of patient's allergies indicates no known allergies.   MEDICATIONS:  Current Outpatient Prescriptions  Medication Sig Dispense Refill  . ALPRAZolam (XANAX) 1 MG tablet Take 1 tablet (1 mg total) by mouth at bedtime as needed for anxiety.  30 tablet  0  . fentaNYL (DURAGESIC - DOSED MCG/HR) 100 MCG/HR Place 1 patch (100 mcg total)  onto the skin every 3 (three) days.  5 patch  0  . ibuprofen (ADVIL,MOTRIN) 200 MG tablet Take 800 mg by mouth every 8 (eight) hours as needed for moderate pain.      Marland Kitchen lactose free nutrition (BOOST) LIQD Take 237 mLs by mouth 4 (four) times daily.      . lipase/protease/amylase (CREON) 12000 UNITS CPEP capsule Take 36,000  Units by mouth 3 (three) times daily with meals. Has sample bottle Lipase 36,000- Protease 114,000- Amylase 180,000      . morphine (MSIR) 15 MG tablet Take 1-2 tablets (15-30 mg total) by mouth every 4 (four) hours as needed for severe pain.  60 tablet  0  . pantoprazole (PROTONIX) 40 MG tablet Take 40 mg by mouth daily.      . polyethylene glycol (MIRALAX / GLYCOLAX) packet Take 17 g by mouth daily.  14 each  0  . ondansetron (ZOFRAN) 8 MG tablet Take 1 tablet (8 mg total) by mouth every 8 (eight) hours as needed for nausea or vomiting.  20 tablet  0  . Probiotic Product (PROBIOTIC DAILY PO) Take 1 tablet by mouth daily.      Marland Kitchen senna-docusate (SENOKOT-S) 8.6-50 MG per tablet Take 1 tablet by mouth 2 (two) times daily.       No current facility-administered medications for this encounter.     REVIEW OF SYSTEMS:  A 15 point review of systems is documented in the electronic medical record. This was obtained by the nursing staff. However, I reviewed this with the patient to discuss relevant findings and make appropriate changes.  Pertinent items are noted in HPI.    PHYSICAL EXAM:  vitals were not taken for this visit.  ECOG = 1  0 - Asymptomatic (Fully active, able to carry on all predisease activities without restriction)  1 - Symptomatic but completely ambulatory (Restricted in physically strenuous activity but ambulatory and able to carry out work of a light or sedentary nature. For example, light housework, office work)  2 - Symptomatic, <50% in bed during the day (Ambulatory and capable of all self care but unable to carry out any work activities. Up and about more than 50% of waking hours)  3 - Symptomatic, >50% in bed, but not bedbound (Capable of only limited self-care, confined to bed or chair 50% or more of waking hours)  4 - Bedbound (Completely disabled. Cannot carry on any self-care. Totally confined to bed or chair)  5 - Death   Eustace Pen MM, Creech RH, Tormey DC, et al. [phone]).  "Toxicity and response criteria of the [facility]". [place] Oncol. 5 (6): 649-55  General: Well-developed, in no acute distress HEENT: Normocephalic, atraumatic; oral cavity clear Neck: Supple without any lymphadenopathy Cardiovascular: Regular rate and rhythm Respiratory: Clear to auscultation bilaterally GI: Soft, nontender, normal bowel sounds Extremities: No edema present Neuro: No focal deficits     LABORATORY DATA:  Lab Results  Component Value Date   WBC 8.4 09/05/2014   HGB 14.1 09/05/2014   HCT 38.5* 09/05/2014   MCV 87.7 09/05/2014   PLT 265 09/05/2014   Lab Results  Component Value Date   NA 141 09/05/2014   K 4.2 09/05/2014   CL 100 09/05/2014   CO2 28 09/05/2014   Lab Results  Component Value Date   ALT 38 09/05/2014   AST 27 09/05/2014   ALKPHOS 54 09/05/2014   BILITOT 0.9 09/05/2014      RADIOGRAPHY: Ct Chest  W Contrast  09/06/2014   CLINICAL DATA:  Newly diagnosed pancreatic carcinoma.  EXAM: CT CHEST WITH CONTRAST  TECHNIQUE: Multidetector CT imaging of the chest was performed during intravenous contrast administration.  CONTRAST:  137mL OMNIPAQUE IOHEXOL 300 MG/ML  SOLN  COMPARISON:  Recent abdomen CT on 08/30/2014  FINDINGS: Mediastinum/Hilar Regions: No masses or pathologically enlarged lymph nodes identified.  Other Thoracic Lymphadenopathy:  None.  Lungs:  No pulmonary infiltrate or mass identified.  Pleura:  No evidence of effusion or mass.  Vascular/Cardiac:  No acute findings identified.  Other:  None.  Musculoskeletal:  No suspicious bone lesions identified.  IMPRESSION: No evidence of thoracic metastatic disease or other acute findings.   Electronically Signed   By: Earle Gell M.D.   On: 09/06/2014 16:33   Ct Abdomen Pelvis W Contrast  08/30/2014   CLINICAL DATA:  Chronic pancreatitis. Nausea vomiting. Followup exam.  EXAM: CT ABDOMEN AND PELVIS WITH CONTRAST  TECHNIQUE: Multidetector CT imaging of the abdomen and  pelvis was performed using the standard protocol following bolus administration of intravenous contrast.  CONTRAST:  165mL OMNIPAQUE IOHEXOL 300 MG/ML  SOLN  COMPARISON:  CT 07/31/2014.  FINDINGS: No focal hepatic abnormality. Spleen normal. Ill-defined complex pancreatic head mass is again noted. The mass measures 4.9 by 3.5 cm. Central lucency is present suggesting necrosis. This finding is highly suspicious with pancreatic cancer. Adjacent nodular PA pancreatic densities consistent peripancreatic lymph nodes are again noted and unchanged. These measure up to approximately 1 cm. 1 cm retroperitoneal lymph nodes are again noted. No associated biliary distention. Gallbladder is nondistended. Dorsal torsion of the fat planes about the mesenteric vasculature noted with possible narrowing of the SMA. Portal vein is patent.  Adrenals are normal. Kidneys are normal. No hydronephrosis or obstructing ureteral stone. The bladder is nondistended. Prostate is slightly enlarged.  No significant inguinal adenopathy. As noted above 1 cm retroperitoneal adenopathy is present along with peripancreatic adenopathy.  Appendix normal. No bowel distention. No free air. No gastric distention. No significant abdominal wall hernia.  Mild basilar atelectasis. Heart size normal. No acute bony abnormality. Diffuse degenerative changes lumbar spine, thickening prominent at L5-S1.  IMPRESSION: 1. 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 Stable adjacent peripancreatic and retroperitoneal lymph nodes. SMA involvement cannot be excluded. No evidence of biliary obstruction. Similar findings noted on prior CT of 07/31/2014. 2. No acute intra-abdominal abnormality identified.   Electronically Signed   By: Marcello Moores  Register   On: 08/30/2014 09:45       IMPRESSION:  Cancer of head of pancreas   Primary site: Pancreas   Staging method: AJCC 7th Edition   Clinical: Stage III (T4, N0, M0) signed by  Marye Round, MD on 09/19/2014 11:05 AM   Summary: Stage III (T4, N0, M0)   The patient has a new diagnosis of adenocarcinoma of the pancreas. The patient's case was reviewed today in multidisciplinary GI conference. Currently, the patient's tumor is unresectable. This may potentially be able to be converted to resectability after initial treatment. The patient understands the importance of this issue and the impact of surgery on prognosis.  The recommendation from conference was to proceed initially with chemotherapy alone. This would have the advantage of potentially shrinking the tumor and also treating potential micrometastatic disease. The tumor wasn't then be reassessed. If the patient appears resectable at that time, then surgery would be reasonable. If not and the patient does not have evidence of metastatic disease,  then further treatment including radiation treatment may be reasonable. In the absence of metastatic disease, conventional chemoradiation treatment is an option. In a young patient such as this, stereotactic body radiation treatment would also be a consideration if the patient is not a good surgical candidate.  Additionally, the patient has had some abdominal pain although this is improved with his current regimen. Radiation treatment for palliation may also be reasonable at some point. We also discussed a possible celiac block today and conference.   PLAN: I will follow the patient's case and will be happy to see patient back at the appropriate time, likely after initial chemotherapy and reassessment with further imaging.       ________________________________   Jodelle Gross, MD, PhD   **Disclaimer: This note was dictated with voice recognition software. Similar sounding words can inadvertently be transcribed and this note may contain transcription errors which may not have been corrected upon publication of note.**

## 2014-09-19 NOTE — Addendum Note (Signed)
Encounter addended by: Heywood Footman, RN on: 09/19/2014  2:06 PM<BR>     Documentation filed: Chief Complaint Section, Flowsheet VN, Charges VN, Inpatient Document Flowsheet, BPA Follow-up Actions, Inpatient Patient Education, Orders

## 2014-09-19 NOTE — Progress Notes (Signed)
See progress note under physician encounter. 

## 2014-09-20 ENCOUNTER — Encounter (HOSPITAL_BASED_OUTPATIENT_CLINIC_OR_DEPARTMENT_OTHER): Payer: Self-pay | Admitting: *Deleted

## 2014-09-20 NOTE — Progress Notes (Signed)
No new labs needed-not taking some of his meds, says he is feeling better

## 2014-09-21 ENCOUNTER — Other Ambulatory Visit: Payer: Self-pay | Admitting: *Deleted

## 2014-09-21 ENCOUNTER — Encounter: Payer: Self-pay | Admitting: *Deleted

## 2014-09-21 ENCOUNTER — Ambulatory Visit (HOSPITAL_BASED_OUTPATIENT_CLINIC_OR_DEPARTMENT_OTHER): Payer: BC Managed Care – PPO | Admitting: Oncology

## 2014-09-21 ENCOUNTER — Telehealth: Payer: Self-pay | Admitting: Oncology

## 2014-09-21 ENCOUNTER — Other Ambulatory Visit: Payer: BC Managed Care – PPO

## 2014-09-21 VITALS — BP 90/67 | HR 99 | Resp 18 | Ht 69.0 in | Wt 179.0 lb

## 2014-09-21 DIAGNOSIS — G893 Neoplasm related pain (acute) (chronic): Secondary | ICD-10-CM

## 2014-09-21 DIAGNOSIS — C250 Malignant neoplasm of head of pancreas: Secondary | ICD-10-CM

## 2014-09-21 MED ORDER — PROCHLORPERAZINE MALEATE 10 MG PO TABS: 10.0000 mg | ORAL_TABLET | Freq: Four times a day (QID) | ORAL | Status: DC | PRN

## 2014-09-21 MED ORDER — MORPHINE SULFATE 15 MG PO TABS: 15.0000 mg | ORAL_TABLET | ORAL | Status: DC | PRN

## 2014-09-21 MED ORDER — LIDOCAINE-PRILOCAINE 2.5-2.5 % EX CREA: 1.0000 "application " | TOPICAL_CREAM | CUTANEOUS | Status: DC | PRN

## 2014-09-21 NOTE — Progress Notes (Signed)
  [facility] OFFICE PROGRESS NOTE   Diagnosis: Pancreas cancer  INTERVAL HISTORY:   He returns as scheduled. He reports partial improvement in the abdominal pain with the increased dose of Duragesic and MSIR. No other complaint.  Objective:  Vital signs in last 24 hours:  Blood pressure 90/67, pulse 99, resp. rate 18, height 5\' 9"  (1.753 m), weight 179 lb (81.194 kg).  Resp: Lungs clear bilaterally Cardio: Regular rate and rhythm GI: No hepatomegaly, tender in the mid upper abdomen Vascular: No leg edema   Medications: I have reviewed the patient's current medications.  Assessment/Plan: 1.Adenocarcinoma the pancreas, no chronic pancreas head/uncinate mass, status post an FNA biopsy 09/04/2014 confirming adenocarcinoma.Staging  -CT scans 07/31/2014 and 08/30/2014 concerning for involvement of the superior mesenteric artery  2. Pain secondary to #1        3. psoriasis    Disposition:  The pain is partially improved with the current narcotic regimen. He will continue Duragesic and MSIR. We presented his case at the GI tumor conference 09/19/2014. The tumor appears unresectable at present secondary to apparent involvement of the superior mesenteric artery. The consensus recommendation is to proceed with FOLFIRINOX.  He is scheduled to see Dr. Barry Dienes for surgical consultation and placement of a Port-A-Cath.  He saw Dr. Lisbeth Renshaw earlier today. We will consider concurrent capecitabine and radiation based on his response to the FOLFIRINOX  I reviewed the potential toxicities associated with the FOLFIRINOX regimen including the chance for nausea/vomiting, alopecia, mucositis, diarrhea, and hematologic toxicity. We discussed the potential for an allergic reaction. We reviewed the hand/foot syndrome and hyperpigmentation associated with 5-fluorouracil. We discussed the various types of neuropathy seen with oxaliplatin and the diarrhea/abdominal pain associated with  irinotecan. He agrees to proceed. Mr. Danford will attend a chemotherapy teaching class today.  The plan is to begin a first cycle of FOLFIRINOX on 09/26/2014. He will return for an office visit and chemotherapy 10/10/2014.  Betsy Coder, MD  09/21/2014  11:40 AM

## 2014-09-21 NOTE — Telephone Encounter (Signed)
s/w pt re next appt for 11/4 and pt to get new schedule when she comes in.

## 2014-09-23 ENCOUNTER — Other Ambulatory Visit: Payer: Self-pay | Admitting: Oncology

## 2014-09-25 ENCOUNTER — Encounter (HOSPITAL_BASED_OUTPATIENT_CLINIC_OR_DEPARTMENT_OTHER)
Admission: RE | Disposition: A | Discharge status: Home / Self Care | Payer: Self-pay | Source: Ambulatory Visit | Attending: General Surgery

## 2014-09-25 ENCOUNTER — Ambulatory Visit (HOSPITAL_BASED_OUTPATIENT_CLINIC_OR_DEPARTMENT_OTHER): Payer: BC Managed Care – PPO | Admitting: Anesthesiology

## 2014-09-25 ENCOUNTER — Ambulatory Visit (HOSPITAL_COMMUNITY): Payer: BC Managed Care – PPO

## 2014-09-25 ENCOUNTER — Ambulatory Visit (HOSPITAL_BASED_OUTPATIENT_CLINIC_OR_DEPARTMENT_OTHER)
Admission: RE | Admit: 2014-09-25 | Discharge: 2014-09-25 | Disposition: A | Discharge status: Home / Self Care | Payer: BC Managed Care – PPO | Source: Ambulatory Visit | Attending: General Surgery | Admitting: General Surgery

## 2014-09-25 ENCOUNTER — Encounter (HOSPITAL_BASED_OUTPATIENT_CLINIC_OR_DEPARTMENT_OTHER): Payer: Self-pay | Admitting: *Deleted

## 2014-09-25 DIAGNOSIS — I10 Essential (primary) hypertension: Secondary | ICD-10-CM | POA: Diagnosis not present

## 2014-09-25 DIAGNOSIS — M109 Gout, unspecified: Secondary | ICD-10-CM | POA: Diagnosis not present

## 2014-09-25 DIAGNOSIS — Z79899 Other long term (current) drug therapy: Secondary | ICD-10-CM | POA: Insufficient documentation

## 2014-09-25 DIAGNOSIS — Z95828 Presence of other vascular implants and grafts: Secondary | ICD-10-CM

## 2014-09-25 DIAGNOSIS — F419 Anxiety disorder, unspecified: Secondary | ICD-10-CM | POA: Diagnosis not present

## 2014-09-25 DIAGNOSIS — K219 Gastro-esophageal reflux disease without esophagitis: Secondary | ICD-10-CM | POA: Diagnosis not present

## 2014-09-25 DIAGNOSIS — C259 Malignant neoplasm of pancreas, unspecified: Secondary | ICD-10-CM | POA: Insufficient documentation

## 2014-09-25 HISTORY — DX: Presence of spectacles and contact lenses: Z97.3

## 2014-09-25 HISTORY — PX: PORTACATH PLACEMENT: SHX2246

## 2014-09-25 HISTORY — DX: Gastro-esophageal reflux disease without esophagitis: K21.9

## 2014-09-25 SURGERY — INSERTION, TUNNELED CENTRAL VENOUS DEVICE, WITH PORT
Anesthesia: General | Site: Chest | Laterality: Left

## 2014-09-25 MED ORDER — HEPARIN (PORCINE) IN NACL 2-0.9 UNIT/ML-% IJ SOLN
INTRAMUSCULAR | Status: DC | PRN
  Administered 2014-09-25: 1 via INTRAVENOUS

## 2014-09-25 MED ORDER — LIDOCAINE HCL (CARDIAC) 20 MG/ML IV SOLN
INTRAVENOUS | Status: DC | PRN
  Administered 2014-09-25: 75 mg via INTRAVENOUS

## 2014-09-25 MED ORDER — HEPARIN SOD (PORK) LOCK FLUSH 100 UNIT/ML IV SOLN
INTRAVENOUS | Status: DC | PRN
  Administered 2014-09-25: 500 [IU] via INTRAVENOUS

## 2014-09-25 MED ORDER — OXYCODONE HCL 5 MG PO TABS: 5.0000 mg | ORAL_TABLET | Freq: Once | ORAL | Status: DC | PRN

## 2014-09-25 MED ORDER — PROPOFOL 10 MG/ML IV BOLUS
INTRAVENOUS | Status: DC | PRN
  Administered 2014-09-25: 200 mg via INTRAVENOUS

## 2014-09-25 MED ORDER — FENTANYL CITRATE 0.05 MG/ML IJ SOLN
INTRAMUSCULAR | Status: AC
  Filled 2014-09-25: qty 4

## 2014-09-25 MED ORDER — ONDANSETRON HCL 4 MG/2ML IJ SOLN: 4.0000 mg | Freq: Four times a day (QID) | INTRAMUSCULAR | Status: DC | PRN

## 2014-09-25 MED ORDER — HEPARIN (PORCINE) IN NACL 2-0.9 UNIT/ML-% IJ SOLN
INTRAMUSCULAR | Status: AC
  Filled 2014-09-25: qty 500

## 2014-09-25 MED ORDER — SODIUM CHLORIDE 0.9 % IV SOLN: 250.0000 mL | INTRAVENOUS | Status: DC | PRN

## 2014-09-25 MED ORDER — HEPARIN SOD (PORK) LOCK FLUSH 100 UNIT/ML IV SOLN
INTRAVENOUS | Status: AC
  Filled 2014-09-25: qty 5

## 2014-09-25 MED ORDER — SODIUM CHLORIDE 0.9 % IJ SOLN: 3.0000 mL | INTRAMUSCULAR | Status: DC | PRN

## 2014-09-25 MED ORDER — OXYCODONE HCL 5 MG/5ML PO SOLN: 5.0000 mg | Freq: Once | ORAL | Status: DC | PRN

## 2014-09-25 MED ORDER — ACETAMINOPHEN 325 MG PO TABS: 650.0000 mg | ORAL_TABLET | ORAL | Status: DC | PRN

## 2014-09-25 MED ORDER — MIDAZOLAM HCL 2 MG/2ML IJ SOLN: 1.0000 mg | INTRAMUSCULAR | Status: DC | PRN

## 2014-09-25 MED ORDER — CEFAZOLIN SODIUM 1-5 GM-% IV SOLN
INTRAVENOUS | Status: AC
  Filled 2014-09-25: qty 100

## 2014-09-25 MED ORDER — LIDOCAINE-EPINEPHRINE (PF) 1 %-1:200000 IJ SOLN
INTRAMUSCULAR | Status: AC
  Filled 2014-09-25: qty 10

## 2014-09-25 MED ORDER — CEFAZOLIN SODIUM-DEXTROSE 2-3 GM-% IV SOLR
2.0000 g | INTRAVENOUS | Status: AC
  Administered 2014-09-25: 2 g via INTRAVENOUS

## 2014-09-25 MED ORDER — BUPIVACAINE-EPINEPHRINE 0.5% -1:200000 IJ SOLN
INTRAMUSCULAR | Status: DC | PRN
  Administered 2014-09-25: 10 mL

## 2014-09-25 MED ORDER — FENTANYL CITRATE 0.05 MG/ML IJ SOLN
INTRAMUSCULAR | Status: DC | PRN
  Administered 2014-09-25: 100 ug via INTRAVENOUS

## 2014-09-25 MED ORDER — BUPIVACAINE HCL (PF) 0.25 % IJ SOLN
INTRAMUSCULAR | Status: AC
  Filled 2014-09-25: qty 30

## 2014-09-25 MED ORDER — MIDAZOLAM HCL 2 MG/2ML IJ SOLN
INTRAMUSCULAR | Status: AC
  Filled 2014-09-25: qty 2

## 2014-09-25 MED ORDER — PROPOFOL 10 MG/ML IV BOLUS
INTRAVENOUS | Status: AC
  Filled 2014-09-25: qty 20

## 2014-09-25 MED ORDER — DEXAMETHASONE SODIUM PHOSPHATE 4 MG/ML IJ SOLN
INTRAMUSCULAR | Status: DC | PRN
  Administered 2014-09-25: 10 mg via INTRAVENOUS

## 2014-09-25 MED ORDER — BUPIVACAINE-EPINEPHRINE (PF) 0.5% -1:200000 IJ SOLN
INTRAMUSCULAR | Status: AC
  Filled 2014-09-25: qty 30

## 2014-09-25 MED ORDER — ACETAMINOPHEN 650 MG RE SUPP: 650.0000 mg | RECTAL | Status: DC | PRN

## 2014-09-25 MED ORDER — ONDANSETRON HCL 4 MG/2ML IJ SOLN
INTRAMUSCULAR | Status: DC | PRN
  Administered 2014-09-25: 4 mg via INTRAVENOUS

## 2014-09-25 MED ORDER — LACTATED RINGERS IV SOLN
INTRAVENOUS | Status: DC
  Administered 2014-09-25 (×2): via INTRAVENOUS

## 2014-09-25 MED ORDER — MIDAZOLAM HCL 5 MG/5ML IJ SOLN
INTRAMUSCULAR | Status: DC | PRN
  Administered 2014-09-25: 2 mg via INTRAVENOUS

## 2014-09-25 MED ORDER — OXYCODONE HCL 5 MG PO TABS: 5.0000 mg | ORAL_TABLET | ORAL | Status: DC | PRN

## 2014-09-25 MED ORDER — FENTANYL CITRATE 0.05 MG/ML IJ SOLN: 50.0000 ug | INTRAMUSCULAR | Status: DC | PRN

## 2014-09-25 MED ORDER — FENTANYL CITRATE 0.05 MG/ML IJ SOLN: 25.0000 ug | INTRAMUSCULAR | Status: DC | PRN

## 2014-09-25 MED ORDER — SODIUM CHLORIDE 0.9 % IJ SOLN: 3.0000 mL | Freq: Two times a day (BID) | INTRAMUSCULAR | Status: DC

## 2014-09-25 SURGICAL SUPPLY — 42 items
BAG DECANTER FOR FLEXI CONT (MISCELLANEOUS) ×3 IMPLANT
BLADE HEX COATED 2.75 (ELECTRODE) ×3 IMPLANT
BLADE SURG 11 STRL SS (BLADE) ×3 IMPLANT
BLADE SURG 15 STRL LF DISP TIS (BLADE) ×1 IMPLANT
BLADE SURG 15 STRL SS (BLADE) ×2
CHLORAPREP W/TINT 26ML (MISCELLANEOUS) ×3 IMPLANT
COVER BACK TABLE 60X90IN (DRAPES) ×3 IMPLANT
COVER MAYO STAND STRL (DRAPES) ×3 IMPLANT
DECANTER SPIKE VIAL GLASS SM (MISCELLANEOUS) IMPLANT
DRAPE C-ARM 42X72 X-RAY (DRAPES) ×3 IMPLANT
DRAPE LAPAROTOMY TRNSV 102X78 (DRAPE) ×3 IMPLANT
DRAPE UTILITY XL STRL (DRAPES) ×3 IMPLANT
DRSG TEGADERM 4X4.75 (GAUZE/BANDAGES/DRESSINGS) IMPLANT
ELECT REM PT RETURN 9FT ADLT (ELECTROSURGICAL) ×3
ELECTRODE REM PT RTRN 9FT ADLT (ELECTROSURGICAL) ×1 IMPLANT
GLOVE BIO SURGEON STRL SZ 6 (GLOVE) ×3 IMPLANT
GLOVE BIO SURGEON STRL SZ 6.5 (GLOVE) ×4 IMPLANT
GLOVE BIO SURGEONS STRL SZ 6.5 (GLOVE) ×2
GLOVE BIOGEL PI IND STRL 6.5 (GLOVE) ×1 IMPLANT
GLOVE BIOGEL PI IND STRL 7.0 (GLOVE) ×2 IMPLANT
GLOVE BIOGEL PI INDICATOR 6.5 (GLOVE) ×2
GLOVE BIOGEL PI INDICATOR 7.0 (GLOVE) ×4
GOWN STRL REUS W/ TWL LRG LVL3 (GOWN DISPOSABLE) ×1 IMPLANT
GOWN STRL REUS W/TWL 2XL LVL3 (GOWN DISPOSABLE) ×3 IMPLANT
GOWN STRL REUS W/TWL LRG LVL3 (GOWN DISPOSABLE) ×2
KIT PORT POWER 8FR ISP CVUE (Catheter) ×3 IMPLANT
LIQUID BAND (GAUZE/BANDAGES/DRESSINGS) ×3 IMPLANT
NEEDLE HYPO 25X1 1.5 SAFETY (NEEDLE) ×3 IMPLANT
PACK BASIN DAY SURGERY FS (CUSTOM PROCEDURE TRAY) ×3 IMPLANT
PENCIL BUTTON HOLSTER BLD 10FT (ELECTRODE) ×3 IMPLANT
SLEEVE SCD COMPRESS KNEE MED (MISCELLANEOUS) ×3 IMPLANT
SPONGE GAUZE 4X4 12PLY STER LF (GAUZE/BANDAGES/DRESSINGS) IMPLANT
SUT MNCRL AB 4-0 PS2 18 (SUTURE) ×3 IMPLANT
SUT PROLENE 2 0 SH DA (SUTURE) ×6 IMPLANT
SUT VIC AB 3-0 SH 27 (SUTURE) ×2
SUT VIC AB 3-0 SH 27X BRD (SUTURE) ×1 IMPLANT
SUT VICRYL 3-0 CR8 SH (SUTURE) IMPLANT
SYR 5ML LUER SLIP (SYRINGE) ×3 IMPLANT
SYR CONTROL 10ML LL (SYRINGE) ×3 IMPLANT
SYRINGE 10CC LL (SYRINGE) ×3 IMPLANT
TOWEL OR 17X24 6PK STRL BLUE (TOWEL DISPOSABLE) ×3 IMPLANT
TOWEL OR NON WOVEN STRL DISP B (DISPOSABLE) ×3 IMPLANT

## 2014-09-25 NOTE — H&P (Signed)
Richard Cantu is an 58 y.o. male.   Chief Complaint: pancreatic cancer HPI:  Pt presented to hospital with pancreatitis, nausea and vomiting.  He has chronic pancreatitis and was under close follow up for a pseudocyst.  He was admitted and repeat EUS was positive for pancreatic cancer.  It appears that he currently has arterial involvement and is not resectable.  He is set up for chemotherapy.    Past Medical History  Diagnosis Date  . Hemorrhoids   . Chronic pancreatitis   . Pancreatic cyst dx'd 08/03/2014  . History of gout   . Anxiety   . 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 HTN (hypertension)   . Wears glasses   . GERD (gastroesophageal reflux disease)     Past Surgical History  Procedure Laterality Date  . Colonoscopy    . Eus N/A 08/03/2014    Procedure: ESOPHAGEAL ENDOSCOPIC ULTRASOUND (EUS) RADIAL;  Surgeon: Arta Silence, MD;  Location: [facility];  Service: Endoscopy;  Laterality: N/A;  H&P in file  . Eus N/A 09/04/2014    Procedure: ESOPHAGEAL ENDOSCOPIC ULTRASOUND (EUS) RADIAL;  Surgeon: Arta Silence, MD;  Location: [facility];  Service: Endoscopy;  Laterality: N/A;  . Fine needle aspiration N/A 09/04/2014    Procedure: FINE NEEDLE ASPIRATION (FNA) RADIAL;  Surgeon: Arta Silence, MD;  Location: [facility];  Service: Endoscopy;  Laterality: N/A;    History reviewed. No pertinent family history. Social History:  reports that he quit smoking about 3 years ago. His smoking use included Cigarettes. He has a 15 pack-year smoking history. He has never used smokeless tobacco. He reports that he drinks alcohol. He reports that he does not use illicit drugs.  Allergies: No Known Allergies  Medications Prior to Admission  Medication Sig Dispense Refill  . ALPRAZolam (XANAX) 1 MG tablet Take 1 tablet (1 mg total) by mouth at bedtime as needed for anxiety. 30 tablet 0  . fentaNYL (DURAGESIC - DOSED MCG/HR) 100 MCG/HR Place 1 patch (100 mcg total) onto the  skin every 3 (three) days. 5 patch 0  . ibuprofen (ADVIL,MOTRIN) 200 MG tablet Take 800 mg by mouth every 8 (eight) hours as needed for moderate pain.    Marland Kitchen lactose free nutrition (BOOST) LIQD Take 237 mLs by mouth 4 (four) times daily.    Marland Kitchen lidocaine-prilocaine (EMLA) cream Apply 1 application topically as needed. Apply to [facility] site 1-2 hours prior to stick and cover with plastic wrap 30 g 11  . lipase/protease/amylase (CREON) 12000 UNITS CPEP capsule Take 36,000 Units by mouth 3 (three) times daily with meals. Has sample bottle Lipase 36,000- Protease 114,000- Amylase 180,000    . morphine (MSIR) 15 MG tablet Take 1-2 tablets (15-30 mg total) by mouth every 4 (four) hours as needed for severe pain. 75 tablet 0  . pantoprazole (PROTONIX) 40 MG tablet Take 40 mg by mouth daily.    . polyethylene glycol (MIRALAX / GLYCOLAX) packet Take 17 g by mouth daily. 14 each 0  . Probiotic Product (PROBIOTIC DAILY PO) Take 1 tablet by mouth daily.    Marland Kitchen senna-docusate (SENOKOT-S) 8.6-50 MG per tablet Take 1 tablet by mouth 2 (two) times daily.    . ondansetron (ZOFRAN) 8 MG tablet Take 1 tablet (8 mg total) by mouth every 8 (eight) hours as needed for nausea or vomiting. 20 tablet 0  . prochlorperazine (COMPAZINE) 10 MG tablet Take 1 tablet (10 mg total) by mouth every 6 (six) hours as needed for  nausea. 60 tablet 1    No results found for this or any previous visit (from the past 48 hour(s)). No results found.  Review of Systems  All other systems reviewed and are negative.   Blood pressure 109/73, pulse 77, temperature 98.2 F (36.8 C), temperature source Oral, resp. rate 18, height 5\' 9"  (1.753 m), weight 178 lb (80.74 kg), SpO2 100 %. Physical Exam  Constitutional: He is oriented to person, place, and time. He appears well-developed. No distress.  HENT:  Head: Normocephalic and atraumatic.  Eyes: Pupils are equal, round, and reactive to light. No scleral icterus.  Neck: Neck supple.   Cardiovascular: Normal rate.   Respiratory: Effort normal.  GI: Soft.  Musculoskeletal: Normal range of motion.  Neurological: He is alert and oriented to person, place, and time.  Skin: Skin is warm and dry. He is not diaphoretic.  Psychiatric: He has a normal mood and affect. His behavior is normal. Judgment and thought content normal.     Assessment/Plan Pancreatic cancer Port a cath.   Discussed risks and benefits of procedure. Discussed risk of pneumothorax.    Adrijana Haros 09/25/2014, 10:41 AM

## 2014-09-25 NOTE — Discharge Instructions (Addendum)
[place] Office Phone Number [phone]   POST OP INSTRUCTIONS  Always review your discharge instruction sheet given to you by the facility where your surgery was performed.  IF YOU HAVE DISABILITY OR FAMILY LEAVE FORMS, YOU MUST BRING THEM TO THE OFFICE FOR PROCESSING.  DO NOT GIVE THEM TO YOUR DOCTOR.  1. A prescription for pain medication may be given to you upon discharge.  Take your pain medication as prescribed, if needed.  If narcotic pain medicine is not needed, then you may take acetaminophen (Tylenol) or ibuprofen (Advil) as needed. 2. Take your usually prescribed medications unless otherwise directed 3. If you need a refill on your pain medication, please contact your pharmacy.  They will contact our office to request authorization.  Prescriptions will not be filled after 5pm or on week-ends. 4. You should eat very light the first 24 hours after surgery, such as soup, crackers, pudding, etc.  Resume your normal diet the day after surgery 5. It is common to experience some constipation if taking pain medication after surgery.  Increasing fluid intake and taking a stool softener will usually help or prevent this problem from occurring.  A mild laxative (Milk of Magnesia or Miralax) should be taken according to package directions if there are no bowel movements after 48 hours. 6. You may shower in 48 hours.  The surgical glue will flake off in 2-3 weeks.   7. ACTIVITIES:  No strenuous activity or heavy lifting for 1 week.   a. You may drive when you no longer are taking prescription pain medication, you can comfortably wear a seatbelt, and you can safely maneuver your car and apply brakes. b. RETURN TO WORK:  __________to be determined.  _____________ Dennis Bast should see your doctor in the office for a follow-up appointment approximately three-four weeks after your surgery.    WHEN TO CALL YOUR DOCTOR: 1. Fever over 101.0 2. Nausea and/or vomiting. 3. Extreme swelling  or bruising. 4. Continued bleeding from incision. 5. Increased pain, redness, or drainage from the incision.  The clinic staff is available to answer your questions during regular business hours.  Please dont hesitate to call and ask to speak to one of the nurses for clinical concerns.  If you have a medical emergency, go to the nearest emergency room or call 911.  A surgeon from [facility] is always on call at the hospital.  For further questions, please visit [email or web address]     Post Anesthesia Home Care Instructions  Activity: Get plenty of rest for the remainder of the day. A responsible adult should stay with you for 24 hours following the procedure.  For the next 24 hours, DO NOT: -Drive a car -Paediatric nurse -Drink alcoholic beverages -Take any medication unless instructed by your physician -Make any legal decisions or sign important papers.  Meals: Start with liquid foods such as gelatin or soup. Progress to regular foods as tolerated. Avoid greasy, spicy, heavy foods. If nausea and/or vomiting occur, drink only clear liquids until the nausea and/or vomiting subsides. Call your physician if vomiting continues.  Special Instructions/Symptoms: Your throat may feel dry or sore from the anesthesia or the breathing tube placed in your throat during surgery. If this causes discomfort, gargle with warm salt water. The discomfort should disappear within 24 hours.  Call your surgeon if you experience:   1.  Fever over 101.0. 2.  Inability to urinate. 3.  Nausea and/or vomiting. 4.  Extreme swelling or bruising at the  surgical site. 5.  Continued bleeding from the incision. 6.  Increased pain, redness or drainage from the incision. 7.  Problems related to your pain medication. 8. Any change in color, movement and/or sensation 9. Any problems and/or concerns

## 2014-09-25 NOTE — Anesthesia Procedure Notes (Signed)
Procedure Name: LMA Insertion Date/Time: 09/25/2014 12:05 PM Performed by: Lyndee Leo Pre-anesthesia Checklist: Patient identified, Emergency Drugs available, Suction available and Patient being monitored Patient Re-evaluated:Patient Re-evaluated prior to inductionOxygen Delivery Method: Circle System Utilized Preoxygenation: Pre-oxygenation with 100% oxygen Intubation Type: IV induction Ventilation: Mask ventilation without difficulty LMA: LMA inserted LMA Size: 4.0 Number of attempts: 1 Airway Equipment and Method: bite block Placement Confirmation: positive ETCO2 Tube secured with: Tape Dental Injury: Teeth and Oropharynx as per pre-operative assessment

## 2014-09-25 NOTE — Anesthesia Preprocedure Evaluation (Signed)
Anesthesia Evaluation  Patient identified by MRN, date of birth, ID band Patient awake    Reviewed: Allergy & Precautions, H&P , NPO status , Unable to perform ROS - Chart review only  Airway Mallampati: II   Neck ROM: full    Dental   Pulmonary former smoker,          Cardiovascular hypertension,     Neuro/Psych Anxiety    GI/Hepatic GERD-  ,  Endo/Other    Renal/GU      Musculoskeletal   Abdominal   Peds  Hematology   Anesthesia Other Findings   Reproductive/Obstetrics                             Anesthesia Physical Anesthesia Plan  ASA: II  Anesthesia Plan: General   Post-op Pain Management:    Induction: Intravenous  Airway Management Planned: LMA  Additional Equipment:   Intra-op Plan:   Post-operative Plan:   Informed Consent: I have reviewed the patients History and Physical, chart, labs and discussed the procedure including the risks, benefits and alternatives for the proposed anesthesia with the patient or authorized representative who has indicated his/her understanding and acceptance.     Plan Discussed with: CRNA, Anesthesiologist and Surgeon  Anesthesia Plan Comments:         Anesthesia Quick Evaluation

## 2014-09-25 NOTE — Transfer of Care (Signed)
Immediate Anesthesia Transfer of Care Note  Patient: Richard Cantu  Procedure(s) Performed: Procedure(s): INSERTION PORT-A-CATH (Left)  Patient Location: PACU  Anesthesia Type:General  Level of Consciousness: awake, sedated and patient cooperative  Airway & Oxygen Therapy: Patient Spontanous Breathing and Patient connected to face mask oxygen  Post-op Assessment: Report given to PACU RN and Post -op Vital signs reviewed and stable  Post vital signs: Reviewed and stable  Complications: No apparent anesthesia complications

## 2014-09-25 NOTE — Anesthesia Postprocedure Evaluation (Signed)
Anesthesia Post Note  Patient: Richard Cantu  Procedure(s) Performed: Procedure(s) (LRB): INSERTION PORT-A-CATH (Left)  Anesthesia type: General  Patient location: PACU  Post pain: Pain level controlled and Adequate analgesia  Post assessment: Post-op Vital signs reviewed, Patient's Cardiovascular Status Stable, Respiratory Function Stable, Patent Airway and Pain level controlled  Last Vitals:  Filed Vitals:   09/25/14 1415  BP: 130/87  Pulse: 70  Temp: 36.8 C  Resp: 18    Post vital signs: Reviewed and stable  Level of consciousness: awake, alert  and oriented  Complications: No apparent anesthesia complications

## 2014-09-25 NOTE — Op Note (Signed)
PREOPERATIVE DIAGNOSIS:  Pancreatic cancer     POSTOPERATIVE DIAGNOSIS:  Same     PROCEDURE: left subclavian port placement, Bard ClearVue  Power Port, MRI safe, 8-French.      SURGEON:  Stark Klein, MD      ANESTHESIA:  General   FINDINGS:  Good venous return, easy flush, and tip of the catheter and   SVC 24 cm.      SPECIMEN:  None.      ESTIMATED BLOOD LOSS:  Minimal.      COMPLICATIONS:  None known.      PROCEDURE:  Pt was identified in the holding area and taken to   the operating room, where patient was placed supine on the operating room   table.  General anesthesia was induced.  Patient's arms were tucked and the upper   chest and neck were prepped and draped in sterile fashion.  Time-out was   performed according to the surgical safety check list.  When all was   correct, we continued.   Local anesthetic was administered over this   area at the angle of the clavicle.  The vein was accessed with 1 pass of the needle. There was good venous return and the wire passed easily with no ectopy.   Fluoroscopy was used to confirm that the wire was in the vena cava.      The patient was placed back level and the area for the pocket was anethetized   with local anesthetic.  A 3-cm transverse incision was made with a #15   blade.  Cautery was used to divide the subcutaneous tissues down to the   pectoralis muscle.  An [employer] retractor was used to elevate the skin   while a pocket was created on top of the pectoralis fascia.  The port   was placed into the pocket to confirm that it was of adequate size.  The   catheter was preattached to the port.  The port was then secured to the   pectoralis fascia with four 2-0 Prolene sutures.  These were clamped and   not tied down yet.    The catheter was tunneled through to the wire exit   site.  The catheter was placed along the wire to determine what length it should be to be in the SVC.  The catheter was cut at 24 cm.  The tunneler  sheath and dilator were passed over the wire and the dilator and wire were removed.  The catheter was advanced through the tunneler sheath and the tunneler sheath was pulled away.  Care was taken to keep the catheter in the tunneler sheath as this occurred. This was advanced and the tunneler sheath was removed.  There was good venous   return and easy flush of the catheter.  The Prolene sutures were tied   down to the pectoral fascia.  The skin was reapproximated using 3-0   Vicryl interrupted deep dermal sutures.    Fluoroscopy was used to re-confirm good position of the catheter.  The skin   was then closed using 4-0 Monocryl in a subcuticular fashion.  The port was flushed with concentrated heparin flush as well.  The wounds were then cleaned, dried, and dressed with Dermabond.  The patient was awakened from anesthesia and taken to the PACU in stable condition.  Needle, sponge, and instrument counts were correct.               Stark Klein, MD

## 2014-09-26 ENCOUNTER — Ambulatory Visit (HOSPITAL_BASED_OUTPATIENT_CLINIC_OR_DEPARTMENT_OTHER): Payer: BC Managed Care – PPO

## 2014-09-26 ENCOUNTER — Encounter: Payer: Self-pay | Admitting: *Deleted

## 2014-09-26 ENCOUNTER — Other Ambulatory Visit: Payer: Self-pay | Admitting: Medical Oncology

## 2014-09-26 ENCOUNTER — Other Ambulatory Visit: Payer: Self-pay | Admitting: *Deleted

## 2014-09-26 DIAGNOSIS — C250 Malignant neoplasm of head of pancreas: Secondary | ICD-10-CM

## 2014-09-26 DIAGNOSIS — Z5111 Encounter for antineoplastic chemotherapy: Secondary | ICD-10-CM

## 2014-09-26 MED ORDER — DEXTROSE 5 % IV SOLN
Freq: Once | INTRAVENOUS | Status: AC
  Administered 2014-09-26: 13:00:00 via INTRAVENOUS

## 2014-09-26 MED ORDER — PALONOSETRON HCL INJECTION 0.25 MG/5ML
0.2500 mg | Freq: Once | INTRAVENOUS | Status: AC
  Administered 2014-09-26: 0.25 mg via INTRAVENOUS

## 2014-09-26 MED ORDER — ATROPINE SULFATE 1 MG/ML IJ SOLN
INTRAMUSCULAR | Status: AC
  Filled 2014-09-26: qty 1

## 2014-09-26 MED ORDER — LORAZEPAM 2 MG/ML IJ SOLN
0.5000 mg | Freq: Once | INTRAMUSCULAR | Status: AC
  Administered 2014-09-26: 0.5 mg via INTRAVENOUS

## 2014-09-26 MED ORDER — PROCHLORPERAZINE EDISYLATE 5 MG/ML IJ SOLN
INTRAMUSCULAR | Status: AC
  Filled 2014-09-26: qty 2

## 2014-09-26 MED ORDER — LEUCOVORIN CALCIUM INJECTION 350 MG
400.0000 mg/m2 | Freq: Once | INTRAVENOUS | Status: AC
  Administered 2014-09-26: 796 mg via INTRAVENOUS
  Filled 2014-09-26: qty 39.8

## 2014-09-26 MED ORDER — DEXTROSE 5 % IV SOLN
181.0000 mg/m2 | Freq: Once | INTRAVENOUS | Status: AC
  Administered 2014-09-26: 360 mg via INTRAVENOUS
  Filled 2014-09-26: qty 18

## 2014-09-26 MED ORDER — DEXTROSE 5 % IV SOLN
85.0000 mg/m2 | Freq: Once | INTRAVENOUS | Status: AC
  Administered 2014-09-26: 170 mg via INTRAVENOUS
  Filled 2014-09-26: qty 34

## 2014-09-26 MED ORDER — DEXAMETHASONE SODIUM PHOSPHATE 20 MG/5ML IJ SOLN
INTRAMUSCULAR | Status: AC
  Filled 2014-09-26: qty 5

## 2014-09-26 MED ORDER — PALONOSETRON HCL INJECTION 0.25 MG/5ML
INTRAVENOUS | Status: AC
  Filled 2014-09-26: qty 5

## 2014-09-26 MED ORDER — MORPHINE SULFATE ER 60 MG PO TBCR: 60.0000 mg | EXTENDED_RELEASE_TABLET | Freq: Two times a day (BID) | ORAL | Status: DC

## 2014-09-26 MED ORDER — LORAZEPAM 2 MG/ML IJ SOLN
INTRAMUSCULAR | Status: AC
  Filled 2014-09-26: qty 1

## 2014-09-26 MED ORDER — SODIUM CHLORIDE 0.9 % IV SOLN
2400.0000 mg/m2 | INTRAVENOUS | Status: DC
  Administered 2014-09-26: 4800 mg via INTRAVENOUS
  Filled 2014-09-26: qty 96

## 2014-09-26 MED ORDER — DEXAMETHASONE SODIUM PHOSPHATE 20 MG/5ML IJ SOLN
20.0000 mg | Freq: Once | INTRAMUSCULAR | Status: AC
  Administered 2014-09-26: 20 mg via INTRAVENOUS

## 2014-09-26 MED ORDER — PROCHLORPERAZINE EDISYLATE 5 MG/ML IJ SOLN
10.0000 mg | Freq: Four times a day (QID) | INTRAMUSCULAR | Status: DC | PRN
  Administered 2014-09-26: 10 mg via INTRAVENOUS

## 2014-09-26 MED ORDER — ATROPINE SULFATE 1 MG/ML IJ SOLN
0.5000 mg | Freq: Once | INTRAMUSCULAR | Status: AC | PRN
  Administered 2014-09-26: 0.5 mg via INTRAVENOUS

## 2014-09-26 NOTE — Progress Notes (Signed)
1648 Patient vomiting in bathroom. Irinotecan placed on hold.Per NP, Ned Card,  V.O. R & A for compazine 10 mg IV to be given. If pt feeling better, to restart Irinotecan.

## 2014-09-26 NOTE — Progress Notes (Signed)
1530: Pt stated he was feeling hot. T 97.6 1550: pt continued to feel hot T 98.2, got up to walk around, began to vomit, no diarrhea- Camptosar paused, BP 191/92, pulse 101, resp 20, temp 98.2. 1607: ativan .5 mg IV given, pt stated he felt better after vomiting. 1615: Camptosar restarted. BP 155/85, pulse 102, temp 98.4 1619: pt states he feels a little better, having pain in abdomen. 1639: pt states he is feeling better

## 2014-09-26 NOTE — Patient Instructions (Signed)
[facility] Discharge Instructions for Patients Receiving Chemotherapy  Today you received the following chemotherapy agents FOLFIRINOX  To help prevent nausea and vomiting after your treatment, we encourage you to take your nausea medication as needed   If you develop nausea and vomiting that is not controlled by your nausea medication, call the clinic.   BELOW ARE SYMPTOMS THAT SHOULD BE REPORTED IMMEDIATELY:  *FEVER GREATER THAN 100.5 F  *CHILLS WITH OR WITHOUT FEVER  NAUSEA AND VOMITING THAT IS NOT CONTROLLED WITH YOUR NAUSEA MEDICATION  *UNUSUAL SHORTNESS OF BREATH  *UNUSUAL BRUISING OR BLEEDING  TENDERNESS IN MOUTH AND THROAT WITH OR WITHOUT PRESENCE OF ULCERS  *URINARY PROBLEMS  *BOWEL PROBLEMS  UNUSUAL RASH Items with * indicate a potential emergency and should be followed up as soon as possible.  Feel free to call the clinic you have any questions or concerns. The clinic phone number is [phone].    

## 2014-09-26 NOTE — Progress Notes (Signed)
[place] Psychosocial Distress Screening Clinical Social Work  Clinical Social Work was referred by distress screening protocol.  The patient scored a 5 on the Psychosocial Distress Thermometer which indicates moderate distress. Clinical Social Worker spoke with patient previously to discuss SSD and support services resources.   CSW also assessed for distress and other psychosocial needs.  Patient was appreciative of the information/resources provided, and did nto express any additional needs at this time.  CSW encouraged patient to call with any questions or concerns.       ONCBCN DISTRESS SCREENING 09/19/2014  Screening Type Initial Screening  Distress experienced in past week (1-10) 5  Practical problem type Housing;Insurance;Work/school  Emotional problem type Nervousness/Anxiety;Adjusting to illness;Boredom  Spiritual/Religous concerns type Facing my mortality  Physical Problem type Pain;Sleep/insomnia;Loss of appetitie;Constipation/diarrhea;Changes in urination;Tingling hands/feet;Skin dry/itchy  Physician notified of physical symptoms Yes  Referral to clinical psychology No  Referral to clinical social work Yes  Referral to dietition No  Referral to financial advocate No  Referral to support programs No  Referral to palliative care No   Johnnye Lana, MSW, LCSW, OSW-C Clinical Social Worker [place] [phone]

## 2014-09-27 ENCOUNTER — Encounter (HOSPITAL_BASED_OUTPATIENT_CLINIC_OR_DEPARTMENT_OTHER): Payer: Self-pay | Admitting: General Surgery

## 2014-09-27 ENCOUNTER — Telehealth: Payer: Self-pay | Admitting: *Deleted

## 2014-09-27 NOTE — Telephone Encounter (Signed)
-----   Message from Renford Dills, RN sent at 09/26/2014  4:43 PM EST ----- Regarding: chemo follow-up call 1st FOLFIRINOX Dr. Benay Spice [phone]

## 2014-09-27 NOTE — Telephone Encounter (Signed)
[place] for chemotherapy F/U.  Patient is fair.  "Vomited when try to eat and drink so afraid to.  Trying Hawiian punch now because I haven't got in very much today.  Tried compazine last night and havn't used it today.  I will try this now and see if it helps.  I think it's the first time treatment is why this is happening."  Denies any further side effects or symptoms.  Bowels moved last night and were normal.  Bladder is functioning well but noticed urine is darker yellow.  Because he is not eating and drinking well and I instructed to try broth, un-refrigerated jello, hot tea to drink 64 oz minimum daily or at least the day before, of and after treatment.  Asked that he call early tomorrow to be worked in for IVF and new anti-emetic if compazine isn't working this evening.  Denies further questions at this time and encouraged to call if needed.  Reviewed how to call after hours in the case of an emergency.

## 2014-09-28 ENCOUNTER — Telehealth: Payer: Self-pay | Admitting: *Deleted

## 2014-09-28 ENCOUNTER — Other Ambulatory Visit: Payer: Self-pay | Admitting: *Deleted

## 2014-09-28 ENCOUNTER — Ambulatory Visit: Payer: BC Managed Care – PPO

## 2014-09-28 ENCOUNTER — Ambulatory Visit (HOSPITAL_BASED_OUTPATIENT_CLINIC_OR_DEPARTMENT_OTHER): Payer: BC Managed Care – PPO

## 2014-09-28 DIAGNOSIS — C250 Malignant neoplasm of head of pancreas: Secondary | ICD-10-CM

## 2014-09-28 MED ORDER — HEPARIN SOD (PORK) LOCK FLUSH 100 UNIT/ML IV SOLN
500.0000 [IU] | Freq: Once | INTRAVENOUS | Status: DC | PRN
  Filled 2014-09-28: qty 5

## 2014-09-28 MED ORDER — SODIUM CHLORIDE 0.9 % IV SOLN
Freq: Once | INTRAVENOUS | Status: DC
  Administered 2014-09-28: 16:00:00 via INTRAVENOUS

## 2014-09-28 MED ORDER — SODIUM CHLORIDE 0.9 % IJ SOLN
10.0000 mL | INTRAMUSCULAR | Status: DC | PRN
  Filled 2014-09-28: qty 10

## 2014-09-28 NOTE — Telephone Encounter (Signed)
Spoke with patient. States he is doing well with pain mgmt. Taking MS contin, but is not needing MSIR- only occasionally. Vomited last night, none today. Took compazine ~ 1:00, has "not been eating or drinking very much". Encouraged pt to drink fluids prior to coming to [facility] for chemo pump removal to make sure he is able to tolerate. Can add on for IVF if needed. Pt will talk with flush nurse about his po intake.

## 2014-09-28 NOTE — Patient Instructions (Signed)

## 2014-09-28 NOTE — Progress Notes (Signed)
Pt has been nauseated. Vomited yesterday. "It's been rough". He has not been able to drink much. S/w Dr Gearldine Shown nurse. Pt sent to infusion for 1 liter of fluids. Pump still had 6.9 ml to infuse. Pump left insitu.

## 2014-10-03 ENCOUNTER — Telehealth: Payer: Self-pay | Admitting: *Deleted

## 2014-10-03 MED ORDER — MORPHINE SULFATE 15 MG PO TABS: 15.0000 mg | ORAL_TABLET | ORAL | Status: DC | PRN

## 2014-10-03 NOTE — Telephone Encounter (Signed)
Message from pt requesting MSIR refill. Rx will be left in prescription book for pick up.

## 2014-10-07 ENCOUNTER — Other Ambulatory Visit: Payer: Self-pay | Admitting: Oncology

## 2014-10-10 ENCOUNTER — Other Ambulatory Visit (HOSPITAL_BASED_OUTPATIENT_CLINIC_OR_DEPARTMENT_OTHER): Payer: BC Managed Care – PPO

## 2014-10-10 ENCOUNTER — Telehealth: Payer: Self-pay | Admitting: Oncology

## 2014-10-10 ENCOUNTER — Ambulatory Visit (HOSPITAL_BASED_OUTPATIENT_CLINIC_OR_DEPARTMENT_OTHER): Payer: BC Managed Care – PPO

## 2014-10-10 ENCOUNTER — Other Ambulatory Visit: Payer: Self-pay | Admitting: *Deleted

## 2014-10-10 ENCOUNTER — Encounter: Payer: Self-pay | Admitting: *Deleted

## 2014-10-10 ENCOUNTER — Ambulatory Visit (HOSPITAL_BASED_OUTPATIENT_CLINIC_OR_DEPARTMENT_OTHER): Payer: BC Managed Care – PPO | Admitting: Oncology

## 2014-10-10 VITALS — BP 99/63 | HR 90 | Temp 97.7°F | Resp 18 | Ht 69.0 in | Wt 169.5 lb

## 2014-10-10 DIAGNOSIS — G893 Neoplasm related pain (acute) (chronic): Secondary | ICD-10-CM

## 2014-10-10 DIAGNOSIS — C250 Malignant neoplasm of head of pancreas: Secondary | ICD-10-CM

## 2014-10-10 DIAGNOSIS — Z5111 Encounter for antineoplastic chemotherapy: Secondary | ICD-10-CM

## 2014-10-10 LAB — CBC WITH DIFFERENTIAL/PLATELET
BASO%: 0.9 % (ref 0.0–2.0)
Basophils Absolute: 0.1 10*3/uL (ref 0.0–0.1)
EOS ABS: 0.4 10*3/uL (ref 0.0–0.5)
EOS%: 5.8 % (ref 0.0–7.0)
HEMATOCRIT: 35 % — AB (ref 38.4–49.9)
HGB: 12.4 g/dL — ABNORMAL LOW (ref 13.0–17.1)
LYMPH%: 27.3 % (ref 14.0–49.0)
MCH: 31.2 pg (ref 27.2–33.4)
MCHC: 35.4 g/dL (ref 32.0–36.0)
MCV: 87.9 fL (ref 79.3–98.0)
MONO#: 0.9 10*3/uL (ref 0.1–0.9)
MONO%: 12.9 % (ref 0.0–14.0)
NEUT%: 53.1 % (ref 39.0–75.0)
NEUTROS ABS: 3.7 10*3/uL (ref 1.5–6.5)
PLATELETS: 293 10*3/uL (ref 140–400)
RBC: 3.98 10*6/uL — ABNORMAL LOW (ref 4.20–5.82)
RDW: 12 % (ref 11.0–14.6)
WBC: 6.9 10*3/uL (ref 4.0–10.3)
lymph#: 1.9 10*3/uL (ref 0.9–3.3)

## 2014-10-10 LAB — COMPREHENSIVE METABOLIC PANEL (CC13)
ALT: 26 U/L (ref 0–55)
ANION GAP: 8 meq/L (ref 3–11)
AST: 15 U/L (ref 5–34)
Albumin: 3.5 g/dL (ref 3.5–5.0)
Alkaline Phosphatase: 69 U/L (ref 40–150)
BILIRUBIN TOTAL: 0.49 mg/dL (ref 0.20–1.20)
BUN: 16.3 mg/dL (ref 7.0–26.0)
CO2: 28 mEq/L (ref 22–29)
CREATININE: 0.9 mg/dL (ref 0.7–1.3)
Calcium: 9.3 mg/dL (ref 8.4–10.4)
Chloride: 107 mEq/L (ref 98–109)
GLUCOSE: 122 mg/dL (ref 70–140)
Potassium: 4.3 mEq/L (ref 3.5–5.1)
Sodium: 143 mEq/L (ref 136–145)
TOTAL PROTEIN: 6.3 g/dL — AB (ref 6.4–8.3)

## 2014-10-10 MED ORDER — LEUCOVORIN CALCIUM INJECTION 350 MG
400.0000 mg/m2 | Freq: Once | INTRAVENOUS | Status: AC
  Administered 2014-10-10: 796 mg via INTRAVENOUS
  Filled 2014-10-10: qty 39.8

## 2014-10-10 MED ORDER — DEXTROSE 5 % IV SOLN
Freq: Once | INTRAVENOUS | Status: AC
  Administered 2014-10-10: 12:00:00 via INTRAVENOUS

## 2014-10-10 MED ORDER — DEXAMETHASONE SODIUM PHOSPHATE 20 MG/5ML IJ SOLN: 20.0000 mg | Freq: Once | INTRAMUSCULAR | Status: DC

## 2014-10-10 MED ORDER — PALONOSETRON HCL INJECTION 0.25 MG/5ML
0.2500 mg | Freq: Once | INTRAVENOUS | Status: AC
  Administered 2014-10-10: 0.25 mg via INTRAVENOUS

## 2014-10-10 MED ORDER — ATROPINE SULFATE 1 MG/ML IJ SOLN
INTRAMUSCULAR | Status: AC
  Filled 2014-10-10: qty 1

## 2014-10-10 MED ORDER — ATROPINE SULFATE 1 MG/ML IJ SOLN
0.5000 mg | Freq: Once | INTRAMUSCULAR | Status: AC | PRN
  Administered 2014-10-10: 0.5 mg via INTRAVENOUS

## 2014-10-10 MED ORDER — DEXAMETHASONE 4 MG PO TABS: 8.0000 mg | ORAL_TABLET | Freq: Two times a day (BID) | ORAL | Status: DC

## 2014-10-10 MED ORDER — SODIUM CHLORIDE 0.9 % IV SOLN
150.0000 mg | Freq: Once | INTRAVENOUS | Status: AC
  Administered 2014-10-10: 150 mg via INTRAVENOUS
  Filled 2014-10-10: qty 5

## 2014-10-10 MED ORDER — SODIUM CHLORIDE 0.9 % IV SOLN
2400.0000 mg/m2 | INTRAVENOUS | Status: DC
  Administered 2014-10-10: 4800 mg via INTRAVENOUS
  Filled 2014-10-10: qty 96

## 2014-10-10 MED ORDER — DEXAMETHASONE SODIUM PHOSPHATE 20 MG/5ML IJ SOLN
12.0000 mg | Freq: Once | INTRAMUSCULAR | Status: AC
  Administered 2014-10-10: 12 mg via INTRAVENOUS

## 2014-10-10 MED ORDER — PALONOSETRON HCL INJECTION 0.25 MG/5ML
INTRAVENOUS | Status: AC
  Filled 2014-10-10: qty 5

## 2014-10-10 MED ORDER — IRINOTECAN HCL CHEMO INJECTION 100 MG/5ML
181.0000 mg/m2 | Freq: Once | INTRAVENOUS | Status: AC
  Administered 2014-10-10: 360 mg via INTRAVENOUS
  Filled 2014-10-10: qty 18

## 2014-10-10 MED ORDER — DEXAMETHASONE SODIUM PHOSPHATE 20 MG/5ML IJ SOLN
INTRAMUSCULAR | Status: AC
  Filled 2014-10-10: qty 5

## 2014-10-10 MED ORDER — OXALIPLATIN CHEMO INJECTION 100 MG/20ML
85.0000 mg/m2 | Freq: Once | INTRAVENOUS | Status: AC
  Administered 2014-10-10: 170 mg via INTRAVENOUS
  Filled 2014-10-10: qty 34

## 2014-10-10 NOTE — Progress Notes (Signed)
  [facility] OFFICE PROGRESS NOTE   Diagnosis: pancreas cancer  INTERVAL HISTORY:   He returns as scheduled. He completed a first cycle of FOLFIRINOX 09/26/2014. He developed nausea/vomiting and diarrhea at the end of the chemotherapy infusion on day 1. He reports multiple episodes of nausea and vomiting on the evening of day 1. Mild nausea on day 2. The nausea then resolved. The pain is under better control with the current narcotic regimen. No significant neuropathy symptoms. No mouth sores. No diarrhea after day 1.  Objective:  Vital signs in last 24 hours:  Blood pressure 99/63, pulse 90, temperature 97.7 F (36.5 C), temperature source Oral, resp. rate 18, height 5\' 9"  (1.753 m), weight 169 lb 8 oz (76.885 kg), SpO2 100 %.    HEENT: no thrush or ulcers Resp: lungs clear bilaterally Cardio: regular rate and rhythm GI: no hepatomegaly, mild tenderness in the mid upper abdomen Vascular: no leg edema    Portacath/PICC-without erythema  Lab Results:  Lab Results  Component Value Date   WBC 6.9 10/10/2014   HGB 12.4* 10/10/2014   HCT 35.0* 10/10/2014   MCV 87.9 10/10/2014   PLT 293 10/10/2014   NEUTROABS 3.7 10/10/2014    Medications: I have reviewed the patient's current medications.  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 Pain secondary to #1   3. psoriasis         4. Acute nausea and vomiting with cycle 1 FOLFIRINOX-emend and prophylactic Decadron will be added with cycle 2    Disposition:  Mr. Heinbaugh has completed 1 cycle FOLFIRINOX. He developed acute nausea/vomiting and diarrhea at the completion of chemotherapy on day 1 followed by additional episodes of nausea/vomiting on the  evening of day 1. Emend will be added with day 1 cycle 2 and he will receive prophylactic Decadron on day 2. Atropine may help the acute diarrhea. He otherwise tolerated the chemotherapy well. The plan is to proceed with cycle 2 FOLFIRINOX today. He will return for an office visit and cycle 3 in 2 weeks. He will be referred to the cancer center nutritionist.  Betsy Coder, MD  10/10/2014  3:21 PM

## 2014-10-10 NOTE — Telephone Encounter (Signed)
Gave avs & cal forDec. Sent mess to sch tx. °

## 2014-10-10 NOTE — Patient Instructions (Addendum)
Take Prochloraperazine for nausea for the next 3 days. Then you can take the Ondansetron starting 11/21 as needed for nausea.     [facility]  Discharge Instructions for Patients Receiving Chemotherapy  Today you received the following chemotherapy agents Oxaliplatin, Leucovorin, Camptosar and 5FU.   To help prevent nausea and vomiting after your treatment, we encourage you to take your nausea medication.   If you develop nausea and vomiting that is not controlled by your nausea medication, call the clinic.   BELOW ARE SYMPTOMS THAT SHOULD BE REPORTED IMMEDIATELY:  *FEVER GREATER THAN 100.5 F  *CHILLS WITH OR WITHOUT FEVER  NAUSEA AND VOMITING THAT IS NOT CONTROLLED WITH YOUR NAUSEA MEDICATION  *UNUSUAL SHORTNESS OF BREATH  *UNUSUAL BRUISING OR BLEEDING  TENDERNESS IN MOUTH AND THROAT WITH OR WITHOUT PRESENCE OF ULCERS  *URINARY PROBLEMS  *BOWEL PROBLEMS  UNUSUAL RASH Items with * indicate a potential emergency and should be followed up as soon as possible.  Feel free to call the clinic you have any questions or concerns. The clinic phone number is [phone].

## 2014-10-11 ENCOUNTER — Other Ambulatory Visit: Payer: Self-pay | Admitting: Emergency Medicine

## 2014-10-11 DIAGNOSIS — C250 Malignant neoplasm of head of pancreas: Secondary | ICD-10-CM

## 2014-10-12 ENCOUNTER — Ambulatory Visit (HOSPITAL_BASED_OUTPATIENT_CLINIC_OR_DEPARTMENT_OTHER): Payer: BC Managed Care – PPO

## 2014-10-12 DIAGNOSIS — C250 Malignant neoplasm of head of pancreas: Secondary | ICD-10-CM

## 2014-10-12 MED ORDER — SODIUM CHLORIDE 0.9 % IJ SOLN
10.0000 mL | INTRAMUSCULAR | Status: DC | PRN
  Administered 2014-10-12: 10 mL
  Filled 2014-10-12: qty 10

## 2014-10-12 MED ORDER — HEPARIN SOD (PORK) LOCK FLUSH 100 UNIT/ML IV SOLN
500.0000 [IU] | Freq: Once | INTRAVENOUS | Status: AC | PRN
  Administered 2014-10-12: 500 [IU]
  Filled 2014-10-12: qty 5

## 2014-10-12 NOTE — Patient Instructions (Signed)
Fluorouracil, 5FU; Diclofenac topical cream What is this medicine? FLUOROURACIL; DICLOFENAC (flure oh YOOR a sil; dye KLOE fen ak) is a combination of a topical chemotherapy agent and non-steroidal anti-inflammatory drug (NSAID). It is used on the skin to treat skin cancer and skin conditions that could become cancer. This medicine may be used for other purposes; ask your health care provider or pharmacist if you have questions. COMMON BRAND NAME(S): FLUORAC What should I tell my health care provider before I take this medicine? They need to know if you have any of these conditions: -bleeding problems -cigarette smoker -DPD enzyme deficiency -heart disease -high blood pressure -if you frequently drink alcohol containing drinks -kidney disease -liver disease -open or infected skin -stomach problems -swelling or open sores at the treatment site -recent or planned coronary artery bypass graft (CABG) surgery -an unusual or allergic reaction to fluorouracil, diclofenac, aspirin, other NSAIDs, other medicines, foods, dyes, or preservatives -pregnant or trying to get pregnant -breast-feeding How should I use this medicine? This medicine is only for use on the skin. Follow the directions on the prescription label. Wash hands before and after use. Wash affected area and gently pat dry. To apply this medicine use a cotton-tipped applicator, or use gloves if applying with fingertips. If applied with unprotected fingertips, it is very important to wash your hands well after you apply this medicine. Avoid applying to the eyes, nose, or mouth. Apply enough medicine to cover the affected area. You can cover the area with a light gauze dressing, but do not use tight or air-tight dressings. Finish the full course prescribed by your doctor or health care professional, even if you think your condition is better. Do not stop taking except on the advice of your doctor or health care professional. Talk to your  pediatrician regarding the use of this medicine in children. Special care may be needed. Overdosage: If you think you've taken too much of this medicine contact a poison control center or emergency room at once. Overdosage: If you think you have taken too much of this medicine contact a poison control center or emergency room at once. NOTE: This medicine is only for you. Do not share this medicine with others. What if I miss a dose? If you miss a dose, apply it as soon as you can. If it is almost time for your next dose, only use that dose. Do not apply extra doses. Contact your doctor or health care professional if you miss more than one dose. What may interact with this medicine? Interactions are not expected. Do not use any other skin products without telling your doctor or health care professional. This list may not describe all possible interactions. Give your health care provider a list of all the medicines, herbs, non-prescription drugs, or dietary supplements you use. Also tell them if you smoke, drink alcohol, or use illegal drugs. Some items may interact with your medicine. What should I watch for while using this medicine? Visit your doctor or health care professional for checks on your progress. You will need to use this medicine for 2 to 6 weeks. This may be longer depending on the condition being treated. You may not see full healing for another 1 to 2 months after you stop using the medicine. Treated areas of skin can look unsightly during and for several weeks after treatment with this medicine. This medicine can make you more sensitive to the sun. Keep out of the sun. If you cannot avoid being in   the sun, wear protective clothing and use sunscreen. Do not use sun lamps or tanning beds/booths. Where should I keep my What side effects may I notice from receiving this medicine? Side effects that you should report to your doctor or health care professional as soon as possible: -allergic  reactions like skin rash, itching or hives, swelling of the face, lips, or tongue -black or bloody stools, blood in the urine or vomit -blurred vision -chest pain -difficulty breathing or wheezing -redness, blistering, peeling or loosening of the skin, including inside the mouth -severe redness and swelling of normal skin -slurred speech or weakness on one side of the body -trouble passing urine or change in the amount of urine -unexplained weight gain or swelling -unusually weak or tired -yellowing of eyes or skin Side effects that usually do not require medical attention (Report these to your doctor or health care professional if they continue or are bothersome.): -increased sensitivity of the skin to sun and ultraviolet light -pain and burning of the affected area -scaling or swelling of the affected area -skin rash, itching of the affected area -tenderness This list may not describe all possible side effects. Call your doctor for medical advice about side effects. You may report side effects to FDA at 1-800-FDA-1088. Where should I keep my medicine? Keep out of the reach of children. Store at room temperature between 20 and 25 degrees C (68 and 77 degrees F). Throw away any unused medicine after the expiration date. NOTE: This sheet is a summary. It may not cover all possible information. If you have questions about this medicine, talk to your doctor, pharmacist, or health care provider.  2015, [employer]. (2014-03-12 11:09:58)  

## 2014-10-15 ENCOUNTER — Other Ambulatory Visit: Payer: Self-pay | Admitting: *Deleted

## 2014-10-15 ENCOUNTER — Telehealth: Payer: Self-pay | Admitting: *Deleted

## 2014-10-15 MED ORDER — MORPHINE SULFATE 15 MG PO TABS: 15.0000 mg | ORAL_TABLET | ORAL | Status: DC | PRN

## 2014-10-15 MED ORDER — ALPRAZOLAM 1 MG PO TABS: 1.0000 mg | ORAL_TABLET | Freq: Every evening | ORAL | Status: DC | PRN

## 2014-10-15 NOTE — Telephone Encounter (Signed)
Pt called reports "I called [facility] Sunday because I had a hard time urinating; then noticed some blood in my urine.  They told me to go to urgent care and they did a urine culture"  Pt also states that he has been pushing po fluids/drinking cranberry juice and "its better"  Pt denies fever; wanted to make Dr. Benay Spice aware and requesting re-fill of MSIR and Xanax.  Pt informed Dr. Benay Spice will be made aware and re-fill will be ready this afternoon for pick-up.  Pt verbalized understanding and confirmed he would call if symptoms get worse.

## 2014-10-19 ENCOUNTER — Other Ambulatory Visit: Payer: Self-pay | Admitting: Oncology

## 2014-10-22 ENCOUNTER — Other Ambulatory Visit: Payer: Self-pay | Admitting: *Deleted

## 2014-10-22 ENCOUNTER — Other Ambulatory Visit (HOSPITAL_BASED_OUTPATIENT_CLINIC_OR_DEPARTMENT_OTHER): Payer: BC Managed Care – PPO

## 2014-10-22 ENCOUNTER — Telehealth: Payer: Self-pay | Admitting: *Deleted

## 2014-10-22 DIAGNOSIS — R350 Frequency of micturition: Secondary | ICD-10-CM

## 2014-10-22 DIAGNOSIS — C250 Malignant neoplasm of head of pancreas: Secondary | ICD-10-CM

## 2014-10-22 LAB — URINALYSIS, MICROSCOPIC - CHCC
BLOOD: NEGATIVE
Bilirubin (Urine): NEGATIVE
GLUCOSE UR CHCC: NEGATIVE mg/dL
Ketones: NEGATIVE mg/dL
Leukocyte Esterase: NEGATIVE
NITRITE: NEGATIVE
PH: 6.5 (ref 4.6–8.0)
Protein: 30 mg/dL
Specific Gravity, Urine: 1.005 (ref 1.003–1.035)
Urobilinogen, UR: 0.2 mg/dL (ref 0.2–1)

## 2014-10-22 LAB — CBC WITH DIFFERENTIAL/PLATELET
BASO%: 0.6 % (ref 0.0–2.0)
BASOS ABS: 0 10*3/uL (ref 0.0–0.1)
EOS ABS: 0 10*3/uL (ref 0.0–0.5)
EOS%: 0.1 % (ref 0.0–7.0)
HCT: 37.4 % — ABNORMAL LOW (ref 38.4–49.9)
HEMOGLOBIN: 12.8 g/dL — AB (ref 13.0–17.1)
LYMPH%: 12 % — AB (ref 14.0–49.0)
MCH: 30.3 pg (ref 27.2–33.4)
MCHC: 34.3 g/dL (ref 32.0–36.0)
MCV: 88.3 fL (ref 79.3–98.0)
MONO#: 1.1 10*3/uL — ABNORMAL HIGH (ref 0.1–0.9)
MONO%: 13.2 % (ref 0.0–14.0)
NEUT%: 74.1 % (ref 39.0–75.0)
NEUTROS ABS: 6 10*3/uL (ref 1.5–6.5)
PLATELETS: 367 10*3/uL (ref 140–400)
RBC: 4.23 10*6/uL (ref 4.20–5.82)
RDW: 12.3 % (ref 11.0–14.6)
WBC: 8.1 10*3/uL (ref 4.0–10.3)
lymph#: 1 10*3/uL (ref 0.9–3.3)

## 2014-10-22 MED ORDER — PHENAZOPYRIDINE HCL 200 MG PO TABS: 200.0000 mg | ORAL_TABLET | Freq: Three times a day (TID) | ORAL | Status: DC

## 2014-10-22 NOTE — Telephone Encounter (Signed)
Patient here for lab, completed Walk-in form which reads "Hard time urinating very painful".  CBC wnl, UA shows few bacteria, few epithilial and moderate mucus.  Now white cells.  Dr. Benay Spice notified.  Patient also denies any low back pain or discomfort.  Verbal order received and read back for pyridium 200 mg tid.  Notified patient and notified him this will turn urine orange or red and to call if no relief.  [facility] with this order.

## 2014-10-22 NOTE — Telephone Encounter (Signed)
Called back and he confirms frequency, burning, occasional hematuria and trouble voiding, saying "it just trickles out". He will push fluids and come to office at 3 pm for CBC and U/A Culture.

## 2014-10-22 NOTE — Telephone Encounter (Signed)
Left VM reporting urinary frequency and hematuria.

## 2014-10-23 LAB — URINE CULTURE

## 2014-10-24 ENCOUNTER — Telehealth: Payer: Self-pay | Admitting: *Deleted

## 2014-10-24 ENCOUNTER — Telehealth: Payer: Self-pay | Admitting: Oncology

## 2014-10-24 ENCOUNTER — Ambulatory Visit: Payer: BC Managed Care – PPO

## 2014-10-24 ENCOUNTER — Ambulatory Visit (HOSPITAL_BASED_OUTPATIENT_CLINIC_OR_DEPARTMENT_OTHER): Payer: BC Managed Care – PPO | Admitting: Oncology

## 2014-10-24 ENCOUNTER — Other Ambulatory Visit (HOSPITAL_BASED_OUTPATIENT_CLINIC_OR_DEPARTMENT_OTHER): Payer: BC Managed Care – PPO

## 2014-10-24 VITALS — BP 91/63 | HR 113 | Resp 18 | Ht 69.0 in | Wt 161.4 lb

## 2014-10-24 DIAGNOSIS — G893 Neoplasm related pain (acute) (chronic): Secondary | ICD-10-CM

## 2014-10-24 DIAGNOSIS — C250 Malignant neoplasm of head of pancreas: Secondary | ICD-10-CM

## 2014-10-24 DIAGNOSIS — R350 Frequency of micturition: Secondary | ICD-10-CM

## 2014-10-24 LAB — CBC WITH DIFFERENTIAL/PLATELET
BASO%: 1.3 % (ref 0.0–2.0)
BASOS ABS: 0.1 10*3/uL (ref 0.0–0.1)
EOS ABS: 0.1 10*3/uL (ref 0.0–0.5)
EOS%: 0.7 % (ref 0.0–7.0)
HCT: 38.1 % — ABNORMAL LOW (ref 38.4–49.9)
HEMOGLOBIN: 12.9 g/dL — AB (ref 13.0–17.1)
LYMPH%: 35.3 % (ref 14.0–49.0)
MCH: 30.2 pg (ref 27.2–33.4)
MCHC: 33.7 g/dL (ref 32.0–36.0)
MCV: 89.5 fL (ref 79.3–98.0)
MONO#: 1.4 10*3/uL — ABNORMAL HIGH (ref 0.1–0.9)
MONO%: 18.7 % — ABNORMAL HIGH (ref 0.0–14.0)
NEUT%: 44 % (ref 39.0–75.0)
NEUTROS ABS: 3.4 10*3/uL (ref 1.5–6.5)
Platelets: 370 10*3/uL (ref 140–400)
RBC: 4.26 10*6/uL (ref 4.20–5.82)
RDW: 12.4 % (ref 11.0–14.6)
WBC: 7.8 10*3/uL (ref 4.0–10.3)
lymph#: 2.7 10*3/uL (ref 0.9–3.3)

## 2014-10-24 LAB — COMPREHENSIVE METABOLIC PANEL (CC13)
ALBUMIN: 3.6 g/dL (ref 3.5–5.0)
ALT: 39 U/L (ref 0–55)
AST: 20 U/L (ref 5–34)
Alkaline Phosphatase: 88 U/L (ref 40–150)
Anion Gap: 13 mEq/L — ABNORMAL HIGH (ref 3–11)
BUN: 12.7 mg/dL (ref 7.0–26.0)
CHLORIDE: 102 meq/L (ref 98–109)
CO2: 26 mEq/L (ref 22–29)
Calcium: 9.9 mg/dL (ref 8.4–10.4)
Creatinine: 1.1 mg/dL (ref 0.7–1.3)
Glucose: 101 mg/dl (ref 70–140)
Potassium: 4.1 mEq/L (ref 3.5–5.1)
SODIUM: 141 meq/L (ref 136–145)
TOTAL PROTEIN: 6.9 g/dL (ref 6.4–8.3)
Total Bilirubin: 0.44 mg/dL (ref 0.20–1.20)

## 2014-10-24 MED ORDER — TAMSULOSIN HCL 0.4 MG PO CAPS: 0.4000 mg | ORAL_CAPSULE | Freq: Every day | ORAL | Status: DC

## 2014-10-24 NOTE — Progress Notes (Signed)
  Richard Cantu OFFICE PROGRESS NOTE   Diagnosis: Pancreas cancer  INTERVAL HISTORY:   He completed another cycle FOLFIRINOX 10/10/2014. He reports much less nausea following this cycle of chemotherapy. The abdominal pain is improved. He continues MS Contin and on some days does not take breakthrough pain medication. He continues to have anorexia. He reports tingling in: Sensitivity in the fingers for one week following chemotherapy.  History of complaint is dysuria. He reports difficulty urinating beginning approximately 10/14/2014. He states that if he does not urinate immediately when feeling the urge he cannot start the urinary stream. He had 2 episodes of gross hematuria. Urination is painful. This has improved since he started Pyridium 2 days ago. He has no previous history of prostate issues. He has noted a fullness at the left testicle.  Objective:  Vital signs in last 24 hours:  Blood pressure 91/63, pulse 113, temperature 0 F (-17.8 C), resp. rate 18, height 5\' 9"  (1.753 m), weight 161 lb 6.4 oz (73.211 kg).    HEENT: No thrush or ulcers Resp: Lungs clear bilaterally Cardio: Regular rate and rhythm GI: No hepatomegaly, nontender, no mass Vascular: No leg edema Neurologic: Very mild decrease in vibratory sense at the fingertips bilaterally GU: Testes without mass, the bladder is not distended    Portacath/PICC-without erythema  Lab Results:  Lab Results  Component Value Date   WBC 7.8 10/24/2014   HGB 12.9* 10/24/2014   HCT 38.1* 10/24/2014   MCV 89.5 10/24/2014   PLT 370 10/24/2014   NEUTROABS 3.4 10/24/2014    10/22/2014-urinalysis: 0-2 white cells, 0-2 red cells, few bacteria, urine culture negative    Medications: I have reviewed the patient's current medications.  Assessment/Plan: 1. 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 Pain secondary to #1, improved   3. psoriasis    4. Acute nausea and vomiting with cycle 1 FOLFIRINOX-emend and prophylactic Decadron were added with cycle 2, improved       5. Urinary hesitancy-this is most likely related to narcotic analgesics, chemotherapy, and prostatic hypertrophy. 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       6. Anorexia/weight loss secondary to #1    Disposition:  Richard Cantu has completed 2 cycles of FOLFIRINOX. The abdominal pain has improved significantly. He continues to have anorexia and weight loss. He reports his appetite is improved today.  He has developed urinary hesitancy. I discussed the case with urology. He will begin a trial of Flomax. If this does not improve the urinary symptoms we will make a urology referral.  We decided to hold chemotherapy for 1 week secondary to the weight loss and urinary issues.  He will return for an office visit and FOLFIRINOX in one week.  Betsy Coder, MD  10/24/2014  2:13 PM

## 2014-10-24 NOTE — Telephone Encounter (Signed)
Per staff message and POF I have scheduled appts. Advised scheduler of appts. JMW  

## 2014-10-24 NOTE — Telephone Encounter (Signed)
Gave avs & cal for Dec. Sent mess to r/s Tx to 10/31/14.

## 2014-10-24 NOTE — Telephone Encounter (Signed)
Called pt with Flomax instructions. Take one PO daily. Instructed him to push PO fluids. Be aware of hypotension and to change positions slowly. He voiced understanding.

## 2014-10-25 ENCOUNTER — Telehealth: Payer: Self-pay | Admitting: *Deleted

## 2014-10-25 LAB — CANCER ANTIGEN 19-9: CA 19 9: 55.5 U/mL — AB (ref <35.0)

## 2014-10-25 NOTE — Telephone Encounter (Deleted)
-----   Message from Ladell Pier, MD sent at 10/25/2014  7:05 AM EST ----- Please call patient , ca19-9 is better

## 2014-10-25 NOTE — Telephone Encounter (Signed)
-----   Message from Ladell Pier, MD sent at 10/25/2014  7:05 AM EST ----- Please call patient , ca19-9 is better

## 2014-10-25 NOTE — Telephone Encounter (Signed)
Called pt with CA19-9 result. Better, per Dr. Benay Spice. He voiced understanding.

## 2014-10-31 ENCOUNTER — Other Ambulatory Visit (HOSPITAL_BASED_OUTPATIENT_CLINIC_OR_DEPARTMENT_OTHER): Payer: BC Managed Care – PPO

## 2014-10-31 ENCOUNTER — Ambulatory Visit: Payer: BC Managed Care – PPO | Admitting: Nutrition

## 2014-10-31 ENCOUNTER — Ambulatory Visit (HOSPITAL_BASED_OUTPATIENT_CLINIC_OR_DEPARTMENT_OTHER): Payer: BC Managed Care – PPO

## 2014-10-31 ENCOUNTER — Telehealth: Payer: Self-pay | Admitting: Nurse Practitioner

## 2014-10-31 ENCOUNTER — Ambulatory Visit (HOSPITAL_BASED_OUTPATIENT_CLINIC_OR_DEPARTMENT_OTHER): Payer: BC Managed Care – PPO | Admitting: Nurse Practitioner

## 2014-10-31 VITALS — BP 63/47 | HR 107 | Temp 97.4°F | Resp 18 | Ht 69.0 in | Wt 163.6 lb

## 2014-10-31 DIAGNOSIS — C250 Malignant neoplasm of head of pancreas: Secondary | ICD-10-CM

## 2014-10-31 DIAGNOSIS — Z5111 Encounter for antineoplastic chemotherapy: Secondary | ICD-10-CM

## 2014-10-31 DIAGNOSIS — I951 Orthostatic hypotension: Secondary | ICD-10-CM

## 2014-10-31 LAB — COMPREHENSIVE METABOLIC PANEL (CC13)
ALBUMIN: 3.5 g/dL (ref 3.5–5.0)
ALT: 28 U/L (ref 0–55)
AST: 15 U/L (ref 5–34)
Alkaline Phosphatase: 84 U/L (ref 40–150)
Anion Gap: 11 mEq/L (ref 3–11)
BILIRUBIN TOTAL: 0.61 mg/dL (ref 0.20–1.20)
BUN: 16.3 mg/dL (ref 7.0–26.0)
CO2: 27 mEq/L (ref 22–29)
Calcium: 10.1 mg/dL (ref 8.4–10.4)
Chloride: 102 mEq/L (ref 98–109)
Creatinine: 1 mg/dL (ref 0.7–1.3)
EGFR: 83 mL/min/{1.73_m2} — ABNORMAL LOW (ref >90)
GLUCOSE: 131 mg/dL (ref 70–140)
POTASSIUM: 5 meq/L (ref 3.5–5.1)
SODIUM: 140 meq/L (ref 136–145)
TOTAL PROTEIN: 6.8 g/dL (ref 6.4–8.3)

## 2014-10-31 LAB — CBC WITH DIFFERENTIAL/PLATELET
BASO%: 0.6 % (ref 0.0–2.0)
Basophils Absolute: 0.1 10*3/uL (ref 0.0–0.1)
EOS ABS: 0.3 10*3/uL (ref 0.0–0.5)
EOS%: 2.9 % (ref 0.0–7.0)
HCT: 34.9 % — ABNORMAL LOW (ref 38.4–49.9)
HGB: 11.7 g/dL — ABNORMAL LOW (ref 13.0–17.1)
LYMPH%: 19.4 % (ref 14.0–49.0)
MCH: 30.2 pg (ref 27.2–33.4)
MCHC: 33.5 g/dL (ref 32.0–36.0)
MCV: 90.2 fL (ref 79.3–98.0)
MONO#: 1.4 10*3/uL — ABNORMAL HIGH (ref 0.1–0.9)
MONO%: 13.7 % (ref 0.0–14.0)
NEUT%: 63.4 % (ref 39.0–75.0)
NEUTROS ABS: 6.6 10*3/uL — AB (ref 1.5–6.5)
Platelets: 337 10*3/uL (ref 140–400)
RBC: 3.87 10*6/uL — AB (ref 4.20–5.82)
RDW: 13.1 % (ref 11.0–14.6)
WBC: 10.4 10*3/uL — ABNORMAL HIGH (ref 4.0–10.3)
lymph#: 2 10*3/uL (ref 0.9–3.3)

## 2014-10-31 MED ORDER — ATROPINE SULFATE 1 MG/ML IJ SOLN
0.5000 mg | Freq: Once | INTRAMUSCULAR | Status: AC | PRN
  Administered 2014-10-31: 0.5 mg via INTRAVENOUS

## 2014-10-31 MED ORDER — ATROPINE SULFATE 1 MG/ML IJ SOLN
INTRAMUSCULAR | Status: AC
Start: 2014-10-31 — End: 2014-10-31
  Filled 2014-10-31: qty 1

## 2014-10-31 MED ORDER — DEXAMETHASONE SODIUM PHOSPHATE 20 MG/5ML IJ SOLN
INTRAMUSCULAR | Status: AC
  Filled 2014-10-31: qty 5

## 2014-10-31 MED ORDER — DEXTROSE 5 % IV SOLN
85.0000 mg/m2 | Freq: Once | INTRAVENOUS | Status: AC
  Administered 2014-10-31: 170 mg via INTRAVENOUS
  Filled 2014-10-31: qty 34

## 2014-10-31 MED ORDER — LEUCOVORIN CALCIUM INJECTION 350 MG
400.0000 mg/m2 | Freq: Once | INTRAMUSCULAR | Status: AC
  Administered 2014-10-31: 796 mg via INTRAVENOUS
  Filled 2014-10-31: qty 39.8

## 2014-10-31 MED ORDER — PALONOSETRON HCL INJECTION 0.25 MG/5ML
INTRAVENOUS | Status: AC
  Filled 2014-10-31: qty 5

## 2014-10-31 MED ORDER — DEXTROSE 5 % IV SOLN
Freq: Once | INTRAVENOUS | Status: AC
  Administered 2014-10-31: 12:00:00 via INTRAVENOUS

## 2014-10-31 MED ORDER — PALONOSETRON HCL INJECTION 0.25 MG/5ML
0.2500 mg | Freq: Once | INTRAVENOUS | Status: AC
  Administered 2014-10-31: 0.25 mg via INTRAVENOUS

## 2014-10-31 MED ORDER — FOSAPREPITANT DIMEGLUMINE INJECTION 150 MG
150.0000 mg | Freq: Once | INTRAVENOUS | Status: AC
  Administered 2014-10-31: 150 mg via INTRAVENOUS
  Filled 2014-10-31: qty 5

## 2014-10-31 MED ORDER — SODIUM CHLORIDE 0.9 % IV SOLN
500.0000 mL | Freq: Once | INTRAVENOUS | Status: AC
  Administered 2014-10-31: 500 mL via INTRAVENOUS

## 2014-10-31 MED ORDER — DEXAMETHASONE SODIUM PHOSPHATE 20 MG/5ML IJ SOLN
12.0000 mg | Freq: Once | INTRAMUSCULAR | Status: AC
  Administered 2014-10-31: 12 mg via INTRAVENOUS

## 2014-10-31 MED ORDER — FLUOROURACIL CHEMO INJECTION 5 GM/100ML
2400.0000 mg/m2 | INTRAVENOUS | Status: DC
  Administered 2014-10-31: 4800 mg via INTRAVENOUS
  Filled 2014-10-31: qty 96

## 2014-10-31 MED ORDER — IRINOTECAN HCL CHEMO INJECTION 100 MG/5ML
181.0000 mg/m2 | Freq: Once | INTRAVENOUS | Status: AC
  Administered 2014-10-31: 360 mg via INTRAVENOUS
  Filled 2014-10-31: qty 18

## 2014-10-31 MED ORDER — SODIUM CHLORIDE 0.9 % IV SOLN: 1000.0000 mL | Freq: Once | INTRAVENOUS | Status: DC

## 2014-10-31 MED ORDER — MORPHINE SULFATE 15 MG PO TABS: 15.0000 mg | ORAL_TABLET | ORAL | Status: DC | PRN

## 2014-10-31 NOTE — Progress Notes (Addendum)
  [place] OFFICE PROGRESS NOTE   Diagnosis:  Pancreas cancer  INTERVAL HISTORY:   He returns as scheduled. The urinary symptoms are significantly better since beginning Flomax. He reports a good urine stream. No hematuria. He notes improvement in his appetite and weight gain. He denies nausea/vomiting. No mouth sores. No diarrhea. No shortness of breath or chest pain. Abdominal pain continues to be improved. He continues MS Contin and infrequently takes MSIR. He notes dizziness with standing. He reports good fluid intake.  Objective:  Vital signs in last 24 hours:  Blood pressure 77/52, pulse 113, temperature 97.4 F (36.3 C), temperature source Oral, resp. rate 18, height 5\' 9"  (1.753 m), weight 163 lb 9.6 oz (74.208 kg), SpO2 100 %.    HEENT: no thrush or ulcers. Mucous membranes are moist. Resp: lungs clear bilaterally. Cardio: regular rate and rhythm. GI: abdomen soft and nontender. No hepatomegaly. No mass. Vascular: no leg edema.  Skin: normal skin turgor. Port-A-Cath without erythema.    Lab Results:  Lab Results  Component Value Date   WBC 10.4* 10/31/2014   HGB 11.7* 10/31/2014   HCT 34.9* 10/31/2014   MCV 90.2 10/31/2014   PLT 337 10/31/2014   NEUTROABS 6.6* 10/31/2014    Imaging:  No results found.  Medications: I have reviewed the patient's current medications.  Assessment/Plan: 1. 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  Cycle 3 FOLFIRINOX 10/31/2014.  2. Pain secondary to #1, improved   3. Psoriasis    4. Acute nausea and vomiting with cycle 1 FOLFIRINOX-emend and prophylactic Decadron were added with cycle 2, improved.   5. Urinary hesitancy-this is most likely related to narcotic analgesics, chemotherapy, and prostatic hypertrophy. 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 Improved with Flomax.  6. Anorexia/weight loss secondary to #1       7. Orthostatic hypotension. 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   Disposition: Mr. Heatwole has completed 2 cycles of FOLFIRINOX. The CA 19-9 was better on 10/24/2014 and his pain is significantly improved. Plan to proceed with cycle 3 FOLFIRINOX today as scheduled.  He is noted to be orthostatic on vital signs today. This may be related to Flomax, dehydration or possibly autonomic neuropathy related to oxaliplatin. He was instructed on slow position changes. He will obtain support stockings. We will give additional IV fluids today and repeat the vital signs. We will also obtain repeat vital signs when he returns for the pump discontinuation on 11/02/2014.  He will return for a followup visit and cycle 4 FOLFIRINOX on 11/13/2014. He will contact the office in the interim with any problems.  Patient seen with Dr. Benay Spice. 25 minutes were spent face-to-face at today's visit with the majority of the time involved in counseling/coordination of care.    Ned Card ANP/GNP-BC   10/31/2014  10:38 AM  This was a shared visit with Ned Card. His performance status has improved. 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 He does not appear dehydrated. The plan is to proceed with cycle 3 FOLFIRINOX today.  Theodosia Paling, M.D.

## 2014-10-31 NOTE — Telephone Encounter (Signed)
Pt confirmed labs/ov per 12/09 POF, gave pt AVS.... KJ, sent msg to add chemo

## 2014-10-31 NOTE — Patient Instructions (Signed)
[place] Discharge Instructions for Patients Receiving Chemotherapy  Today you received the following chemotherapy agents oxaliplatin/leucovorin/irinotecan/fluorouracil  To help prevent nausea and vomiting after your treatment, we encourage you to take your nausea medication as directed   If you develop nausea and vomiting that is not controlled by your nausea medication, call the clinic.   BELOW ARE SYMPTOMS THAT SHOULD BE REPORTED IMMEDIATELY:  *FEVER GREATER THAN 100.5 F  *CHILLS WITH OR WITHOUT FEVER  NAUSEA AND VOMITING THAT IS NOT CONTROLLED WITH YOUR NAUSEA MEDICATION  *UNUSUAL SHORTNESS OF BREATH  *UNUSUAL BRUISING OR BLEEDING  TENDERNESS IN MOUTH AND THROAT WITH OR WITHOUT PRESENCE OF ULCERS  *URINARY PROBLEMS  *BOWEL PROBLEMS  UNUSUAL RASH Items with * indicate a potential emergency and should be followed up as soon as possible.  Feel free to call the clinic you have any questions or concerns. The clinic phone number is [phone].

## 2014-10-31 NOTE — Progress Notes (Signed)
58 year old male diagnosed with cancer of the pancreas.  He is a patient of Dr. Benay Spice.  Past medical history includes chronic pancreatitis, gout, anxiety, hypertension, and GERD.  Medications include Xanax, Decadron, Creon, Zofran, Protonix, MiraLax, Compazine, and Senokot.  Labs were reviewed.  Height: 69 inches. Weight: 163.6 pounds December 9. Usual body weight: 193 pounds September 2015. BMI: 24.15.  Patient reports increased fatigue. He complains of taste alterations.  Food tastes bland.  Patient denies mouth sores. Patient did experience nausea and vomiting after first chemotherapy.  This has since improved. Patient is drinking, high-protein boost 3-4 times daily.  Nutrition diagnosis: Unintentional weight loss related to pancreas cancer and associated treatments as evidenced by 15% weight loss in 3 months.  Intervention: Patient educated to consume small, frequent meals and snacks. Recommended high-calorie, high-protein foods.  Provided a fact sheet on increasing calories and protein. Recommended patient change high-protein boost to boost plus or Ensure Plus for an additional 100 calories per serving. Provided coupons. Educated patient on strategies for dealing with taste alterations.  Provided fact sheets. Questions were answered and teach back method used.  Monitoring, evaluation, goals: Patient will tolerate increased oral intake along with boost +4 times a day to promote weight maintenance/gain.    Next visit: To be scheduled.  **Disclaimer: This note was dictated with voice recognition software. Similar sounding words can inadvertently be transcribed and this note may contain transcription errors which may not have been corrected upon publication of note.**

## 2014-10-31 NOTE — Telephone Encounter (Signed)
Per 12/09 POF sent msg to add IV Iron

## 2014-11-02 ENCOUNTER — Ambulatory Visit (HOSPITAL_BASED_OUTPATIENT_CLINIC_OR_DEPARTMENT_OTHER): Payer: BC Managed Care – PPO

## 2014-11-02 DIAGNOSIS — C250 Malignant neoplasm of head of pancreas: Secondary | ICD-10-CM

## 2014-11-02 MED ORDER — SODIUM CHLORIDE 0.9 % IJ SOLN
10.0000 mL | INTRAMUSCULAR | Status: DC | PRN
  Administered 2014-11-02: 10 mL
  Filled 2014-11-02: qty 10

## 2014-11-02 MED ORDER — HEPARIN SOD (PORK) LOCK FLUSH 100 UNIT/ML IV SOLN
500.0000 [IU] | Freq: Once | INTRAVENOUS | Status: AC | PRN
  Administered 2014-11-02: 500 [IU]
  Filled 2014-11-02: qty 5

## 2014-11-02 NOTE — Progress Notes (Signed)
Orthostatic vitals taken on pt. Lying 110/68 heart rate 88.  Sitting 97/64 heart rate 96.  Standing up 87/55 heart rate 108.  Pt states that he feels slightly dizzy when sitting up from a lying down position.  Per Ned Card, NP pt needs to wear his compression stockings and be mindful of position changes.  Pt verbalizes understanding.

## 2014-11-02 NOTE — Patient Instructions (Signed)
Fluorouracil, 5FU; Diclofenac topical cream What is this medicine? FLUOROURACIL; DICLOFENAC (flure oh YOOR a sil; dye KLOE fen ak) is a combination of a topical chemotherapy agent and non-steroidal anti-inflammatory drug (NSAID). It is used on the skin to treat skin cancer and skin conditions that could become cancer. This medicine may be used for other purposes; ask your health care provider or pharmacist if you have questions. COMMON BRAND NAME(S): FLUORAC What should I tell my health care provider before I take this medicine? They need to know if you have any of these conditions: -bleeding problems -cigarette smoker -DPD enzyme deficiency -heart disease -high blood pressure -if you frequently drink alcohol containing drinks -kidney disease -liver disease -open or infected skin -stomach problems -swelling or open sores at the treatment site -recent or planned coronary artery bypass graft (CABG) surgery -an unusual or allergic reaction to fluorouracil, diclofenac, aspirin, other NSAIDs, other medicines, foods, dyes, or preservatives -pregnant or trying to get pregnant -breast-feeding How should I use this medicine? This medicine is only for use on the skin. Follow the directions on the prescription label. Wash hands before and after use. Wash affected area and gently pat dry. To apply this medicine use a cotton-tipped applicator, or use gloves if applying with fingertips. If applied with unprotected fingertips, it is very important to wash your hands well after you apply this medicine. Avoid applying to the eyes, nose, or mouth. Apply enough medicine to cover the affected area. You can cover the area with a light gauze dressing, but do not use tight or air-tight dressings. Finish the full course prescribed by your doctor or health care professional, even if you think your condition is better. Do not stop taking except on the advice of your doctor or health care professional. Talk to your  pediatrician regarding the use of this medicine in children. Special care may be needed. Overdosage: If you think you've taken too much of this medicine contact a poison control center or emergency room at once. Overdosage: If you think you have taken too much of this medicine contact a poison control center or emergency room at once. NOTE: This medicine is only for you. Do not share this medicine with others. What if I miss a dose? If you miss a dose, apply it as soon as you can. If it is almost time for your next dose, only use that dose. Do not apply extra doses. Contact your doctor or health care professional if you miss more than one dose. What may interact with this medicine? Interactions are not expected. Do not use any other skin products without telling your doctor or health care professional. This list may not describe all possible interactions. Give your health care provider a list of all the medicines, herbs, non-prescription drugs, or dietary supplements you use. Also tell them if you smoke, drink alcohol, or use illegal drugs. Some items may interact with your medicine. What should I watch for while using this medicine? Visit your doctor or health care professional for checks on your progress. You will need to use this medicine for 2 to 6 weeks. This may be longer depending on the condition being treated. You may not see full healing for another 1 to 2 months after you stop using the medicine. Treated areas of skin can look unsightly during and for several weeks after treatment with this medicine. This medicine can make you more sensitive to the sun. Keep out of the sun. If you cannot avoid being in   the sun, wear protective clothing and use sunscreen. Do not use sun lamps or tanning beds/booths. Where should I keep my What side effects may I notice from receiving this medicine? Side effects that you should report to your doctor or health care professional as soon as possible: -allergic  reactions like skin rash, itching or hives, swelling of the face, lips, or tongue -black or bloody stools, blood in the urine or vomit -blurred vision -chest pain -difficulty breathing or wheezing -redness, blistering, peeling or loosening of the skin, including inside the mouth -severe redness and swelling of normal skin -slurred speech or weakness on one side of the body -trouble passing urine or change in the amount of urine -unexplained weight gain or swelling -unusually weak or tired -yellowing of eyes or skin Side effects that usually do not require medical attention (Report these to your doctor or health care professional if they continue or are bothersome.): -increased sensitivity of the skin to sun and ultraviolet light -pain and burning of the affected area -scaling or swelling of the affected area -skin rash, itching of the affected area -tenderness This list may not describe all possible side effects. Call your doctor for medical advice about side effects. You may report side effects to FDA at 1-800-FDA-1088. Where should I keep my medicine? Keep out of the reach of children. Store at room temperature between 20 and 25 degrees C (68 and 77 degrees F). Throw away any unused medicine after the expiration date. NOTE: This sheet is a summary. It may not cover all possible information. If you have questions about this medicine, talk to your doctor, pharmacist, or health care provider.  2015, [employer]. (2014-03-12 11:09:58)  

## 2014-11-05 ENCOUNTER — Other Ambulatory Visit: Payer: Self-pay | Admitting: *Deleted

## 2014-11-05 MED ORDER — MORPHINE SULFATE ER 60 MG PO TBCR: 60.0000 mg | EXTENDED_RELEASE_TABLET | Freq: Two times a day (BID) | ORAL | Status: DC

## 2014-11-05 NOTE — Telephone Encounter (Signed)
Notified pt that pain re-fill request for MS Contin is ready for p/u.  Pt verbalized understanding and expressed appreciation for call back.

## 2014-11-07 ENCOUNTER — Encounter: Payer: BC Managed Care – PPO | Admitting: Nutrition

## 2014-11-07 ENCOUNTER — Ambulatory Visit: Payer: BC Managed Care – PPO | Admitting: Nurse Practitioner

## 2014-11-07 ENCOUNTER — Ambulatory Visit: Payer: BC Managed Care – PPO

## 2014-11-07 ENCOUNTER — Other Ambulatory Visit: Payer: BC Managed Care – PPO

## 2014-11-11 ENCOUNTER — Other Ambulatory Visit: Payer: Self-pay | Admitting: Oncology

## 2014-11-13 ENCOUNTER — Telehealth: Payer: Self-pay | Admitting: Oncology

## 2014-11-13 ENCOUNTER — Ambulatory Visit (HOSPITAL_BASED_OUTPATIENT_CLINIC_OR_DEPARTMENT_OTHER): Payer: BC Managed Care – PPO | Admitting: Nurse Practitioner

## 2014-11-13 ENCOUNTER — Ambulatory Visit: Payer: BC Managed Care – PPO

## 2014-11-13 ENCOUNTER — Ambulatory Visit (HOSPITAL_BASED_OUTPATIENT_CLINIC_OR_DEPARTMENT_OTHER): Payer: BC Managed Care – PPO | Admitting: Lab

## 2014-11-13 ENCOUNTER — Ambulatory Visit (HOSPITAL_BASED_OUTPATIENT_CLINIC_OR_DEPARTMENT_OTHER): Payer: BC Managed Care – PPO

## 2014-11-13 DIAGNOSIS — C250 Malignant neoplasm of head of pancreas: Secondary | ICD-10-CM

## 2014-11-13 DIAGNOSIS — Z5111 Encounter for antineoplastic chemotherapy: Secondary | ICD-10-CM

## 2014-11-13 LAB — COMPREHENSIVE METABOLIC PANEL (CC13)
ALT: 25 U/L (ref 0–55)
AST: 16 U/L (ref 5–34)
Albumin: 3.5 g/dL (ref 3.5–5.0)
Alkaline Phosphatase: 86 U/L (ref 40–150)
Anion Gap: 9 mEq/L (ref 3–11)
BILIRUBIN TOTAL: 0.52 mg/dL (ref 0.20–1.20)
BUN: 16.8 mg/dL (ref 7.0–26.0)
CO2: 28 mEq/L (ref 22–29)
Calcium: 9.8 mg/dL (ref 8.4–10.4)
Chloride: 101 mEq/L (ref 98–109)
Creatinine: 0.8 mg/dL (ref 0.7–1.3)
EGFR: >90 mL/min/{1.73_m2} (ref >90)
Glucose: 95 mg/dl (ref 70–140)
Potassium: 4.8 mEq/L (ref 3.5–5.1)
Sodium: 139 mEq/L (ref 136–145)
Total Protein: 6.7 g/dL (ref 6.4–8.3)

## 2014-11-13 LAB — CBC WITH DIFFERENTIAL/PLATELET
BASO%: 0.5 % (ref 0.0–2.0)
Basophils Absolute: 0 10*3/uL (ref 0.0–0.1)
EOS ABS: 0.1 10*3/uL (ref 0.0–0.5)
EOS%: 1.8 % (ref 0.0–7.0)
HCT: 32.9 % — ABNORMAL LOW (ref 38.4–49.9)
HEMOGLOBIN: 11 g/dL — AB (ref 13.0–17.1)
LYMPH%: 21.6 % (ref 14.0–49.0)
MCH: 30 pg (ref 27.2–33.4)
MCHC: 33.4 g/dL (ref 32.0–36.0)
MCV: 89.6 fL (ref 79.3–98.0)
MONO#: 1.1 10*3/uL — ABNORMAL HIGH (ref 0.1–0.9)
MONO%: 16.7 % — ABNORMAL HIGH (ref 0.0–14.0)
NEUT#: 4 10*3/uL (ref 1.5–6.5)
NEUT%: 59.4 % (ref 39.0–75.0)
Platelets: 206 10*3/uL (ref 140–400)
RBC: 3.67 10*6/uL — AB (ref 4.20–5.82)
RDW: 13.8 % (ref 11.0–14.6)
WBC: 6.7 10*3/uL (ref 4.0–10.3)
lymph#: 1.4 10*3/uL (ref 0.9–3.3)

## 2014-11-13 MED ORDER — SODIUM CHLORIDE 0.9 % IV SOLN
2400.0000 mg/m2 | INTRAVENOUS | Status: DC
  Administered 2014-11-13: 4800 mg via INTRAVENOUS
  Filled 2014-11-13: qty 96

## 2014-11-13 MED ORDER — ATROPINE SULFATE 1 MG/ML IJ SOLN
0.5000 mg | Freq: Once | INTRAMUSCULAR | Status: AC | PRN
  Administered 2014-11-13: 0.5 mg via INTRAVENOUS

## 2014-11-13 MED ORDER — SODIUM CHLORIDE 0.9 % IV SOLN
150.0000 mg | Freq: Once | INTRAVENOUS | Status: AC
  Administered 2014-11-13: 150 mg via INTRAVENOUS
  Filled 2014-11-13: qty 5

## 2014-11-13 MED ORDER — DEXAMETHASONE SODIUM PHOSPHATE 20 MG/5ML IJ SOLN
12.0000 mg | Freq: Once | INTRAMUSCULAR | Status: AC
  Administered 2014-11-13: 12 mg via INTRAVENOUS

## 2014-11-13 MED ORDER — IRINOTECAN HCL CHEMO INJECTION 100 MG/5ML
180.0000 mg/m2 | Freq: Once | INTRAVENOUS | Status: AC
  Administered 2014-11-13: 358 mg via INTRAVENOUS
  Filled 2014-11-13: qty 17.9

## 2014-11-13 MED ORDER — DEXTROSE 5 % IV SOLN
Freq: Once | INTRAVENOUS | Status: AC
  Administered 2014-11-13: 13:00:00 via INTRAVENOUS

## 2014-11-13 MED ORDER — PALONOSETRON HCL INJECTION 0.25 MG/5ML
INTRAVENOUS | Status: AC
  Filled 2014-11-13: qty 5

## 2014-11-13 MED ORDER — ATROPINE SULFATE 1 MG/ML IJ SOLN
INTRAMUSCULAR | Status: AC
  Filled 2014-11-13: qty 1

## 2014-11-13 MED ORDER — DEXAMETHASONE SODIUM PHOSPHATE 20 MG/5ML IJ SOLN
INTRAMUSCULAR | Status: AC
  Filled 2014-11-13: qty 5

## 2014-11-13 MED ORDER — MORPHINE SULFATE 15 MG PO TABS: 15.0000 mg | ORAL_TABLET | ORAL | Status: DC | PRN

## 2014-11-13 MED ORDER — LEUCOVORIN CALCIUM INJECTION 350 MG
400.0000 mg/m2 | Freq: Once | INTRAVENOUS | Status: AC
  Administered 2014-11-13: 796 mg via INTRAVENOUS
  Filled 2014-11-13: qty 39.8

## 2014-11-13 MED ORDER — PALONOSETRON HCL INJECTION 0.25 MG/5ML
0.2500 mg | Freq: Once | INTRAVENOUS | Status: AC
  Administered 2014-11-13: 0.25 mg via INTRAVENOUS

## 2014-11-13 MED ORDER — OXALIPLATIN CHEMO INJECTION 100 MG/20ML
85.0000 mg/m2 | Freq: Once | INTRAVENOUS | Status: AC
  Administered 2014-11-13: 170 mg via INTRAVENOUS
  Filled 2014-11-13: qty 34

## 2014-11-13 NOTE — Telephone Encounter (Signed)
gv and printd appt sched and avs for pt for DEC and Jan 2016...sed added tx....emailed MW to help add on 1.5

## 2014-11-13 NOTE — Progress Notes (Signed)
  [facility] OFFICE PROGRESS NOTE   Diagnosis:  Pancreas cancer  INTERVAL HISTORY:   He returns as scheduled. He completed cycle 3 FOLFIRINOX 10/31/2014. He denies nausea/vomiting. No masses. No diarrhea. No numbness or tingling in his hands or feet. Pain is well-controlled with MS Contin. He occasionally takes MSIR. He reports a good appetite.  Objective:  Vital signs in last 24 hours:  There were no vitals taken for this visit.    HEENT: No thrush or ulcers. Resp: Lungs clear bilaterally. Cardio: Regular rate and rhythm. GI: Abdomen soft and nontender. No organomegaly. Vascular: No leg edema. Soft and nontender. Neuro: Vibratory sense intact to mildly decreased over the fingertips per tuning fork exam.  Port-A-Cath without erythema.  Lab Results:  Lab Results  Component Value Date   WBC 6.7 11/13/2014   HGB 11.0* 11/13/2014   HCT 32.9* 11/13/2014   MCV 89.6 11/13/2014   PLT 206 11/13/2014   NEUTROABS 4.0 11/13/2014    Imaging:  No results found.  Medications: I have reviewed the patient's current medications.  Assessment/Plan: Adenocarcinoma the pancreas, no chronic pancreas head/uncinate mass, status post an FNA biopsy 09/04/2014 confirming adenocarcinoma.  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  Cycle 3 FOLFIRINOX 10/31/2014.  Cycle 4 FOLFIRINOX 11/13/2014.  2. Pain secondary to #1, improved   3. Psoriasis    4. Acute nausea and vomiting with cycle 1 FOLFIRINOX-emend and prophylactic Decadron were added with cycle 2, improved.  5. Urinary hesitancy-this is most likely related to narcotic analgesics, chemotherapy, and prostatic hypertrophy. 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 Improved with Flomax.  6. Anorexia/weight loss secondary  to #1  7. Orthostatic hypotension. 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   Disposition: He appears stable. He has completed 3 cycles of FOLFIRINOX. Plan to proceed with cycle 4 today as scheduled. He will return for a follow-up visit and cycle 5 in 2 weeks. He will contact the office in the interim with any problems.  Restaging CT evaluation planned after 5 cycles.  Plan reviewed with Dr. Benay Spice.  Ned Card ANP/GNP-BC   11/13/2014  11:45 AM

## 2014-11-13 NOTE — Patient Instructions (Signed)
[facility] Discharge Instructions for Patients Receiving Chemotherapy  Today you received the following chemotherapy agents:  Oxaliplatin, Leucovorin, Camptosar and 5FU  To help prevent nausea and vomiting after your treatment, we encourage you to take your nausea medication as ordered per MD.   If you develop nausea and vomiting that is not controlled by your nausea medication, call the clinic.   BELOW ARE SYMPTOMS THAT SHOULD BE REPORTED IMMEDIATELY:  *FEVER GREATER THAN 100.5 F  *CHILLS WITH OR WITHOUT FEVER  NAUSEA AND VOMITING THAT IS NOT CONTROLLED WITH YOUR NAUSEA MEDICATION  *UNUSUAL SHORTNESS OF BREATH  *UNUSUAL BRUISING OR BLEEDING  TENDERNESS IN MOUTH AND THROAT WITH OR WITHOUT PRESENCE OF ULCERS  *URINARY PROBLEMS  *BOWEL PROBLEMS  UNUSUAL RASH Items with * indicate a potential emergency and should be followed up as soon as possible.  Feel free to call the clinic you have any questions or concerns. The clinic phone number is [phone].

## 2014-11-13 NOTE — Progress Notes (Signed)
Pump programmed to 42 hours to allow for pump disconnect at approximately 2 pm.

## 2014-11-14 ENCOUNTER — Telehealth: Payer: Self-pay | Admitting: *Deleted

## 2014-11-14 ENCOUNTER — Telehealth: Payer: Self-pay | Admitting: General Surgery

## 2014-11-14 LAB — CANCER ANTIGEN 19-9: CA 19-9: 33.4 U/mL (ref <35.0)

## 2014-11-14 NOTE — Telephone Encounter (Signed)
Called and informed patient that CA 19-9 tumor marker has improved and is now in the normal range.  Per Elby Showers. Marcello Moores, NP.  Patient verbalized understanding.

## 2014-11-14 NOTE — Telephone Encounter (Signed)
Per staff message and POF I have scheduled appts. Advised scheduler of appts first available is 1/6. JMW

## 2014-11-14 NOTE — Telephone Encounter (Signed)
-----   Message from Owens Shark, NP sent at 11/14/2014  9:13 AM EST ----- Please let him know the CA-19-9 tumor marker is further improved and is now in normal range.

## 2014-11-14 NOTE — Telephone Encounter (Signed)
s.w. pt and advised on 1.8 @ 9:30 appt with Dr. Barry Dienes

## 2014-11-15 ENCOUNTER — Ambulatory Visit (HOSPITAL_BASED_OUTPATIENT_CLINIC_OR_DEPARTMENT_OTHER): Payer: BC Managed Care – PPO

## 2014-11-15 DIAGNOSIS — C250 Malignant neoplasm of head of pancreas: Secondary | ICD-10-CM

## 2014-11-15 MED ORDER — SODIUM CHLORIDE 0.9 % IJ SOLN
10.0000 mL | INTRAMUSCULAR | Status: DC | PRN
  Administered 2014-11-15: 10 mL
  Filled 2014-11-15: qty 10

## 2014-11-15 MED ORDER — HEPARIN SOD (PORK) LOCK FLUSH 100 UNIT/ML IV SOLN
500.0000 [IU] | Freq: Once | INTRAVENOUS | Status: AC | PRN
  Administered 2014-11-15: 500 [IU]
  Filled 2014-11-15: qty 5

## 2014-11-25 ENCOUNTER — Other Ambulatory Visit: Payer: Self-pay | Admitting: Oncology

## 2014-11-26 ENCOUNTER — Telehealth: Payer: Self-pay | Admitting: *Deleted

## 2014-11-26 ENCOUNTER — Other Ambulatory Visit (HOSPITAL_BASED_OUTPATIENT_CLINIC_OR_DEPARTMENT_OTHER): Payer: BLUE CROSS/BLUE SHIELD

## 2014-11-26 ENCOUNTER — Other Ambulatory Visit: Payer: Self-pay | Admitting: *Deleted

## 2014-11-26 DIAGNOSIS — R300 Dysuria: Secondary | ICD-10-CM

## 2014-11-26 DIAGNOSIS — C250 Malignant neoplasm of head of pancreas: Secondary | ICD-10-CM

## 2014-11-26 LAB — COMPREHENSIVE METABOLIC PANEL (CC13)
ALBUMIN: 3.1 g/dL — AB (ref 3.5–5.0)
ALT: 23 U/L (ref 0–55)
ANION GAP: 12 meq/L — AB (ref 3–11)
AST: 17 U/L (ref 5–34)
Alkaline Phosphatase: 81 U/L (ref 40–150)
BUN: 13.4 mg/dL (ref 7.0–26.0)
CALCIUM: 9.6 mg/dL (ref 8.4–10.4)
CHLORIDE: 101 meq/L (ref 98–109)
CO2: 26 mEq/L (ref 22–29)
Creatinine: 0.8 mg/dL (ref 0.7–1.3)
GLUCOSE: 122 mg/dL (ref 70–140)
POTASSIUM: 4.5 meq/L (ref 3.5–5.1)
SODIUM: 139 meq/L (ref 136–145)
Total Bilirubin: 0.59 mg/dL (ref 0.20–1.20)
Total Protein: 6.4 g/dL (ref 6.4–8.3)

## 2014-11-26 LAB — CBC WITH DIFFERENTIAL/PLATELET
BASO%: 0.8 % (ref 0.0–2.0)
BASOS ABS: 0.1 10*3/uL (ref 0.0–0.1)
EOS%: 1.2 % (ref 0.0–7.0)
Eosinophils Absolute: 0.1 10*3/uL (ref 0.0–0.5)
HEMATOCRIT: 30.5 % — AB (ref 38.4–49.9)
HEMOGLOBIN: 10 g/dL — AB (ref 13.0–17.1)
LYMPH%: 11.3 % — ABNORMAL LOW (ref 14.0–49.0)
MCH: 28.9 pg (ref 27.2–33.4)
MCHC: 32.9 g/dL (ref 32.0–36.0)
MCV: 88 fL (ref 79.3–98.0)
MONO#: 1.3 10*3/uL — ABNORMAL HIGH (ref 0.1–0.9)
MONO%: 14.1 % — AB (ref 0.0–14.0)
NEUT#: 6.8 10*3/uL — ABNORMAL HIGH (ref 1.5–6.5)
NEUT%: 72.6 % (ref 39.0–75.0)
Platelets: 354 10*3/uL (ref 140–400)
RBC: 3.47 10*6/uL — ABNORMAL LOW (ref 4.20–5.82)
RDW: 14.1 % (ref 11.0–14.6)
WBC: 9.3 10*3/uL (ref 4.0–10.3)
lymph#: 1.1 10*3/uL (ref 0.9–3.3)

## 2014-11-26 LAB — URINALYSIS, MICROSCOPIC - CHCC
BLOOD: NEGATIVE
Bilirubin (Urine): NEGATIVE
GLUCOSE UR CHCC: NEGATIVE mg/dL
Ketones: NEGATIVE mg/dL
Leukocyte Esterase: NEGATIVE
Nitrite: NEGATIVE
PROTEIN: NEGATIVE mg/dL
RBC / HPF: NEGATIVE (ref 0–2)
Specific Gravity, Urine: 1.01 (ref 1.003–1.035)
UROBILINOGEN UR: 0.2 mg/dL (ref 0.2–1)
pH: 6 (ref 4.6–8.0)

## 2014-11-26 MED ORDER — TAMSULOSIN HCL 0.4 MG PO CAPS: 0.4000 mg | ORAL_CAPSULE | Freq: Every day | ORAL | Status: DC

## 2014-11-26 NOTE — Telephone Encounter (Signed)
Called to report he is voiding every 2 hours with some burning with each void. Initial stream is weak, but then improves. He will come in today for U/A and have his labs for tomorrow also done at the same time.

## 2014-11-26 NOTE — Telephone Encounter (Signed)
Per Dr. Benay Spice, U/A looks OK-could be prostate issue or a side effect of the oxaliplatin. Confirmed he was still taking his flomax, but only has #2 left. Dr. Benay Spice may need to refer him to Dr. Risa Grill if symptoms persist. Confirmed he will come in tomorrow for his office visit as scheduled.

## 2014-11-26 NOTE — Addendum Note (Signed)
Addended by: Tania Ade on: 11/26/2014 04:15 PM   Modules accepted: Orders

## 2014-11-27 ENCOUNTER — Ambulatory Visit (HOSPITAL_BASED_OUTPATIENT_CLINIC_OR_DEPARTMENT_OTHER): Payer: BLUE CROSS/BLUE SHIELD | Admitting: Nurse Practitioner

## 2014-11-27 ENCOUNTER — Ambulatory Visit (HOSPITAL_BASED_OUTPATIENT_CLINIC_OR_DEPARTMENT_OTHER): Payer: BLUE CROSS/BLUE SHIELD

## 2014-11-27 ENCOUNTER — Other Ambulatory Visit: Payer: BC Managed Care – PPO

## 2014-11-27 ENCOUNTER — Telehealth: Payer: Self-pay | Admitting: *Deleted

## 2014-11-27 ENCOUNTER — Telehealth: Payer: Self-pay | Admitting: Nurse Practitioner

## 2014-11-27 VITALS — BP 79/63 | HR 124 | Temp 98.0°F | Resp 23 | Ht 69.0 in | Wt 163.6 lb

## 2014-11-27 VITALS — BP 88/55 | HR 95

## 2014-11-27 DIAGNOSIS — I959 Hypotension, unspecified: Secondary | ICD-10-CM

## 2014-11-27 DIAGNOSIS — C250 Malignant neoplasm of head of pancreas: Secondary | ICD-10-CM

## 2014-11-27 LAB — CANCER ANTIGEN 19-9: CA 19-9: 30.5 U/mL (ref <35.0)

## 2014-11-27 MED ORDER — SODIUM CHLORIDE 0.9 % IV SOLN
INTRAVENOUS | Status: DC
  Administered 2014-11-27: 11:00:00 via INTRAVENOUS

## 2014-11-27 MED ORDER — HEPARIN SOD (PORK) LOCK FLUSH 100 UNIT/ML IV SOLN
500.0000 [IU] | Freq: Once | INTRAVENOUS | Status: AC
  Administered 2014-11-27: 500 [IU] via INTRAVENOUS
  Filled 2014-11-27: qty 5

## 2014-11-27 MED ORDER — SODIUM CHLORIDE 0.9 % IJ SOLN
10.0000 mL | INTRAMUSCULAR | Status: DC | PRN
  Administered 2014-11-27: 10 mL via INTRAVENOUS
  Filled 2014-11-27: qty 10

## 2014-11-27 NOTE — Telephone Encounter (Signed)
I contacted Richard Cantu to let him know Dr. Benay Spice recommends holding oxaliplatin with tomorrow's treatment due to the persistent low blood pressure and urinary symptoms.   He reports his stools are typically a clay color. He noted with a bowel movement earlier today that his stool was a "reddish" color that did not appear to be blood. He denies any other new symptoms such as nausea, vomiting, abdominal pain. He understands to seek emergency evaluation overnight if he notes bleeding or onset of other concerning symptoms.

## 2014-11-27 NOTE — Progress Notes (Addendum)
[place] OFFICE PROGRESS NOTE   Diagnosis:  Pancreas cancer  INTERVAL HISTORY:   He returns as scheduled. He completed cycle 4 FOLFIRINOX on 11/13/2014. He denies nausea/vomiting. No mouth sores. No diarrhea. He has mild intermittent tingling at the lateral edges of the palms bilaterally. No numbness. Pain continues to be well-controlled with MS Contin. He is experiencing frequent urination. Mild dysuria. A urinalysis done yesterday was unremarkable. He has mild dyspnea on exertion which he thinks is mildly increased. No cough, fever or chest pain. He denies leg swelling and calf pain. He feels fluid intake is adequate.  Objective:  Vital signs in last 24 hours:  Blood pressure 79/63, pulse 124, temperature 98 F (36.7 C), temperature source Oral, resp. rate 23, height 5\' 9"  (1.753 m), weight 163 lb 9.6 oz (74.208 kg), SpO2 100 %. repeat heart rate 112    HEENT: No thrush or ulcers. Tongue appears dry. Resp: Lungs clear bilaterally. Cardio: Regular rate and rhythm. GI: Abdomen soft and nontender. No hepatomegaly. Vascular: No leg edema. Calves soft and nontender. Port-A-Cath without erythema.    Lab Results:  Lab Results  Component Value Date   WBC 9.3 11/26/2014   HGB 10.0* 11/26/2014   HCT 30.5* 11/26/2014   MCV 88.0 11/26/2014   PLT 354 11/26/2014   NEUTROABS 6.8* 11/26/2014    Imaging:  No results found.  Medications: I have reviewed the patient's current medications.  Assessment/Plan: 1. 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  Cycle 3 FOLFIRINOX 10/31/2014.  CA-19-9 improved (33.4) on 11/13/2014.  Cycle 4 FOLFIRINOX 11/13/2014.  CA-19-9  improved (30.5) 11/26/2014. 2. Pain secondary to #1, improved.  3. Psoriasis   4. Acute nausea and vomiting with cycle 1 FOLFIRINOX-emend and prophylactic Decadron were added with cycle 2, improved.  5. Urinary hesitancy-this is most likely related to narcotic analgesics, chemotherapy, and prostatic hypertrophy. 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 Improved with Flomax.  6. Anorexia/weight loss secondary to #1  7. Orthostatic hypotension. 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    Disposition: Richard Cantu appears stable. He has completed 4 cycles of FOLFIRINOX. His pain is better. The CA-19-9 continues to improve and is now in normal range.  He continues to have hypotension. He does not appear significantly dehydrated. 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 We will give him a 500 mL normal saline fluid bolus today with pre-and post vital signs. He will continue to wear support stockings and change positions slowly.  He also continues to have urinary symptoms. Urinalysis yesterday was unremarkable. 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 A referral to urology will be considered.  He is scheduled for cycle 5 FOLFIRINOX on 11/28/2014. I will discuss the persistent blood pressure and urinary symptoms with Richard Cantu prior to proceeding with treatment tomorrow.  Richard Cantu is scheduled for restaging CT scans on 12/06/2014. He has a follow-up visit on 12/11/2014.  25 minutes were spent face-to-face at today's visit with the majority of that time involved in counseling/coordination of care.  Ned Card ANP/GNP-BC   11/27/2014  11:28 AM   Addendum- I reviewed the above with Richard Cantu. Oxaliplatin will be held with treatment on  11/28/2014.  Ned Card, ANP/GNP-BC  11/27/2014 4:20 PM

## 2014-11-27 NOTE — Telephone Encounter (Signed)
Left VM asking if he needs to keep his appointment on 11/30/14 at [facility] with [facility]? Having CT scan on 12/06/14.

## 2014-11-27 NOTE — Patient Instructions (Signed)
Dehydration, Adult Dehydration is when you lose more fluids from the body than you take in. Vital organs like the kidneys, brain, and heart cannot function without a proper amount of fluids and salt. Any loss of fluids from the body can cause dehydration.  CAUSES   Vomiting.  Diarrhea.  Excessive sweating.  Excessive urine output.  Fever. SYMPTOMS  Mild dehydration  Thirst.  Dry lips.  Slightly dry mouth. Moderate dehydration  Very dry mouth.  Sunken eyes.  Skin does not bounce back quickly when lightly pinched and released.  Dark urine and decreased urine production.  Decreased tear production.  Headache. Severe dehydration  Very dry mouth.  Extreme thirst.  Rapid, weak pulse (more than 100 beats per minute at rest).  Cold hands and feet.  Not able to sweat in spite of heat and temperature.  Rapid breathing.  Blue lips.  Confusion and lethargy.  Difficulty being awakened.  Minimal urine production.  No tears. DIAGNOSIS  Your caregiver will diagnose dehydration based on your symptoms and your exam. Blood and urine tests will help confirm the diagnosis. The diagnostic evaluation should also identify the cause of dehydration. TREATMENT  Treatment of mild or moderate dehydration can often be done at home by increasing the amount of fluids that you drink. It is best to drink small amounts of fluid more often. Drinking too much at one time can make vomiting worse. Refer to the home care instructions below. Severe dehydration needs to be treated at the hospital where you will probably be given intravenous (IV) fluids that contain water and electrolytes. HOME CARE INSTRUCTIONS   Ask your caregiver about specific rehydration instructions.  Drink enough fluids to keep your urine clear or pale yellow.  Drink small amounts frequently if you have nausea and vomiting.  Eat as you normally do.  Avoid:  Foods or drinks high in sugar.  Carbonated  drinks.  Juice.  Extremely hot or cold fluids.  Drinks with caffeine.  Fatty, greasy foods.  Alcohol.  Tobacco.  Overeating.  Gelatin desserts.  Wash your hands well to avoid spreading bacteria and viruses.  Only take over-the-counter or prescription medicines for pain, discomfort, or fever as directed by your caregiver.  Ask your caregiver if you should continue all prescribed and over-the-counter medicines.  Keep all follow-up appointments with your caregiver. SEEK MEDICAL CARE IF:  You have abdominal pain and it increases or stays in one area (localizes).  You have a rash, stiff neck, or severe headache.  You are irritable, sleepy, or difficult to awaken.  You are weak, dizzy, or extremely thirsty. SEEK IMMEDIATE MEDICAL CARE IF:   You are unable to keep fluids down or you get worse despite treatment.  You have frequent episodes of vomiting or diarrhea.  You have blood or green matter (bile) in your vomit.  You have blood in your stool or your stool looks black and tarry.  You have not urinated in 6 to 8 hours, or you have only urinated a small amount of very dark urine.  You have a fever.  You faint. MAKE SURE YOU:   Understand these instructions.  Will watch your condition.  Will get help right away if you are not doing well or get worse. Document Released: 11/09/2005 Document Revised: 02/01/2012 Document Reviewed: 06/29/2011 Exit[employer] Patient Information 2015 [employer]. This information is not intended to replace advice given to you by your health care provider. Make sure you discuss any questions you have with your health care   provider.  

## 2014-11-28 ENCOUNTER — Encounter (HOSPITAL_COMMUNITY): Payer: Self-pay | Admitting: Family Medicine

## 2014-11-28 ENCOUNTER — Other Ambulatory Visit: Payer: Self-pay

## 2014-11-28 ENCOUNTER — Ambulatory Visit
Admit: 2014-11-28 | Discharge: 2014-11-28 | Disposition: A | Discharge status: Home / Self Care | Payer: BLUE CROSS/BLUE SHIELD | Attending: Radiation Oncology | Admitting: Radiation Oncology

## 2014-11-28 ENCOUNTER — Encounter (HOSPITAL_COMMUNITY)
Admission: EM | Disposition: A | Discharge status: Home / Self Care | Payer: Self-pay | Source: Home / Self Care | Attending: Internal Medicine

## 2014-11-28 ENCOUNTER — Inpatient Hospital Stay (HOSPITAL_COMMUNITY)
Admission: EM | Admit: 2014-11-28 | Discharge: 2014-12-05 | DRG: 377 | Disposition: A | Discharge status: Home / Self Care | Payer: BLUE CROSS/BLUE SHIELD | Attending: Internal Medicine | Admitting: Internal Medicine

## 2014-11-28 ENCOUNTER — Emergency Department (HOSPITAL_COMMUNITY): Payer: BLUE CROSS/BLUE SHIELD

## 2014-11-28 ENCOUNTER — Ambulatory Visit: Payer: BC Managed Care – PPO

## 2014-11-28 DIAGNOSIS — I10 Essential (primary) hypertension: Secondary | ICD-10-CM | POA: Diagnosis present

## 2014-11-28 DIAGNOSIS — K219 Gastro-esophageal reflux disease without esophagitis: Secondary | ICD-10-CM | POA: Diagnosis present

## 2014-11-28 DIAGNOSIS — E43 Unspecified severe protein-calorie malnutrition: Secondary | ICD-10-CM | POA: Diagnosis present

## 2014-11-28 DIAGNOSIS — Z79891 Long term (current) use of opiate analgesic: Secondary | ICD-10-CM | POA: Diagnosis not present

## 2014-11-28 DIAGNOSIS — D62 Acute posthemorrhagic anemia: Secondary | ICD-10-CM | POA: Diagnosis present

## 2014-11-28 DIAGNOSIS — K922 Gastrointestinal hemorrhage, unspecified: Secondary | ICD-10-CM | POA: Diagnosis present

## 2014-11-28 DIAGNOSIS — I959 Hypotension, unspecified: Secondary | ICD-10-CM | POA: Diagnosis present

## 2014-11-28 DIAGNOSIS — R110 Nausea: Secondary | ICD-10-CM | POA: Diagnosis not present

## 2014-11-28 DIAGNOSIS — N400 Enlarged prostate without lower urinary tract symptoms: Secondary | ICD-10-CM | POA: Insufficient documentation

## 2014-11-28 DIAGNOSIS — R0602 Shortness of breath: Secondary | ICD-10-CM

## 2014-11-28 DIAGNOSIS — C787 Secondary malignant neoplasm of liver and intrahepatic bile duct: Secondary | ICD-10-CM | POA: Diagnosis present

## 2014-11-28 DIAGNOSIS — E861 Hypovolemia: Secondary | ICD-10-CM | POA: Diagnosis present

## 2014-11-28 DIAGNOSIS — R3911 Hesitancy of micturition: Secondary | ICD-10-CM | POA: Diagnosis not present

## 2014-11-28 DIAGNOSIS — M109 Gout, unspecified: Secondary | ICD-10-CM | POA: Diagnosis present

## 2014-11-28 DIAGNOSIS — L409 Psoriasis, unspecified: Secondary | ICD-10-CM | POA: Diagnosis present

## 2014-11-28 DIAGNOSIS — Z87891 Personal history of nicotine dependence: Secondary | ICD-10-CM | POA: Diagnosis not present

## 2014-11-28 DIAGNOSIS — Z9889 Other specified postprocedural states: Secondary | ICD-10-CM

## 2014-11-28 DIAGNOSIS — F419 Anxiety disorder, unspecified: Secondary | ICD-10-CM | POA: Diagnosis present

## 2014-11-28 DIAGNOSIS — C250 Malignant neoplasm of head of pancreas: Secondary | ICD-10-CM | POA: Diagnosis present

## 2014-11-28 DIAGNOSIS — G893 Neoplasm related pain (acute) (chronic): Secondary | ICD-10-CM | POA: Diagnosis present

## 2014-11-28 DIAGNOSIS — R58 Hemorrhage, not elsewhere classified: Secondary | ICD-10-CM | POA: Insufficient documentation

## 2014-11-28 DIAGNOSIS — R531 Weakness: Secondary | ICD-10-CM | POA: Diagnosis not present

## 2014-11-28 DIAGNOSIS — K3189 Other diseases of stomach and duodenum: Secondary | ICD-10-CM | POA: Diagnosis present

## 2014-11-28 DIAGNOSIS — Z6826 Body mass index (BMI) 26.0-26.9, adult: Secondary | ICD-10-CM

## 2014-11-28 DIAGNOSIS — R358 Other polyuria: Secondary | ICD-10-CM | POA: Diagnosis not present

## 2014-11-28 DIAGNOSIS — E876 Hypokalemia: Secondary | ICD-10-CM | POA: Diagnosis not present

## 2014-11-28 DIAGNOSIS — R52 Pain, unspecified: Secondary | ICD-10-CM

## 2014-11-28 DIAGNOSIS — Z79899 Other long term (current) drug therapy: Secondary | ICD-10-CM | POA: Diagnosis not present

## 2014-11-28 HISTORY — PX: ESOPHAGOGASTRODUODENOSCOPY: SHX5428

## 2014-11-28 LAB — CBC WITH DIFFERENTIAL/PLATELET
BASOS ABS: 0 10*3/uL (ref 0.0–0.1)
BASOS ABS: 0 10*3/uL (ref 0.0–0.1)
BASOS ABS: 0 10*3/uL (ref 0.0–0.1)
BASOS PCT: 0 % (ref 0–1)
BASOS PCT: 0 % (ref 0–1)
Basophils Relative: 0 % (ref 0–1)
EOS ABS: 0 10*3/uL (ref 0.0–0.7)
EOS ABS: 0 10*3/uL (ref 0.0–0.7)
EOS PCT: 0 % (ref 0–5)
EOS PCT: 1 % (ref 0–5)
Eosinophils Absolute: 0 10*3/uL (ref 0.0–0.7)
Eosinophils Relative: 0 % (ref 0–5)
HCT: 16.5 % — ABNORMAL LOW (ref 39.0–52.0)
HCT: 21.3 % — ABNORMAL LOW (ref 39.0–52.0)
HCT: 25 % — ABNORMAL LOW (ref 39.0–52.0)
HEMOGLOBIN: 5.6 g/dL — AB (ref 13.0–17.0)
HEMOGLOBIN: 7.2 g/dL — AB (ref 13.0–17.0)
HEMOGLOBIN: 8.4 g/dL — AB (ref 13.0–17.0)
LYMPHS ABS: 0.9 10*3/uL (ref 0.7–4.0)
Lymphocytes Relative: 10 % — ABNORMAL LOW (ref 12–46)
Lymphocytes Relative: 17 % (ref 12–46)
Lymphocytes Relative: 15 % (ref 12–46)
Lymphs Abs: 0.7 10*3/uL (ref 0.7–4.0)
Lymphs Abs: 0.9 10*3/uL (ref 0.7–4.0)
MCH: 29.6 pg (ref 26.0–34.0)
MCH: 29.4 pg (ref 26.0–34.0)
MCH: 29.3 pg (ref 26.0–34.0)
MCHC: 33.6 g/dL (ref 30.0–36.0)
MCHC: 33.8 g/dL (ref 30.0–36.0)
MCHC: 33.9 g/dL (ref 30.0–36.0)
MCV: 86.9 fL (ref 78.0–100.0)
MCV: 87.1 fL (ref 78.0–100.0)
MCV: 87.3 fL (ref 78.0–100.0)
MONO ABS: 0.9 10*3/uL (ref 0.1–1.0)
MONO ABS: 1.1 10*3/uL — AB (ref 0.1–1.0)
MONOS PCT: 15 % — AB (ref 3–12)
MONOS PCT: 16 % — AB (ref 3–12)
Monocytes Absolute: 1 10*3/uL (ref 0.1–1.0)
Monocytes Relative: 18 % — ABNORMAL HIGH (ref 3–12)
Neutro Abs: 5.5 10*3/uL (ref 1.7–7.7)
Neutro Abs: 3.4 10*3/uL (ref 1.7–7.7)
Neutro Abs: 4 10*3/uL (ref 1.7–7.7)
Neutrophils Relative %: 75 % (ref 43–77)
Neutrophils Relative %: 65 % (ref 43–77)
Neutrophils Relative %: 68 % (ref 43–77)
Platelets: 248 10*3/uL (ref 150–400)
Platelets: 200 10*3/uL (ref 150–400)
Platelets: 222 10*3/uL (ref 150–400)
RBC: 1.89 MIL/uL — ABNORMAL LOW (ref 4.22–5.81)
RBC: 2.87 MIL/uL — AB (ref 4.22–5.81)
RBC: 2.45 MIL/uL — ABNORMAL LOW (ref 4.22–5.81)
RDW: 14 % (ref 11.5–15.5)
RDW: 13.6 % (ref 11.5–15.5)
RDW: 14.2 % (ref 11.5–15.5)
WBC: 7.3 10*3/uL (ref 4.0–10.5)
WBC: 5.3 10*3/uL (ref 4.0–10.5)
WBC: 5.9 10*3/uL (ref 4.0–10.5)

## 2014-11-28 LAB — COMPREHENSIVE METABOLIC PANEL
ALBUMIN: 2.5 g/dL — AB (ref 3.5–5.2)
ALK PHOS: 48 U/L (ref 39–117)
ALT: 15 U/L (ref 0–53)
ALT: 17 U/L (ref 0–53)
ANION GAP: 10 (ref 5–15)
AST: 15 U/L (ref 0–37)
AST: 18 U/L (ref 0–37)
Albumin: 2.2 g/dL — ABNORMAL LOW (ref 3.5–5.2)
Alkaline Phosphatase: 44 U/L (ref 39–117)
Anion gap: 5 (ref 5–15)
BUN: 19 mg/dL (ref 6–23)
BUN: 16 mg/dL (ref 6–23)
CALCIUM: 7.2 mg/dL — AB (ref 8.4–10.5)
CHLORIDE: 103 meq/L (ref 96–112)
CO2: 23 mmol/L (ref 19–32)
CO2: 24 mmol/L (ref 19–32)
CREATININE: 0.45 mg/dL — AB (ref 0.50–1.35)
CREATININE: 0.56 mg/dL (ref 0.50–1.35)
Calcium: 7.8 mg/dL — ABNORMAL LOW (ref 8.4–10.5)
Chloride: 109 mEq/L (ref 96–112)
GFR calc Af Amer: >90 mL/min (ref >90)
GFR calc Af Amer: >90 mL/min (ref >90)
GFR calc non Af Amer: >90 mL/min (ref >90)
GFR calc non Af Amer: >90 mL/min (ref >90)
GLUCOSE: 177 mg/dL — AB (ref 70–99)
Glucose, Bld: 111 mg/dL — ABNORMAL HIGH (ref 70–99)
POTASSIUM: 3.7 mmol/L (ref 3.5–5.1)
Potassium: 3.6 mmol/L (ref 3.5–5.1)
Sodium: 136 mmol/L (ref 135–145)
Sodium: 138 mmol/L (ref 135–145)
TOTAL PROTEIN: 4.9 g/dL — AB (ref 6.0–8.3)
Total Bilirubin: 0.5 mg/dL (ref 0.3–1.2)
Total Bilirubin: 0.5 mg/dL (ref 0.3–1.2)
Total Protein: 4.4 g/dL — ABNORMAL LOW (ref 6.0–8.3)

## 2014-11-28 LAB — TROPONIN I

## 2014-11-28 LAB — PROTIME-INR
INR: 1.37 (ref 0.00–1.49)
Prothrombin Time: 17 seconds — ABNORMAL HIGH (ref 11.6–15.2)

## 2014-11-28 LAB — POC OCCULT BLOOD, ED: Fecal Occult Bld: POSITIVE — AB

## 2014-11-28 LAB — BRAIN NATRIURETIC PEPTIDE: B NATRIURETIC PEPTIDE 5: 25.1 pg/mL (ref 0.0–100.0)

## 2014-11-28 LAB — ABO/RH: ABO/RH(D): A POS

## 2014-11-28 LAB — APTT: aPTT: 35 seconds (ref 24–37)

## 2014-11-28 LAB — PREPARE RBC (CROSSMATCH)

## 2014-11-28 LAB — LACTIC ACID, PLASMA: Lactic Acid, Venous: 2 mmol/L (ref 0.5–2.2)

## 2014-11-28 LAB — MRSA PCR SCREENING: MRSA by PCR: NEGATIVE

## 2014-11-28 LAB — LIPASE, BLOOD: LIPASE: 660 U/L — AB (ref 11–59)

## 2014-11-28 SURGERY — EGD (ESOPHAGOGASTRODUODENOSCOPY)
Anesthesia: Moderate Sedation

## 2014-11-28 MED ORDER — BUTAMBEN-TETRACAINE-BENZOCAINE 2-2-14 % EX AERO
INHALATION_SPRAY | CUTANEOUS | Status: DC | PRN
  Administered 2014-11-28: 2 via TOPICAL

## 2014-11-28 MED ORDER — MORPHINE SULFATE 4 MG/ML IJ SOLN
5.0000 mg | INTRAMUSCULAR | Status: DC | PRN
  Administered 2014-11-28 (×2): 5 mg via INTRAVENOUS
  Administered 2014-11-28 – 2014-11-29 (×3): 10 mg via INTRAVENOUS
  Filled 2014-11-28: qty 2
  Filled 2014-11-28 (×3): qty 3
  Filled 2014-11-28: qty 2

## 2014-11-28 MED ORDER — MIDAZOLAM HCL 10 MG/2ML IJ SOLN
INTRAMUSCULAR | Status: AC
  Filled 2014-11-28: qty 2

## 2014-11-28 MED ORDER — SODIUM CHLORIDE 0.9 % IV SOLN: INTRAVENOUS | Status: DC

## 2014-11-28 MED ORDER — SODIUM CHLORIDE 0.9 % IJ SOLN
3.0000 mL | Freq: Two times a day (BID) | INTRAMUSCULAR | Status: DC
  Administered 2014-11-28 – 2014-12-05 (×14): 3 mL via INTRAVENOUS

## 2014-11-28 MED ORDER — DIPHENHYDRAMINE HCL 50 MG/ML IJ SOLN
INTRAMUSCULAR | Status: AC
  Filled 2014-11-28: qty 1

## 2014-11-28 MED ORDER — SODIUM CHLORIDE 0.9 % IV SOLN
Freq: Once | INTRAVENOUS | Status: AC
  Administered 2014-11-28: 12:00:00 via INTRAVENOUS

## 2014-11-28 MED ORDER — MIDAZOLAM HCL 10 MG/2ML IJ SOLN
INTRAMUSCULAR | Status: DC | PRN
  Administered 2014-11-28 (×5): 2 mg via INTRAVENOUS

## 2014-11-28 MED ORDER — SODIUM CHLORIDE 0.9 % IV SOLN
INTRAVENOUS | Status: DC
  Administered 2014-11-28 – 2014-11-29 (×4): via INTRAVENOUS
  Administered 2014-11-29: 125 mL/h via INTRAVENOUS
  Administered 2014-11-29 – 2014-12-05 (×9): via INTRAVENOUS

## 2014-11-28 MED ORDER — ONDANSETRON HCL 4 MG/2ML IJ SOLN
4.0000 mg | Freq: Once | INTRAMUSCULAR | Status: AC
  Administered 2014-11-28: 4 mg via INTRAVENOUS
  Filled 2014-11-28: qty 2

## 2014-11-28 MED ORDER — FENTANYL CITRATE 0.05 MG/ML IJ SOLN
INTRAMUSCULAR | Status: AC
  Filled 2014-11-28: qty 2

## 2014-11-28 MED ORDER — SODIUM CHLORIDE 0.9 % IV SOLN
10.0000 mL/h | Freq: Once | INTRAVENOUS | Status: AC
  Administered 2014-11-28: 10 mL/h via INTRAVENOUS

## 2014-11-28 MED ORDER — SODIUM CHLORIDE 0.9 % IV BOLUS (SEPSIS)
1000.0000 mL | Freq: Once | INTRAVENOUS | Status: AC
  Administered 2014-11-28: 1000 mL via INTRAVENOUS

## 2014-11-28 MED ORDER — FENTANYL CITRATE 0.05 MG/ML IJ SOLN
INTRAMUSCULAR | Status: DC | PRN
  Administered 2014-11-28 (×4): 25 ug via INTRAVENOUS

## 2014-11-28 NOTE — H&P (Signed)
Hospitalist Admission History and Physical  Patient name: Richard Cantu record number: [id number] Date of birth: Aug 17, 1956 Age: 59 y.o. Gender: male  Primary Care Provider: Donnie Coffin, MD  Chief Complaint: GIB, hypotension, acute blood loss anemia  History of Present Illness:This is a 58 y.o. year old male with significant past medical history of pancreatic cancer on chemotherapy, HTN, GERD presenting with GIB, hypotension, acute blood loss anemia. Pt reports black stools over past 1-2 days. Has received 4 rounds of chemotherapy for pancreatic cancer since 07/2014 w/ improving markers and pain. Is due for next round of chemo 11/28/2014. Stools then progressed to bright red x 4-5 over past 24 hours. Had appt w/ oncology yesterday. Noted LLN BPs. Was IVFs. Per pt, he notified HCP of black stools. Was informed that if stools worsened to go to ER. Denies any prior hx/o GI disease prior to presentation.  Presented to ER T 98.9, HR 90s-120s, resp 10s-20s, BP 70s-100s-mainly in 80s. Satting 98% on RA. hgb 5.6 today. Hemoccult positive. Lactate WNL. Has been typed and crossed. Renal function WNL. Case discussed with on call PCCM and GI. Admit to ICU @ [facility]. Consult PCCM as needed. [facility] aware of case.   Assessment and Plan: [name] is a 59 y.o. year old male presenting with GIB  Active Problems:   GIB (gastrointestinal bleeding)   1- GIB -delta hgb around 5 over past 48 hours(10-->5.6).   -type and cross -serial CBCs -ICU bed  -follow BP -GI consult in am  2- Hypotension  -Hypovolemic in setting of ABLA and Ca -transfuse pRBCs -IVFs  -ICU BP -follow closely  -PCCM c/s as clinically indicated   3- Pancreatic Cancer -on chemotherapy  -? If this may have been inciting agent for above  -h/o consult in am    FEN/GI: NPO Prophylaxis: SCDs  Disposition: pending further evaluation  Code Status:Full Code    Patient Active Problem List   Diagnosis Date Noted  .  GIB (gastrointestinal bleeding) 11/28/2014  . Cancer of head of pancreas 09/17/2014  . Acute recurrent pancreatitis 08/30/2014  . Hypertension 08/30/2014  . Abdominal pain 08/30/2014  . Pancreatic pseudocyst 08/30/2014  . Pseudocyst of pancreas 08/30/2014   Past Medical History: Past Medical History  Diagnosis Date  . Hemorrhoids   . Chronic pancreatitis   . Pancreatic cyst dx'd 08/03/2014  . History of gout   . Anxiety   . 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 HTN (hypertension)   . Wears glasses   . GERD (gastroesophageal reflux disease)     Past Surgical History: Past Surgical History  Procedure Laterality Date  . Colonoscopy    . Eus N/A 08/03/2014    Procedure: ESOPHAGEAL ENDOSCOPIC ULTRASOUND (EUS) RADIAL;  Surgeon: Arta Silence, MD;  Location: [facility];  Service: Endoscopy;  Laterality: N/A;  H&P in file  . Eus N/A 09/04/2014    Procedure: ESOPHAGEAL ENDOSCOPIC ULTRASOUND (EUS) RADIAL;  Surgeon: Arta Silence, MD;  Location: [facility];  Service: Endoscopy;  Laterality: N/A;  . Fine needle aspiration N/A 09/04/2014    Procedure: FINE NEEDLE ASPIRATION (FNA) RADIAL;  Surgeon: Arta Silence, MD;  Location: [facility];  Service: Endoscopy;  Laterality: N/A;  . Portacath placement Left 09/25/2014    Procedure: INSERTION PORT-A-CATH;  Surgeon: Stark Klein, MD;  Location: [facility];  Service: General;  Laterality: Left;    Social History: History   Social History  . Marital Status: Divorced    Spouse Name:  N/A    Number of Children: N/A  . Years of Education: N/A   Social History Main Topics  . Smoking status: Former Smoker -- 0.50 packs/day for 30 years    Types: Cigarettes    Quit date: 09/21/2011  . Smokeless tobacco: Never Used     Comment: "quit smoking  <2014"  . Alcohol Use: Yes     Comment: 08/31/2014 "stopped drinking 07/24/2014"  . Drug Use: No  . Sexual Activity: None     Comment: 08/02/14--quit a couple years ago    Other Topics Concern  . 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 No pertinent family history.  Allergies: No Known Allergies  Current Facility-Administered Medications  Medication Dose Route Frequency Provider Last Rate Last Dose  . 0.9 %  sodium chloride infusion   Intravenous Continuous Shanda Howells, MD      . sodium chloride 0.9 % injection 3 mL  3 mL Intravenous Q12H Shanda Howells, MD       Current Outpatient Prescriptions  Medication Sig Dispense Refill  . ALPRAZolam (XANAX) 1 MG tablet Take 1 tablet (1 mg total) by mouth at bedtime as needed for anxiety. 30 tablet 0  . ibuprofen (ADVIL,MOTRIN) 200 MG tablet Take 800 mg by mouth every 8 (eight) hours as needed for moderate pain.    Marland Kitchen lactose free nutrition (BOOST) LIQD Take 237 mLs by mouth 4 (four) times daily.    Marland Kitchen lidocaine-prilocaine (EMLA) cream Apply 1 application topically as needed. Apply to [facility] site 1-2 hours prior to stick and cover with plastic wrap 30 g 11  . morphine (MS CONTIN) 60 MG 12 hr tablet Take 1 tablet (60 mg total) by mouth every 12 (twelve) hours. 60 tablet 0  . morphine (MSIR) 15 MG tablet Take 1-2 tablets (15-30 mg total) by mouth every 4 (four) hours as needed for severe pain. 75 tablet 0  . prochlorperazine (COMPAZINE) 10 MG tablet Take 1 tablet (10 mg total) by mouth every 6 (six) hours as needed for nausea. 60 tablet 1  . tamsulosin (FLOMAX) 0.4 MG CAPS capsule Take 1 capsule (0.4 mg total) by mouth daily. 30 capsule 1  . dexamethasone (DECADRON) 4 MG tablet Take 2 tablets (8 mg total) by mouth 2 (two) times daily with a meal. Take day after chemo only (Patient not taking: Reported on 11/27/2014) 4 tablet 0  . ondansetron (ZOFRAN) 8 MG tablet Take 1 tablet (8 mg total) by  mouth every 8 (eight) hours as needed for nausea or vomiting. (Patient not taking: Reported on 10/31/2014) 20 tablet 0  . phenazopyridine (PYRIDIUM) 200 MG tablet Take 1 tablet (200 mg total) by mouth 3 (three) times daily with meals. (Patient not taking: Reported on 11/13/2014) 10 tablet 0  . polyethylene glycol (MIRALAX / GLYCOLAX) packet Take 17 g by mouth daily. (Patient not taking: Reported on 11/13/2014) 14 each 0  . senna-docusate (SENOKOT-S) 8.6-50 MG per tablet Take 1 tablet by mouth 2 (two) times daily. (Patient not taking: Reported on 11/27/2014)     Review Of Systems: 12 point ROS negative except as noted above in HPI.  Physical Exam: Filed Vitals:   11/28/14 0247  BP: 87/57  Pulse: 106  Temp: 98.9 F (37.2 C)  Resp: 14    General: alert and cooperative HEENT: PERRLA, extra ocular movement intact and dry oral mucosa  Heart: S1, S2 normal, no murmur, rub or gallop, regular rate and rhythm Lungs: clear to auscultation, no wheezes or rales and unlabored breathing Abdomen: + bowel sounds, mild abd TTP diffusely  Extremities: extremities normal, atraumatic, no cyanosis or edema Skin:no rashes Neurology: normal without focal findings  Labs and Imaging: Lab Results  Component Value Date/Time   NA 139 11/26/2014 01:24 PM   NA 141 09/05/2014 05:00 AM   K 4.5 11/26/2014 01:24 PM   K 4.2 09/05/2014 05:00 AM   CL 100 09/05/2014 05:00 AM   CO2 26 11/26/2014 01:24 PM   CO2 28 09/05/2014 05:00 AM   BUN 13.4 11/26/2014 01:24 PM   BUN 7 09/05/2014 05:00 AM   CREATININE 0.8 11/26/2014 01:24 PM   CREATININE 0.86 09/05/2014 05:00 AM   GLUCOSE 122 11/26/2014 01:24 PM   GLUCOSE 98 09/05/2014 05:00 AM   Lab Results  Component Value Date   WBC 7.3 11/28/2014   HGB 5.6* 11/28/2014   HCT 16.5* 11/28/2014   MCV 87.3 11/28/2014   PLT 248 11/28/2014    Dg Chest Port 1 View  11/28/2014   CLINICAL DATA:  Worsening generalized weakness and shortness of breath. Initial encounter.  EXAM:  PORTABLE CHEST - 1 VIEW  COMPARISON:  Chest radiograph performed 09/25/2014  FINDINGS: The lungs are well-aerated and clear. There is no evidence of focal opacification, pleural effusion or pneumothorax. Bilateral nipple shadows are seen.  The cardiomediastinal silhouette is within normal limits. A left-sided chest port is seen ending about the mid SVC. No acute osseous abnormalities are seen.  IMPRESSION: No acute cardiopulmonary process seen.   Electronically Signed   By: Garald Balding M.D.   On: 11/28/2014 00:53           Shanda Howells MD  Pager: [phone]

## 2014-11-28 NOTE — ED Notes (Signed)
Per EMS, patient is complaining of generalized weakness with shortness of breath. Symptoms started yesterday. Also, had 5-6 dark red bloody stools. Denies any vomiting. Took Phenergan at 22:00 to help with nausea.

## 2014-11-28 NOTE — ED Notes (Signed)
Dark red stools.

## 2014-11-28 NOTE — ED Provider Notes (Signed)
CSN: [id number]     Arrival date & time 11/28/14  0014 History   First MD Initiated Contact with Patient 11/28/14 0021     Chief Complaint  Patient presents with  . Weakness    Generalized   . Shortness of Breath  . Rectal Bleeding     (Consider location/radiation/quality/duration/timing/severity/associated sxs/prior Treatment) HPI Patient has history of adenocarcinoma of the pancreas for which he is receiving chemotherapy. States has had 5 bloody bowel movements today. Complains of lightheadedness and near syncope after getting up to go to the bathroom this evening. No head or neck injury. Patient complains of feeling weak all over. He has ongoing abdominal pain which is unchanged at this time. Patient denies fever or chills. Complains of mild dyspnea especially with exertion. Patient states the blood in the bowel movements have been dark in color. Was seen earlier this week at his oncologist and noted to have low blood pressure. Given fluid bolus. Past Medical History  Diagnosis Date  . Hemorrhoids   . Chronic pancreatitis   . Pancreatic cyst dx'd 08/03/2014  . History of gout   . Anxiety   . 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 HTN (hypertension)   . Wears glasses   . GERD (gastroesophageal reflux disease)    Past Surgical History  Procedure Laterality Date  . Colonoscopy    . Eus N/A 08/03/2014    Procedure: ESOPHAGEAL ENDOSCOPIC ULTRASOUND (EUS) RADIAL;  Surgeon: Arta Silence, MD;  Location: [place];  Service: Endoscopy;  Laterality: N/A;  H&P in file  . Eus N/A 09/04/2014    Procedure: ESOPHAGEAL ENDOSCOPIC ULTRASOUND (EUS) RADIAL;  Surgeon: Arta Silence, MD;  Location: [facility];  Service: Endoscopy;  Laterality: N/A;  . Fine needle aspiration N/A 09/04/2014    Procedure: FINE NEEDLE ASPIRATION (FNA) RADIAL;  Surgeon: Arta Silence, MD;  Location: [facility];  Service: Endoscopy;  Laterality: N/A;  . Portacath placement Left 09/25/2014    Procedure:  INSERTION PORT-A-CATH;  Surgeon: Stark Klein, MD;  Location: [place];  Service: General;  Laterality: Left;   History reviewed. No pertinent family history. History  Substance Use Topics  . Smoking status: Former Smoker -- 0.50 packs/day for 30 years    Types: Cigarettes    Quit date: 09/21/2011  . Smokeless tobacco: Never Used     Comment: "quit smoking  <2014"  . Alcohol Use: Yes     Comment: 08/31/2014 "stopped drinking 07/24/2014"    Review of Systems  Constitutional: Negative for fever and chills.  Respiratory: Positive for shortness of breath. Negative for cough.   Cardiovascular: Negative for chest pain.  Gastrointestinal: Positive for nausea, abdominal pain and blood in stool. Negative for vomiting and diarrhea.  Genitourinary: Positive for frequency. Negative for dysuria and flank pain.  Musculoskeletal: Negative for myalgias, back pain, neck pain and neck stiffness.  Skin: Negative for rash and wound.  Neurological: Positive for dizziness and weakness (generalized). Negative for numbness.  All other systems reviewed and are negative.     Allergies  Review of patient's allergies indicates no known allergies.  Home Medications   Prior to Admission medications   Medication Sig Start Date End Date Taking? Authorizing Provider  ALPRAZolam Duanne Moron) 1 MG tablet Take 1 tablet (1 mg total) by mouth at bedtime as needed for anxiety. 10/15/14  Yes Ladell Pier, MD  ibuprofen (ADVIL,MOTRIN) 200 MG tablet Take 800 mg by mouth every 8 (eight) hours as needed for moderate pain.  Yes Historical Provider, MD  lactose free nutrition (BOOST) LIQD Take 237 mLs by mouth 4 (four) times daily.   Yes Historical Provider, MD  lidocaine-prilocaine (EMLA) cream Apply 1 application topically as needed. Apply to [facility] site 1-2 hours prior to stick and cover with plastic wrap 09/21/14  Yes Ladell Pier, MD  morphine (MS CONTIN) 60 MG 12 hr tablet Take 1 tablet (60 mg total) by  mouth every 12 (twelve) hours. 11/05/14  Yes Owens Shark, NP  morphine (MSIR) 15 MG tablet Take 1-2 tablets (15-30 mg total) by mouth every 4 (four) hours as needed for severe pain. 11/13/14  Yes Owens Shark, NP  prochlorperazine (COMPAZINE) 10 MG tablet Take 1 tablet (10 mg total) by mouth every 6 (six) hours as needed for nausea. 09/21/14  Yes Ladell Pier, MD  tamsulosin (FLOMAX) 0.4 MG CAPS capsule Take 1 capsule (0.4 mg total) by mouth daily. 11/26/14  Yes Ladell Pier, MD  dexamethasone (DECADRON) 4 MG tablet Take 2 tablets (8 mg total) by mouth 2 (two) times daily with a meal. Take day after chemo only Patient not taking: Reported on 11/27/2014 10/10/14   Ladell Pier, MD  ondansetron (ZOFRAN) 8 MG tablet Take 1 tablet (8 mg total) by mouth every 8 (eight) hours as needed for nausea or vomiting. Patient not taking: Reported on 10/31/2014 09/08/14   Kinnie Feil, MD  phenazopyridine (PYRIDIUM) 200 MG tablet Take 1 tablet (200 mg total) by mouth 3 (three) times daily with meals. Patient not taking: Reported on 11/13/2014 10/22/14   Ladell Pier, MD  polyethylene glycol [facility] / Floria Raveling) packet Take 17 g by mouth daily. Patient not taking: Reported on 11/13/2014 09/08/14   Kinnie Feil, MD  senna-docusate (SENOKOT-S) 8.6-50 MG per tablet Take 1 tablet by mouth 2 (two) times daily. Patient not taking: Reported on 11/27/2014 09/08/14   Kinnie Feil, MD   BP 92/54 mmHg  Pulse 104  Temp(Src) 97.6 F (36.4 C) (Oral)  Resp 18  Ht 5\' 9"  (1.753 m)  Wt 163 lb (73.936 kg)  BMI 24.06 kg/m2  SpO2 100% Physical Exam  Constitutional: He is oriented to person, place, and time. He appears well-developed and well-nourished. No distress.  Pale appearing  HENT:  Head: Normocephalic and atraumatic.  Mouth/Throat: Oropharynx is clear and moist. No oropharyngeal exudate.  Eyes: EOM are normal. Pupils are equal, round, and reactive to light.  Neck: Normal range of motion. Neck  supple.  No posterior midline cervical tenderness to palpation.  Cardiovascular: Normal rate and regular rhythm.   Pulmonary/Chest: Effort normal and breath sounds normal. No respiratory distress. He has no wheezes. He has no rales.  Abdominal: Soft. Bowel sounds are normal. He exhibits no distension and no mass. There is tenderness (diffuse abdominal tenderness without focality). There is no rebound and no guarding.  Genitourinary:  Gross dark red blood on rectal exam. No hemorrhoids noted  Musculoskeletal: Normal range of motion. He exhibits no edema or tenderness.  No calf swelling or tenderness. Thready distal pulses  Neurological: He is alert and oriented to person, place, and time.  Moves all extremities without deficit. Sensation grossly intact.  Skin: Skin is warm and dry. No rash noted. No erythema. There is pallor.  Psychiatric: He has a normal mood and affect. His behavior is normal.  Nursing note and vitals reviewed.   ED Course  Procedures (including critical care time) Labs Review Labs Reviewed  CBC WITH DIFFERENTIAL -  Abnormal; Notable for the following:    RBC 1.89 (*)    Hemoglobin 5.6 (*)    HCT 16.5 (*)    Lymphocytes Relative 10 (*)    Monocytes Relative 15 (*)    Monocytes Absolute 1.1 (*)    All other components within normal limits  PROTIME-INR - Abnormal; Notable for the following:    Prothrombin Time 17.0 (*)    All other components within normal limits  POC OCCULT BLOOD, ED - Abnormal; Notable for the following:    Fecal Occult Bld POSITIVE (*)    All other components within normal limits  APTT  BRAIN NATRIURETIC PEPTIDE  LACTIC ACID, PLASMA  COMPREHENSIVE METABOLIC PANEL  LIPASE, BLOOD  TROPONIN I  OCCULT BLOOD X 1 CARD TO LAB, STOOL  TYPE AND SCREEN  ABO/RH  PREPARE RBC (CROSSMATCH)    Imaging Review Dg Chest Port 1 View  11/28/2014   CLINICAL DATA:  Worsening generalized weakness and shortness of breath. Initial encounter.  EXAM: PORTABLE  CHEST - 1 VIEW  COMPARISON:  Chest radiograph performed 09/25/2014  FINDINGS: The lungs are well-aerated and clear. There is no evidence of focal opacification, pleural effusion or pneumothorax. Bilateral nipple shadows are seen.  The cardiomediastinal silhouette is within normal limits. A left-sided chest port is seen ending about the mid SVC. No acute osseous abnormalities are seen.  IMPRESSION: No acute cardiopulmonary process seen.   Electronically Signed   By: Garald Balding M.D.   On: 11/28/2014 00:53     EKG Interpretation None      Date: 11/28/2014  Rate: 122  Rhythm: sinus tachycardia  QRS Axis: normal  Intervals: normal  ST/T Wave abnormalities: normal  Conduction Disutrbances:none  Narrative Interpretation:   Old EKG Reviewed: none available  CRITICAL CARE Performed by: Lita Mains, Garnell Phenix Total critical care time: 20 min Critical care time was exclusive of separately billable procedures and treating other patients. Critical care was necessary to treat or prevent imminent or life-threatening deterioration. Critical care was time spent personally by me on the following activities: development of treatment plan with patient and/or surrogate as well as nursing, discussions with consultants, evaluation of patient's response to treatment, examination of patient, obtaining history from patient or surrogate, ordering and performing treatments and interventions, ordering and review of laboratory studies, ordering and review of radiographic studies, pulse oximetry and re-evaluation of patient's condition.    MDM   Final diagnoses:  SOB (shortness of breath)    Patient with improved blood pressure and heart rate after initial IV fluid.  Discussed with intensivist. Recommends hospitalist admission.  Dr. Amedeo Plenty consultation for gastroenterology. Will see patient.  Dr Ernestina Patches will see pt in ED and admit  Julianne Rice, MD 11/28/14 [phone]

## 2014-11-28 NOTE — Consult Note (Signed)
[facility] Consult Note  Referring Provider: No ref. provider found Primary Care Physician:  Ned Card, NP Primary Gastroenterologist:  Dr.  Laurel Dimmer Complaint: Melena and 4 g drop in hemoglobin HPI: Richard Cantu is an 59 y.o. white male  with pancreatic carcinoma being treated with chemotherapy as a possible eventual candidate for surgery presented yesterday with lightheadedness and onset of melenic/maroon stools with some hypotension on presentation to the emergency room which responded to IV fluids. His hemoglobin was 5 down from 10 5 days ago. He had a melenic stool in the room when I was seeing him. He denies any abdominal pain. He is alert and oriented he is no history of upper GI bleeding and his pancreatic carcinoma apparently did not involve the head.  Past Medical History  Diagnosis Date  . Hemorrhoids   . Chronic pancreatitis   . Pancreatic cyst dx'd 08/03/2014  . History of gout   . Anxiety   . 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 HTN (hypertension)   . Wears glasses   . GERD (gastroesophageal reflux disease)     Past Surgical History  Procedure Laterality Date  . Colonoscopy    . Eus N/A 08/03/2014    Procedure: ESOPHAGEAL ENDOSCOPIC ULTRASOUND (EUS) RADIAL;  Surgeon: Arta Silence, MD;  Location: [place];  Service: Endoscopy;  Laterality: N/A;  H&P in file  . Eus N/A 09/04/2014    Procedure: ESOPHAGEAL ENDOSCOPIC ULTRASOUND (EUS) RADIAL;  Surgeon: Arta Silence, MD;  Location: [facility];  Service: Endoscopy;  Laterality: N/A;  . Fine needle aspiration N/A 09/04/2014    Procedure: FINE NEEDLE ASPIRATION (FNA) RADIAL;  Surgeon: Arta Silence, MD;  Location: [facility];  Service: Endoscopy;  Laterality: N/A;  . Portacath placement Left 09/25/2014    Procedure: INSERTION PORT-A-CATH;  Surgeon: Stark Klein, MD;  Location: [place];  Service: General;  Laterality: Left;    Medications Prior to Admission   Medication Sig Dispense Refill  . ALPRAZolam (XANAX) 1 MG tablet Take 1 tablet (1 mg total) by mouth at bedtime as needed for anxiety. 30 tablet 0  . ibuprofen (ADVIL,MOTRIN) 200 MG tablet Take 800 mg by mouth every 8 (eight) hours as needed for moderate pain.    Marland Kitchen lactose free nutrition (BOOST) LIQD Take 237 mLs by mouth 4 (four) times daily.    Marland Kitchen lidocaine-prilocaine (EMLA) cream Apply 1 application topically as needed. Apply to [facility] site 1-2 hours prior to stick and cover with plastic wrap 30 g 11  . morphine (MS CONTIN) 60 MG 12 hr tablet Take 1 tablet (60 mg total) by mouth every 12 (twelve) hours. 60 tablet 0  . morphine (MSIR) 15 MG tablet Take 1-2 tablets (15-30 mg total) by mouth every 4 (four) hours as needed for severe pain. 75 tablet 0  . prochlorperazine (COMPAZINE) 10 MG tablet Take 1 tablet (10 mg total) by mouth every 6 (six) hours as needed for nausea. 60 tablet 1  . tamsulosin (FLOMAX) 0.4 MG CAPS capsule Take 1 capsule (0.4 mg total) by mouth daily. 30 capsule 1  . dexamethasone (DECADRON) 4 MG tablet Take 2 tablets (8 mg total) by mouth 2 (two) times daily with a meal. Take day after chemo only (Patient not taking: Reported on 11/27/2014) 4 tablet 0  . ondansetron (ZOFRAN) 8 MG tablet Take 1 tablet (8 mg total) by mouth every 8 (eight) hours as needed for nausea or vomiting. (Patient not taking: Reported on 10/31/2014) 20 tablet 0  .  phenazopyridine (PYRIDIUM) 200 MG tablet Take 1 tablet (200 mg total) by mouth 3 (three) times daily with meals. (Patient not taking: Reported on 11/13/2014) 10 tablet 0  . polyethylene glycol (MIRALAX / GLYCOLAX) packet Take 17 g by mouth daily. (Patient not taking: Reported on 11/13/2014) 14 each 0  . senna-docusate (SENOKOT-S) 8.6-50 MG per tablet Take 1 tablet by mouth 2 (two) times daily. (Patient not taking: Reported on 11/27/2014)      Allergies: No Known Allergies  History reviewed. No pertinent family history.  Social History:  reports that  he quit smoking about 3 years ago. His smoking use included Cigarettes. He has a 15 pack-year smoking history. He has never used smokeless tobacco. He reports that he drinks alcohol. He reports that he does not use illicit drugs.  Review of Systems: negative except as above   Blood pressure 85/54, pulse 96, temperature 98.4 F (36.9 C), temperature source Oral, resp. rate 15, height _0  (1.753 m), weight 73.936 kg (163 lb), SpO2 100 %. Head: Normocephalic, without obvious abnormality, atraumatic Neck: no adenopathy, no carotid bruit, no JVD, supple, symmetrical, trachea midline and thyroid not enlarged, symmetric, no tenderness/mass/nodules Resp: clear to auscultation bilaterally Cardio: regular rate and rhythm, S1, S2 normal, no murmur, click, rub or gallop GI: Abdomen soft nondistended with normoactive bowel sounds. No hepatosplenomegaly mass or guarding Extremities: extremities normal, atraumatic, no cyanosis or edema  Results for orders placed or performed during the hospital encounter of 11/28/14 (from the past 48 hour(s))  CBC with Differential     Status: Abnormal   Collection Time: 11/28/14 12:43 AM  Result Value Ref Range   WBC 7.3 4.0 - 10.5 K/uL   RBC 1.89 (L) 4.22 - 5.81 MIL/uL   Hemoglobin 5.6 (LL) 13.0 - 17.0 g/dL    Comment: REPEATED TO VERIFY CRITICAL RESULT CALLED TO, READ BACK BY AND VERIFIED WITH: L.ADKINS,RN AT 1219 ON 11/28/14 BY SHEA.W    HCT 16.5 (L) 39.0 - 52.0 %   MCV 87.3 78.0 - 100.0 fL   MCH 29.6 26.0 - 34.0 pg   MCHC 33.9 30.0 - 36.0 g/dL   RDW 14.0 11.5 - 15.5 %   Platelets 248 150 - 400 K/uL   Neutrophils Relative % 75 43 - 77 %   Neutro Abs 5.5 1.7 - 7.7 K/uL   Lymphocytes Relative 10 (L) 12 - 46 %   Lymphs Abs 0.7 0.7 - 4.0 K/uL   Monocytes Relative 15 (H) 3 - 12 %   Monocytes Absolute 1.1 (H) 0.1 - 1.0 K/uL   Eosinophils Relative 0 0 - 5 %   Eosinophils Absolute 0.0 0.0 - 0.7 K/uL   Basophils Relative 0 0 - 1 %   Basophils Absolute 0.0 0.0 -  0.1 K/uL  Comprehensive metabolic panel     Status: Abnormal   Collection Time: 11/28/14 12:43 AM  Result Value Ref Range   Sodium 136 135 - 145 mmol/L    Comment: Please note change in reference range.   Potassium 3.6 3.5 - 5.1 mmol/L    Comment: Please note change in reference range.   Chloride 103 96 - 112 mEq/L   CO2 23 19 - 32 mmol/L   Glucose, Bld 177 (H) 70 - 99 mg/dL   BUN 19 6 - 23 mg/dL   Creatinine, Ser 0.56 0.50 - 1.35 mg/dL   Calcium 7.8 (L) 8.4 - 10.5 mg/dL   Total Protein 4.9 (L) 6.0 - 8.3 g/dL   Albumin 2.5 (  L) 3.5 - 5.2 g/dL   AST 18 0 - 37 U/L   ALT 17 0 - 53 U/L   Alkaline Phosphatase 48 39 - 117 U/L   Total Bilirubin 0.5 0.3 - 1.2 mg/dL   GFR calc non Af Amer >90 >90 mL/min   GFR calc Af Amer >90 >90 mL/min    Comment: (NOTE) The eGFR has been calculated using the CKD EPI equation. This calculation has not been validated in all clinical situations. eGFR's persistently <90 mL/min signify possible Chronic Kidney Disease.    Anion gap 10 5 - 15  Protime-INR     Status: Abnormal   Collection Time: 11/28/14 12:43 AM  Result Value Ref Range   Prothrombin Time 17.0 (H) 11.6 - 15.2 seconds   INR 1.37 0.00 - 1.49  APTT     Status: None   Collection Time: 11/28/14 12:43 AM  Result Value Ref Range   aPTT 35 24 - 37 seconds  Type and screen for Red Blood Exchange     Status: None (Preliminary result)   Collection Time: 11/28/14 12:43 AM  Result Value Ref Range   ABO/RH(D) A POS    Antibody Screen NEG    Sample Expiration 12/01/2014    Unit Number [id number]    Blood Component Type RED CELLS,LR    Unit division 00    Status of Unit ISSUED    Transfusion Status OK TO TRANSFUSE    Crossmatch Result Compatible    Unit Number [id number]    Blood Component Type RED CELLS,LR    Unit division 00    Status of Unit ISSUED    Transfusion Status OK TO TRANSFUSE    Crossmatch Result Compatible    Unit Number [id number]    Blood Component Type RED CELLS,LR     Unit division 00    Status of Unit ALLOCATED    Transfusion Status OK TO TRANSFUSE    Crossmatch Result Compatible    Unit Number [id number]    Blood Component Type RED CELLS,LR    Unit division 00    Status of Unit ALLOCATED    Transfusion Status OK TO TRANSFUSE    Crossmatch Result Compatible   Brain natriuretic peptide     Status: None   Collection Time: 11/28/14 12:43 AM  Result Value Ref Range   B Natriuretic Peptide 25.1 0.0 - 100.0 pg/mL    Comment: Please note change in reference range.  Lactic acid, plasma     Status: None   Collection Time: 11/28/14 12:43 AM  Result Value Ref Range   Lactic Acid, Venous 2.0 0.5 - 2.2 mmol/L  Troponin I     Status: None   Collection Time: 11/28/14 12:43 AM  Result Value Ref Range   Troponin I <0.03 <0.031 ng/mL    Comment:        NO INDICATION OF MYOCARDIAL INJURY. Please note change in reference range.   POC occult blood, ED     Status: Abnormal   Collection Time: 11/28/14 12:54 AM  Result Value Ref Range   Fecal Occult Bld POSITIVE (A) NEGATIVE  ABO/Rh     Status: None   Collection Time: 11/28/14  1:44 AM  Result Value Ref Range   ABO/RH(D) A POS   Prepare RBC     Status: None   Collection Time: 11/28/14  2:00 AM  Result Value Ref Range   Order Confirmation ORDER PROCESSED BY BLOOD BANK    Dg Chest Port 1 [id number]  11/28/2014   CLINICAL DATA:  Worsening generalized weakness and shortness of breath. Initial encounter.  EXAM: PORTABLE CHEST - 1 VIEW  COMPARISON:  Chest radiograph performed 09/25/2014  FINDINGS: The lungs are well-aerated and clear. There is no evidence of focal opacification, pleural effusion or pneumothorax. Bilateral nipple shadows are seen.  The cardiomediastinal silhouette is within normal limits. A left-sided chest port is seen ending about the mid SVC. No acute osseous abnormalities are seen.  IMPRESSION: No acute cardiopulmonary process seen.   Electronically Signed   By: Garald Balding M.D.   On: 11/28/2014  00:53    Assessment: Apparent upper GI bleed in a patient undergoing chemotherapy for pancreatic cancer which does not involve the head, platelet count good, status post 2 units packed red blood cells with hemodynamic stability Plan:  IV PPI and will plan EGD at 9:30 this morning. Maressa Apollo C 11/28/2014, 6:55 AM

## 2014-11-28 NOTE — ED Notes (Signed)
Bed: EN27 Expected date: 11/28/14 Expected time: 12:02 AM Means of arrival: Ambulance Comments: Hypertensive, weak

## 2014-11-28 NOTE — Progress Notes (Signed)
INITIAL NUTRITION ASSESSMENT  DOCUMENTATION CODES Per approved criteria  -Severe malnutrition in the context of chronic illness  Pt meets criteria for severe MALNUTRITION in the context of chronic ilness as evidenced by PO intake < 75% for > one month, 16% body weight loss in 3 months.   INTERVENTION: -Recommend Boost Plus TID w/FL diet advancement -Recommend addition of 8PM snack w/solid diet advancement -RD to continue to monitor  NUTRITION DIAGNOSIS: Inadequate oral intake related to taste changes/decreased appetite as evidenced by PO intake < 75%, 30 lb wt loss in 3 months.   Goal: Pt to meet >/= 90% of their estimated nutrition needs    Monitor:  Diet order, total protein/energy intake, labs, weights, education needs  Reason for Assessment: MST  59 y.o. male  Admitting Dx: <principal problem not specified>  ASSESSMENT: This is a 59 y.o. year old male with significant past medical history of pancreatic cancer on chemotherapy, HTN, GERD presenting with GIB, hypotension, acute blood loss anemia. Pt reports black stools over past 1-2 days. Has received 4 rounds of chemotherapy for pancreatic cancer since 07/2014 w/ improving markers and pain. Is due for next round of chemo 11/28/2014  -Pt in EGD during time of RD assessment; obtained information from pt's family in room -Family reported pt with ongoing varying appetite since start of chemo treatments. -Diet recall indicates pt consuming Boost High Protein supplements 3-4 times daily. Will snack on fruits in-between supplements. Family has been encouraging pt to eat when pt visits; however is unsure how well pt eats at home when he is alone -Endorsed ongoing weight loss. Usual body weight 190-200 lb; indicating a 16% body weight loss in 3 months (severe for time frame) -Was seen by outpatient oncology RD in 10/2014; was educated on taste changes, and high protein/kcal nutrition -Will follow up with education needs as diet  advancement tolerated; family noted pt may be agreeable to cheese and cracker snack along with Boost supplements TID  Height: Ht Readings from Last 1 Encounters:  11/28/14 5\' 9"  (1.753 m)    Weight: Wt Readings from Last 1 Encounters:  11/28/14 163 lb (73.936 kg)    Ideal Body Weight: 160 lb  % Ideal Body Weight: 102%  Wt Readings from Last 10 Encounters:  11/28/14 163 lb (73.936 kg)  11/27/14 163 lb 9.6 oz (74.208 kg)  11/13/14 165 lb 14.4 oz (75.252 kg)  10/31/14 163 lb 9.6 oz (74.208 kg)  10/24/14 161 lb 6.4 oz (73.211 kg)  10/10/14 169 lb 8 oz (76.885 kg)  09/25/14 178 lb (80.74 kg)  09/21/14 179 lb (81.194 kg)  09/17/14 178 lb 3.2 oz (80.831 kg)  08/30/14 184 lb 8 oz (83.689 kg)    Usual Body Weight: 190-200 lb  % Usual Body Weight: 85%  BMI:  Body mass index is 24.06 kg/(m^2).  Estimated Nutritional Needs: Kcal: 1696-7893 Protein: 115-130 gram Fluid: >/=2300 ml daily  Skin: WDL  Diet Order: Diet clear liquid  EDUCATION NEEDS: -Education needs addressed   Intake/Output Summary (Last 24 hours) at 11/28/14 1453 Last data filed at 11/28/14 1400  Gross per 24 hour  Intake   3420 ml  Output    850 ml  Net   2570 ml    Last BM: 1/06   Labs:   Recent Labs Lab 11/26/14 1324 11/28/14 0043 11/28/14 0825  NA 139 136 138  K 4.5 3.6 3.7  CL  --  103 109  CO2 26 23 24   BUN 13.4 19 16  CREATININE 0.8 0.56 0.45*  CALCIUM 9.6 7.8* 7.2*  GLUCOSE 122 177* 111*    CBG (last 3)  No results for input(s): GLUCAP in the last 72 hours.  Scheduled Meds: . sodium chloride  3 mL Intravenous Q12H    Continuous Infusions: . sodium chloride 125 mL/hr at 11/28/14 0758  . sodium chloride      Past Medical History  Diagnosis Date  . Hemorrhoids   . Chronic pancreatitis   . Pancreatic cyst dx'd 08/03/2014  . History of gout   . Anxiety   . 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 HTN (hypertension)   . Wears glasses   . GERD (gastroesophageal  reflux disease)     Past Surgical History  Procedure Laterality Date  . Colonoscopy    . Eus N/A 08/03/2014    Procedure: ESOPHAGEAL ENDOSCOPIC ULTRASOUND (EUS) RADIAL;  Surgeon: Arta Silence, MD;  Location: [facility];  Service: Endoscopy;  Laterality: N/A;  H&P in file  . Eus N/A 09/04/2014    Procedure: ESOPHAGEAL ENDOSCOPIC ULTRASOUND (EUS) RADIAL;  Surgeon: Arta Silence, MD;  Location: [facility];  Service: Endoscopy;  Laterality: N/A;  . Fine needle aspiration N/A 09/04/2014    Procedure: FINE NEEDLE ASPIRATION (FNA) RADIAL;  Surgeon: Arta Silence, MD;  Location: [facility];  Service: Endoscopy;  Laterality: N/A;  . Portacath placement Left 09/25/2014    Procedure: INSERTION PORT-A-CATH;  Surgeon: Stark Klein, MD;  Location: [facility];  Service: General;  Laterality: Left;    Atlee Abide MS RD LDN Clinical Dietitian [phone]

## 2014-11-28 NOTE — Op Note (Signed)
[facility] [place], [address]   ENDOSCOPY PROCEDURE REPORT  PATIENT: [name], [name]  MR#: [id number] BIRTHDATE: 04-24-1956 , 58  yrs. old GENDER: male ENDOSCOPIST: Acquanetta Sit, MD REFERRED BY: PROCEDURE DATE:  24-Dec-2014 PROCEDURE:  EGD ASA CLASS:     3 INDICATIONS:  gastrointestinal bleeding characterized by melena. Patient has a known history of pancreatic carcinoma MEDICATIONS: fentanyl 100 g IV, Versed 10 mg IV TOPICAL ANESTHETIC: Cetacaine spray  DESCRIPTION OF PROCEDURE: After the risks benefits and alternatives of the procedure were thoroughly explained, informed consent was obtained.  The Pentax Gastroscope [id number] endoscope was introduced through the mouth and advanced to the second portion of the duodenum , Without limitations.  The instrument was slowly withdrawn as the mucosa was fully examined.  Findings:  Esophagus: Normal  Stomach: Normal  Duodenum: There is a friable mass protruding into the lumen of the second portion of the duodenum as seen on images 5, 6, and 7. This would bleed just by touching it with the scope. This is consistent with invasion of pancreatic cancer into the duodenum .I did not biopsy this mass as he already has a biopsy-proven pancreatic carcinoma    The scope was then withdrawn from the patient and the procedure completed.  COMPLICATIONS: There were no immediate complications.  ENDOSCOPIC IMPRESSION:see above   RECOMMENDATIONS:supportive care. Transfuse as needed.   REPEAT EXAM:  eSignedAcquanetta Sit, MD December 24, 2014 10:41 AM    CC:  CPT CODES: ICD CODES:  The ICD and CPT codes recommended by this software are interpretations from the data that the clinical staff has captured with the software.  The verification of the translation of this report to the ICD and CPT codes and modifiers is the sole responsibility of the health care institution and practicing physician where this  report was generated.  [facility] will not be held responsible for the validity of the ICD and CPT codes included on this report.  AMA assumes no liability for data contained or not contained herein. CPT is a Designer, television/film set of the [employer].  PATIENT NAME:  Richard Cantu, Richard Cantu MR#: [id number]

## 2014-11-28 NOTE — ED Notes (Signed)
Patient signed [place] [employer]LOOD [employer]CTS TRANSFUSION. Witnessed patient signing.

## 2014-11-28 NOTE — Progress Notes (Signed)
59 year old male with h/o pancreatic cancer, comes in for GI bleed and hypotension. He received 3 units of prbc transfusion and underwent EGD today. He was found to have a friable mass, probably from invasion of the pancreatic cancer into the duodenum. Dr Learta Codding aware. Transfusions as needed.  Continue to monitor.   Hosie Poisson, MD [phone]

## 2014-11-28 NOTE — ED Notes (Signed)
Discontinued oxygen. Pt is maintaining at 1100%, denies short of breath.

## 2014-11-29 ENCOUNTER — Inpatient Hospital Stay (HOSPITAL_COMMUNITY): Payer: BLUE CROSS/BLUE SHIELD

## 2014-11-29 ENCOUNTER — Encounter (HOSPITAL_COMMUNITY): Payer: Self-pay | Admitting: Gastroenterology

## 2014-11-29 DIAGNOSIS — C258 Malignant neoplasm of overlapping sites of pancreas: Secondary | ICD-10-CM

## 2014-11-29 DIAGNOSIS — E41 Nutritional marasmus: Secondary | ICD-10-CM

## 2014-11-29 DIAGNOSIS — G893 Neoplasm related pain (acute) (chronic): Secondary | ICD-10-CM

## 2014-11-29 DIAGNOSIS — D649 Anemia, unspecified: Secondary | ICD-10-CM

## 2014-11-29 DIAGNOSIS — I959 Hypotension, unspecified: Secondary | ICD-10-CM

## 2014-11-29 LAB — COMPREHENSIVE METABOLIC PANEL
ALBUMIN: 2.5 g/dL — AB (ref 3.5–5.2)
ALK PHOS: 51 U/L (ref 39–117)
ALT: 16 U/L (ref 0–53)
ANION GAP: 7 (ref 5–15)
AST: 17 U/L (ref 0–37)
BUN: 5 mg/dL — AB (ref 6–23)
CO2: 24 mmol/L (ref 19–32)
Calcium: 7.7 mg/dL — ABNORMAL LOW (ref 8.4–10.5)
Chloride: 107 mEq/L (ref 96–112)
Creatinine, Ser: 0.41 mg/dL — ABNORMAL LOW (ref 0.50–1.35)
Glucose, Bld: 100 mg/dL — ABNORMAL HIGH (ref 70–99)
POTASSIUM: 3.4 mmol/L — AB (ref 3.5–5.1)
Sodium: 138 mmol/L (ref 135–145)
Total Bilirubin: 0.6 mg/dL (ref 0.3–1.2)
Total Protein: 5 g/dL — ABNORMAL LOW (ref 6.0–8.3)

## 2014-11-29 LAB — CBC WITH DIFFERENTIAL/PLATELET
BASOS ABS: 0 10*3/uL (ref 0.0–0.1)
Basophils Absolute: 0 10*3/uL (ref 0.0–0.1)
Basophils Relative: 0 % (ref 0–1)
Basophils Relative: 0 % (ref 0–1)
EOS ABS: 0.1 10*3/uL (ref 0.0–0.7)
EOS PCT: 1 % (ref 0–5)
EOS PCT: 1 % (ref 0–5)
Eosinophils Absolute: 0 10*3/uL (ref 0.0–0.7)
HCT: 26 % — ABNORMAL LOW (ref 39.0–52.0)
HEMATOCRIT: 23.5 % — AB (ref 39.0–52.0)
Hemoglobin: 8.8 g/dL — ABNORMAL LOW (ref 13.0–17.0)
Hemoglobin: 7.9 g/dL — ABNORMAL LOW (ref 13.0–17.0)
LYMPHS PCT: 13 % (ref 12–46)
Lymphocytes Relative: 15 % (ref 12–46)
Lymphs Abs: 0.8 10*3/uL (ref 0.7–4.0)
Lymphs Abs: 0.9 10*3/uL (ref 0.7–4.0)
MCH: 29.4 pg (ref 26.0–34.0)
MCH: 29.5 pg (ref 26.0–34.0)
MCHC: 33.8 g/dL (ref 30.0–36.0)
MCHC: 33.6 g/dL (ref 30.0–36.0)
MCV: 87 fL (ref 78.0–100.0)
MCV: 87.7 fL (ref 78.0–100.0)
MONO ABS: 0.8 10*3/uL (ref 0.1–1.0)
Monocytes Absolute: 0.9 10*3/uL (ref 0.1–1.0)
Monocytes Relative: 13 % — ABNORMAL HIGH (ref 3–12)
Monocytes Relative: 16 % — ABNORMAL HIGH (ref 3–12)
Neutro Abs: 4.5 10*3/uL (ref 1.7–7.7)
Neutro Abs: 4 10*3/uL (ref 1.7–7.7)
Neutrophils Relative %: 73 % (ref 43–77)
Neutrophils Relative %: 68 % (ref 43–77)
PLATELETS: 243 10*3/uL (ref 150–400)
Platelets: 238 10*3/uL (ref 150–400)
RBC: 2.99 MIL/uL — ABNORMAL LOW (ref 4.22–5.81)
RBC: 2.68 MIL/uL — AB (ref 4.22–5.81)
RDW: 14.3 % (ref 11.5–15.5)
RDW: 14.5 % (ref 11.5–15.5)
WBC: 6.2 10*3/uL (ref 4.0–10.5)
WBC: 5.9 10*3/uL (ref 4.0–10.5)

## 2014-11-29 LAB — CBC
HCT: 19.4 % — ABNORMAL LOW (ref 39.0–52.0)
HEMOGLOBIN: 6.7 g/dL — AB (ref 13.0–17.0)
MCH: 29.9 pg (ref 26.0–34.0)
MCHC: 34.5 g/dL (ref 30.0–36.0)
MCV: 86.6 fL (ref 78.0–100.0)
Platelets: 279 10*3/uL (ref 150–400)
RBC: 2.24 MIL/uL — AB (ref 4.22–5.81)
RDW: 14.3 % (ref 11.5–15.5)
WBC: 8.5 10*3/uL (ref 4.0–10.5)

## 2014-11-29 LAB — PREPARE RBC (CROSSMATCH)

## 2014-11-29 MED ORDER — ALPRAZOLAM 1 MG PO TABS
1.0000 mg | ORAL_TABLET | Freq: Every evening | ORAL | Status: DC | PRN
Start: 2014-11-29 — End: 2014-12-02
  Administered 2014-11-29 – 2014-11-30 (×2): 1 mg via ORAL
  Filled 2014-11-29 (×2): qty 1

## 2014-11-29 MED ORDER — CHLORHEXIDINE GLUCONATE 0.12 % MT SOLN
15.0000 mL | Freq: Two times a day (BID) | OROMUCOSAL | Status: DC
  Administered 2014-11-29 – 2014-12-05 (×11): 15 mL via OROMUCOSAL
  Filled 2014-11-29 (×14): qty 15

## 2014-11-29 MED ORDER — ALPRAZOLAM 1 MG PO TABS
1.0000 mg | ORAL_TABLET | Freq: Every evening | ORAL | Status: DC | PRN
  Filled 2014-11-29: qty 1

## 2014-11-29 MED ORDER — SODIUM CHLORIDE 0.9 % IV BOLUS (SEPSIS)
1000.0000 mL | Freq: Once | INTRAVENOUS | Status: AC
  Administered 2014-11-29: 1000 mL via INTRAVENOUS

## 2014-11-29 MED ORDER — CETYLPYRIDINIUM CHLORIDE 0.05 % MT LIQD
7.0000 mL | Freq: Two times a day (BID) | OROMUCOSAL | Status: DC
  Administered 2014-11-30 – 2014-12-04 (×5): 7 mL via OROMUCOSAL

## 2014-11-29 MED ORDER — HYDROMORPHONE HCL 2 MG/ML IJ SOLN
2.0000 mg | Freq: Once | INTRAMUSCULAR | Status: AC
  Administered 2014-11-29: 2 mg via INTRAVENOUS
  Filled 2014-11-29: qty 1

## 2014-11-29 MED ORDER — MORPHINE SULFATE ER 30 MG PO TBCR
60.0000 mg | EXTENDED_RELEASE_TABLET | Freq: Two times a day (BID) | ORAL | Status: DC
  Administered 2014-11-29 – 2014-12-03 (×8): 60 mg via ORAL
  Filled 2014-11-29 (×9): qty 2

## 2014-11-29 MED ORDER — ALPRAZOLAM 0.5 MG PO TABS: 0.5000 mg | ORAL_TABLET | Freq: Once | ORAL | Status: DC

## 2014-11-29 MED ORDER — TAMSULOSIN HCL 0.4 MG PO CAPS
0.4000 mg | ORAL_CAPSULE | Freq: Every day | ORAL | Status: DC
  Filled 2014-11-29: qty 1

## 2014-11-29 MED ORDER — HYDROMORPHONE HCL 2 MG/ML IJ SOLN
2.0000 mg | INTRAMUSCULAR | Status: DC | PRN
  Administered 2014-11-29 – 2014-12-04 (×28): 2 mg via INTRAVENOUS
  Filled 2014-11-29 (×30): qty 1

## 2014-11-29 MED ORDER — ONDANSETRON HCL 4 MG/2ML IJ SOLN
4.0000 mg | INTRAMUSCULAR | Status: DC | PRN
  Administered 2014-11-29 – 2014-11-30 (×3): 4 mg via INTRAVENOUS
  Filled 2014-11-29 (×4): qty 2

## 2014-11-29 MED ORDER — SODIUM CHLORIDE 0.9 % IV SOLN
Freq: Once | INTRAVENOUS | Status: AC
  Administered 2014-11-29: 23:00:00 via INTRAVENOUS

## 2014-11-29 MED ORDER — IOHEXOL 300 MG/ML  SOLN
100.0000 mL | Freq: Once | INTRAMUSCULAR | Status: AC | PRN
  Administered 2014-11-29: 100 mL via INTRAVENOUS

## 2014-11-29 MED ORDER — MORPHINE SULFATE 15 MG PO TABS
15.0000 mg | ORAL_TABLET | ORAL | Status: DC | PRN
  Administered 2014-11-29: 30 mg via ORAL
  Filled 2014-11-29: qty 2

## 2014-11-29 NOTE — Progress Notes (Signed)
CRITICAL VALUE ALERT  Critical value received:  Hgb 6.7  Date of notification:  11/29/2014  Time of notification:  0730  Critical value read back:Yes.    Nurse who received alert:  Gladys Damme, RN  MD notified (1st page):  Ruthell Rummage, NP  Time of first page:  [phone]   MD notified (2nd page):  Time of second page:  Responding MD:  Lamar Blinks  Time MD responded:  1950  1U PRBC ordered to be infused over 3hours

## 2014-11-29 NOTE — Progress Notes (Signed)
[facility] PROGRESS NOTE  Richard Cantu XAJ:[id number] DOB: 1956-10-20 DOA: 11/28/2014 PCP: Ned Card, NP Interim summary: 59 year old male with h/o pancreatic cancer, comes in for GI bleed and hypotension. He received 3 units of prbc transfusion and underwent EGD on 11/28/14. He was found to have a friable mass, probably from invasion of the pancreatic cancer into the duodenum.  Assessment/Plan: 1. GI bleed: Secondary to bleeding from the friable mass in the duodenum.  Stable H&H. Transfuse to keep hemoglobin greater than 7.    2. Hypotension: resolved.   3. Pancreatic cancer: Dr Learta Codding on board. CT abdomen and pelvis.    4. Anemia of blood loss: Stable after 3 units of prbc transfusion.   5. Hypokalemia: replete as needed.   Code Status: full code Family Communication: discussed with family at bedside Disposition Plan: pending.    Consultants:  Gastroenterology  oncology  Procedures:  EGD  Antibiotics:  NONE  HPI/Subjective: Pain not well controlled.   Objective: Filed Vitals:   11/29/14 1000  BP: 105/70  Pulse: 95  Temp:   Resp: 18    Intake/Output Summary (Last 24 hours) at 11/29/14 1049 Last data filed at 11/29/14 1000  Gross per 24 hour  Intake   4785 ml  Output   3300 ml  Net   1485 ml   Filed Weights   11/28/14 0021  Weight: 73.936 kg (163 lb)    Exam:   General:  Alert slightly drowsy  Cardiovascular: s1s2  Respiratory: ctab  Abdomen: soft mildly tender diffuse  Musculoskeletal: no pedal edema.   Data Reviewed: Basic Metabolic Panel:  Recent Labs Lab 11/26/14 1324 11/28/14 0043 11/28/14 0825 11/29/14 0430  NA 139 136 138 138  K 4.5 3.6 3.7 3.4*  CL  --  103 109 107  CO2 26 23 24 24   GLUCOSE 122 177* 111* 100*  BUN 13.4 19 16  5*  CREATININE 0.8 0.56 0.45* 0.41*  CALCIUM 9.6 7.8* 7.2* 7.7*   Liver Function Tests:  Recent Labs Lab 11/26/14 1324 11/28/14 0043 11/28/14 0825 11/29/14 0430  AST 17  18 15 17   ALT 23 17 15 16   ALKPHOS 81 48 44 51  BILITOT 0.59 0.5 0.5 0.6  PROT 6.4 4.9* 4.4* 5.0*  ALBUMIN 3.1* 2.5* 2.2* 2.5*    Recent Labs Lab 11/28/14 0043  LIPASE 660*   No results for input(s): AMMONIA in the last 168 hours. CBC:  Recent Labs Lab 11/26/14 1324 11/28/14 0043 11/28/14 0825 11/28/14 1510 11/29/14 0430  WBC 9.3 7.3 5.3 5.9 6.2  NEUTROABS 6.8* 5.5 3.4 4.0 4.5  HGB 10.0* 5.6* 7.2* 8.4* 8.8*  HCT 30.5* 16.5* 21.3* 25.0* 26.0*  MCV 88.0 87.3 86.9 87.1 87.0  PLT 354 248 200 222 238   Cardiac Enzymes:  Recent Labs Lab 11/28/14 0043  TROPONINI <0.03   BNP (last 3 results) No results for input(s): PROBNP in the last 8760 hours. CBG: No results for input(s): GLUCAP in the last 168 hours.  Recent Results (from the past 240 hour(s))  MRSA PCR Screening     Status: None   Collection Time: 11/28/14  6:42 AM  Result Value Ref Range Status   MRSA by PCR NEGATIVE NEGATIVE Final    Comment:        The GeneXpert MRSA Assay (FDA approved for NASAL specimens only), is one component of a comprehensive MRSA colonization surveillance program. It is not intended to diagnose MRSA infection nor to guide or monitor treatment for  MRSA infections.      Studies: Dg Chest Port 1 View  11/28/2014   CLINICAL DATA:  Worsening generalized weakness and shortness of breath. Initial encounter.  EXAM: PORTABLE CHEST - 1 VIEW  COMPARISON:  Chest radiograph performed 09/25/2014  FINDINGS: The lungs are well-aerated and clear. There is no evidence of focal opacification, pleural effusion or pneumothorax. Bilateral nipple shadows are seen.  The cardiomediastinal silhouette is within normal limits. A left-sided chest port is seen ending about the mid SVC. No acute osseous abnormalities are seen.  IMPRESSION: No acute cardiopulmonary process seen.   Electronically Signed   By: Garald Balding M.D.   On: 11/28/2014 00:53    Scheduled Meds: . ALPRAZolam  0.5 mg Oral Once  .  antiseptic oral rinse  7 mL Mouth Rinse q12n4p  . chlorhexidine  15 mL Mouth Rinse BID  . morphine  60 mg Oral Q12H  . sodium chloride  3 mL Intravenous Q12H  . tamsulosin  0.4 mg Oral QPC supper   Continuous Infusions: . sodium chloride 125 mL/hr at 11/29/14 0903  . sodium chloride      Active Problems:   GIB (gastrointestinal bleeding)   Protein-calorie malnutrition, severe   Severe malnutrition    Time spent: 25 min    [facility]  [facility] Pager [phone]. If 7PM-7AM, please contact night-coverage at [email or web address], password [facility] 11/29/2014, 10:49 AM  LOS: 1 day

## 2014-11-29 NOTE — Progress Notes (Addendum)
Around 1630, pt become very nauseous and sweaty. Order for Zofran received from MD and given. Pt was also in pain so PRN dose of Dilaudid was given at 1645. Around 1730, Pt became hypotensive with SBP in the 60s. He became tachycardic in the 110s-120s, he was visibly pale and felt a bit dizzy. Pt had tolerated Dilaudid throughout shift. MD ordered 1L NS bolus and a stat CBC. Bolus was given with well BP response with SBP in the 110s-120s. After CBC was drawn, pt had a large bloody BM with blood clots. Pts vitals remained stable throughout. CBC Hgb came back as 6.7, down from 7.9 at 1430 earlier today. NP paged to night shift RN for critical Hgb level and response is pending.   1 unit of PRBC ordered for Hgb of 6.7. Will continue to monitor. Luther Parody, RN

## 2014-11-29 NOTE — Progress Notes (Signed)
IP PROGRESS NOTE  Subjective:   He is well-known to me with a history of locally advanced pancreas cancer. He was admitted 11/28/2013 with anemia and hypotension. He had gastrointestinal bleeding and was taken to an upper endoscopy on 11/28/2013. A friable mass was noted in the lumen of the second portion of the duodenum. The mass was bleeding. This was felt to be consistent with pancreas cancer invading the duodenum.  He was transfused with packed red blood cells. He reports no bowel movement overnight. Richard Cantu complains of increased mid abdominal pain today. The pain was relieved with IV Dilaudid this morning.  Objective: Vital signs in last 24 hours: Blood pressure 100/73, pulse 95, temperature 98.4 F (36.9 C), temperature source Oral, resp. rate 14, height 5' 9"  (1.753 m), weight 163 lb (73.936 kg), SpO2 97 %.  Intake/Output from previous day: 01/06 0701 - 01/07 0700 In: 4545 [P.O.:840; I.V.:3375; Blood:330] Out: 3150 [Urine:3150]  Physical Exam: Lungs: Clear bilaterally Cardiac: Regular rate and rhythm Abdomen: Mildly distended, no hepatomegaly, no mass, nontender Extremities: No leg edema   Portacath/PICC-without erythema  Lab Results:  Recent Labs  11/28/14 1510 11/29/14 0430  WBC 5.9 6.2  HGB 8.4* 8.8*  HCT 25.0* 26.0*  PLT 222 238    BMET  Recent Labs  11/28/14 0825 11/29/14 0430  NA 138 138  K 3.7 3.4*  CL 109 107  CO2 24 24  GLUCOSE 111* 100*  BUN 16 5*  CREATININE 0.45* 0.41*  CALCIUM 7.2* 7.7*    Studies/Results: Dg Chest Port 1 View  11/28/2014   CLINICAL DATA:  Worsening generalized weakness and shortness of breath. Initial encounter.  EXAM: PORTABLE CHEST - 1 VIEW  COMPARISON:  Chest radiograph performed 09/25/2014  FINDINGS: The lungs are well-aerated and clear. There is no evidence of focal opacification, pleural effusion or pneumothorax. Bilateral nipple shadows are seen.  The cardiomediastinal silhouette is within normal limits. A  left-sided chest port is seen ending about the mid SVC. No acute osseous abnormalities are seen.  IMPRESSION: No acute cardiopulmonary process seen.   Electronically Signed   By: Garald Balding M.D.   On: 11/28/2014 00:53    Medications: I have reviewed the patient's current medications.  Assessment/Plan:  1. 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  Cycle 3 FOLFIRINOX 10/31/2014.  CA-19-9 improved (33.4) on 11/13/2014.  Cycle 4 FOLFIRINOX 11/13/2014.  CA-19-9 improved (30.5) 11/26/2014. 2. Pain secondary to #1, improved.  3. Psoriasis   4. Acute nausea and vomiting with cycle 1 FOLFIRINOX-emend and prophylactic Decadron were added with cycle 2, improved.  5. Urinary hesitancy-this is most likely related to narcotic analgesics, chemotherapy, and prostatic hypertrophy. 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 Improved with Flomax.  6. Anorexia/weight loss secondary to #1  7. Admission with hypotension and severe anemia secondary to upper gastrointestinal bleeding on 11/28/2013   Upper endoscopy 11/28/2013 confirmed a friable mass in the duodenum consistent with pancreas cancer invading the duodenum   He was admitted with acute upper GI bleeding secondary to pancreas cancer involving the duodenum. The hemoglobin has stabilized over the past day. It is unclear whether the bleeding tumor indicates disease progression. He has completed 4 cycles of FOLFIRINOX with improvement in abdominal pain and the CA  19-9. I met with the patient and multiple family members this morning. I have discussed the case with Dr. Barry Dienes. The plan is to proceed with a  restaging CT evaluation to see whether he is a candidate for resection of the pancreas tumor. If not we will consider palliative chemotherapy and radiation.   Recommendations: 1. Resume outpatient narcotic analgesic regimen 2. Pancreas protocol CT of the abdomen/pelvis 3. Continue follow-up with a hemoglobin and management of gastrointestinal bleeding per Dr. Penelope Coop 4. Resume Flomax  I will see him again on 11/29/2013 and outpatient follow-up will be scheduled at the [facility].  LOS: 1 day   Richard Cantu  11/29/2014, 2:13 PM

## 2014-11-29 NOTE — Progress Notes (Signed)
[facility] Progress Note  Subjective: No major complaints today. He has not had a bowel movement since last night. No hematemesis. He states that Dr. Benay Spice came in to see him.  Objective: Vital signs in last 24 hours: Temp:  [98 F (36.7 C)-98.8 F (37.1 C)] 98.8 F (37.1 C) (01/07 0800) Pulse Rate:  [83-105] 102 (01/07 0900) Resp:  [11-20] 17 (01/07 0900) BP: (78-163)/(51-95) 117/82 mmHg (01/07 0800) SpO2:  [95 %-100 %] 99 % (01/07 0900) Weight change:    PE:  No distress  Heart regular rhythm no murmurs  Lungs clear  Abdomen: Bowel sounds present, soft, nontender  Lab Results: Results for orders placed or performed during the hospital encounter of 11/28/14 (from the past 24 hour(s))  Prepare RBC     Status: None   Collection Time: 11/28/14 12:00 PM  Result Value Ref Range   Order Confirmation ORDER PROCESSED BY BLOOD BANK   CBC WITH DIFFERENTIAL     Status: Abnormal   Collection Time: 11/28/14  3:10 PM  Result Value Ref Range   WBC 5.9 4.0 - 10.5 K/uL   RBC 2.87 (L) 4.22 - 5.81 MIL/uL   Hemoglobin 8.4 (L) 13.0 - 17.0 g/dL   HCT 25.0 (L) 39.0 - 52.0 %   MCV 87.1 78.0 - 100.0 fL   MCH 29.3 26.0 - 34.0 pg   MCHC 33.6 30.0 - 36.0 g/dL   RDW 14.2 11.5 - 15.5 %   Platelets 222 150 - 400 K/uL   Neutrophils Relative % 68 43 - 77 %   Neutro Abs 4.0 1.7 - 7.7 K/uL   Lymphocytes Relative 15 12 - 46 %   Lymphs Abs 0.9 0.7 - 4.0 K/uL   Monocytes Relative 16 (H) 3 - 12 %   Monocytes Absolute 0.9 0.1 - 1.0 K/uL   Eosinophils Relative 1 0 - 5 %   Eosinophils Absolute 0.0 0.0 - 0.7 K/uL   Basophils Relative 0 0 - 1 %   Basophils Absolute 0.0 0.0 - 0.1 K/uL  CBC WITH DIFFERENTIAL     Status: Abnormal   Collection Time: 11/29/14  4:30 AM  Result Value Ref Range   WBC 6.2 4.0 - 10.5 K/uL   RBC 2.99 (L) 4.22 - 5.81 MIL/uL   Hemoglobin 8.8 (L) 13.0 - 17.0 g/dL   HCT 26.0 (L) 39.0 - 52.0 %   MCV 87.0 78.0 - 100.0 fL   MCH 29.4 26.0 - 34.0 pg   MCHC 33.8 30.0  - 36.0 g/dL   RDW 14.3 11.5 - 15.5 %   Platelets 238 150 - 400 K/uL   Neutrophils Relative % 73 43 - 77 %   Neutro Abs 4.5 1.7 - 7.7 K/uL   Lymphocytes Relative 13 12 - 46 %   Lymphs Abs 0.8 0.7 - 4.0 K/uL   Monocytes Relative 13 (H) 3 - 12 %   Monocytes Absolute 0.8 0.1 - 1.0 K/uL   Eosinophils Relative 1 0 - 5 %   Eosinophils Absolute 0.0 0.0 - 0.7 K/uL   Basophils Relative 0 0 - 1 %   Basophils Absolute 0.0 0.0 - 0.1 K/uL  Comprehensive metabolic panel     Status: Abnormal   Collection Time: 11/29/14  4:30 AM  Result Value Ref Range   Sodium 138 135 - 145 mmol/L   Potassium 3.4 (L) 3.5 - 5.1 mmol/L   Chloride 107 96 - 112 mEq/L   CO2 24 19 - 32 mmol/L   Glucose, Bld  100 (H) 70 - 99 mg/dL   BUN 5 (L) 6 - 23 mg/dL   Creatinine, Ser 0.41 (L) 0.50 - 1.35 mg/dL   Calcium 7.7 (L) 8.4 - 10.5 mg/dL   Total Protein 5.0 (L) 6.0 - 8.3 g/dL   Albumin 2.5 (L) 3.5 - 5.2 g/dL   AST 17 0 - 37 U/L   ALT 16 0 - 53 U/L   Alkaline Phosphatase 51 39 - 117 U/L   Total Bilirubin 0.6 0.3 - 1.2 mg/dL   GFR calc non Af Amer >90 >90 mL/min   GFR calc Af Amer >90 >90 mL/min   Anion gap 7 5 - 15    Studies/Results: Dg Chest Port 1 View  11/28/2014   CLINICAL DATA:  Worsening generalized weakness and shortness of breath. Initial encounter.  EXAM: PORTABLE CHEST - 1 VIEW  COMPARISON:  Chest radiograph performed 09/25/2014  FINDINGS: The lungs are well-aerated and clear. There is no evidence of focal opacification, pleural effusion or pneumothorax. Bilateral nipple shadows are seen.  The cardiomediastinal silhouette is within normal limits. A left-sided chest port is seen ending about the mid SVC. No acute osseous abnormalities are seen.  IMPRESSION: No acute cardiopulmonary process seen.   Electronically Signed   By: Garald Balding M.D.   On: 11/28/2014 00:53      Assessment: Pancreatic carcinoma  Upper GI bleed secondary to erosion of pancreatic carcinoma into the second portion of the  duodenum  Plan: Watch for further signs of bleeding. Transfuse as needed. Monitor hemoglobin and hematocrit. CT scan of abdomen is being done.    Wonda Horner 11/29/2014, 9:41 AM  Lab Results  Component Value Date   HGB 8.8* 11/29/2014   HGB 8.4* 11/28/2014   HGB 7.2* 11/28/2014   HGB 10.0* 11/26/2014   HGB 11.0* 11/13/2014   HGB 11.7* 10/31/2014   HCT 26.0* 11/29/2014   HCT 25.0* 11/28/2014   HCT 21.3* 11/28/2014   HCT 30.5* 11/26/2014   HCT 32.9* 11/13/2014   HCT 34.9* 10/31/2014   ALKPHOS 51 11/29/2014   ALKPHOS 44 11/28/2014   ALKPHOS 48 11/28/2014   ALKPHOS 81 11/26/2014   ALKPHOS 86 11/13/2014   ALKPHOS 84 10/31/2014   AST 17 11/29/2014   AST 15 11/28/2014   AST 18 11/28/2014   AST 17 11/26/2014   AST 16 11/13/2014   AST 15 10/31/2014   ALT 16 11/29/2014   ALT 15 11/28/2014   ALT 17 11/28/2014   ALT 23 11/26/2014   ALT 25 11/13/2014   ALT 28 10/31/2014

## 2014-11-30 ENCOUNTER — Ambulatory Visit: Payer: BLUE CROSS/BLUE SHIELD | Admitting: Radiation Oncology

## 2014-11-30 ENCOUNTER — Inpatient Hospital Stay (HOSPITAL_COMMUNITY): Payer: BLUE CROSS/BLUE SHIELD

## 2014-11-30 DIAGNOSIS — R112 Nausea with vomiting, unspecified: Secondary | ICD-10-CM

## 2014-11-30 DIAGNOSIS — R3911 Hesitancy of micturition: Secondary | ICD-10-CM

## 2014-11-30 LAB — COMPREHENSIVE METABOLIC PANEL
ALT: 15 U/L (ref 0–53)
AST: 20 U/L (ref 0–37)
Albumin: 1.9 g/dL — ABNORMAL LOW (ref 3.5–5.2)
Alkaline Phosphatase: 39 U/L (ref 39–117)
Anion gap: 7 (ref 5–15)
BILIRUBIN TOTAL: 0.7 mg/dL (ref 0.3–1.2)
BUN: 12 mg/dL (ref 6–23)
CO2: 22 mmol/L (ref 19–32)
Calcium: 7 mg/dL — ABNORMAL LOW (ref 8.4–10.5)
Chloride: 108 mEq/L (ref 96–112)
Creatinine, Ser: 0.49 mg/dL — ABNORMAL LOW (ref 0.50–1.35)
GFR calc Af Amer: >90 mL/min (ref >90)
GFR calc non Af Amer: >90 mL/min (ref >90)
Glucose, Bld: 137 mg/dL — ABNORMAL HIGH (ref 70–99)
POTASSIUM: 3.6 mmol/L (ref 3.5–5.1)
Sodium: 137 mmol/L (ref 135–145)
TOTAL PROTEIN: 3.8 g/dL — AB (ref 6.0–8.3)

## 2014-11-30 LAB — CBC WITH DIFFERENTIAL/PLATELET
Basophils Absolute: 0 10*3/uL (ref 0.0–0.1)
Basophils Relative: 0 % (ref 0–1)
EOS ABS: 0 10*3/uL (ref 0.0–0.7)
Eosinophils Relative: 0 % (ref 0–5)
HEMATOCRIT: 15.9 % — AB (ref 39.0–52.0)
HEMOGLOBIN: 5.4 g/dL — AB (ref 13.0–17.0)
LYMPHS PCT: 14 % (ref 12–46)
Lymphs Abs: 1 10*3/uL (ref 0.7–4.0)
MCH: 29.8 pg (ref 26.0–34.0)
MCHC: 34 g/dL (ref 30.0–36.0)
MCV: 87.8 fL (ref 78.0–100.0)
Monocytes Absolute: 0.8 10*3/uL (ref 0.1–1.0)
Monocytes Relative: 11 % (ref 3–12)
Neutro Abs: 5.3 10*3/uL (ref 1.7–7.7)
Neutrophils Relative %: 75 % (ref 43–77)
PLATELETS: 222 10*3/uL (ref 150–400)
RBC: 1.81 MIL/uL — ABNORMAL LOW (ref 4.22–5.81)
RDW: 14.9 % (ref 11.5–15.5)
WBC: 7.1 10*3/uL (ref 4.0–10.5)

## 2014-11-30 LAB — PREPARE RBC (CROSSMATCH)

## 2014-11-30 LAB — HEMOGLOBIN AND HEMATOCRIT, BLOOD
HEMATOCRIT: 17.8 % — AB (ref 39.0–52.0)
Hemoglobin: 6.1 g/dL — CL (ref 13.0–17.0)

## 2014-11-30 MED ORDER — HYDROMORPHONE HCL 2 MG/ML IJ SOLN
INTRAMUSCULAR | Status: AC
  Filled 2014-11-30: qty 1

## 2014-11-30 MED ORDER — ALTEPLASE 2 MG IJ SOLR
2.0000 mg | Freq: Once | INTRAMUSCULAR | Status: AC
  Administered 2014-11-30: 2 mg
  Filled 2014-11-30: qty 2

## 2014-11-30 MED ORDER — MIDAZOLAM HCL 2 MG/2ML IJ SOLN
INTRAMUSCULAR | Status: AC | PRN
  Administered 2014-11-30 (×4): 0.5 mg via INTRAVENOUS

## 2014-11-30 MED ORDER — SODIUM CHLORIDE 0.9 % IV BOLUS (SEPSIS)
1000.0000 mL | Freq: Once | INTRAVENOUS | Status: AC
  Administered 2014-11-30: 1000 mL via INTRAVENOUS

## 2014-11-30 MED ORDER — MIDAZOLAM HCL 2 MG/2ML IJ SOLN
INTRAMUSCULAR | Status: AC
  Filled 2014-11-30: qty 6

## 2014-11-30 MED ORDER — PROMETHAZINE HCL 25 MG/ML IJ SOLN
12.5000 mg | Freq: Once | INTRAMUSCULAR | Status: AC
  Administered 2014-11-30: 12.5 mg via INTRAVENOUS
  Filled 2014-11-30: qty 1

## 2014-11-30 MED ORDER — IOHEXOL 300 MG/ML  SOLN
80.0000 mL | Freq: Once | INTRAMUSCULAR | Status: AC | PRN
  Administered 2014-11-30: 145 mL via INTRA_ARTERIAL

## 2014-11-30 MED ORDER — HYDROMORPHONE HCL 1 MG/ML IJ SOLN
INTRAMUSCULAR | Status: AC | PRN
  Administered 2014-11-30 (×3): 0.5 mg via INTRAVENOUS

## 2014-11-30 MED ORDER — FENTANYL CITRATE 0.05 MG/ML IJ SOLN
INTRAMUSCULAR | Status: AC | PRN
Start: 2014-11-30 — End: 2014-11-30
  Administered 2014-11-30 (×5): 25 ug via INTRAVENOUS

## 2014-11-30 MED ORDER — SODIUM CHLORIDE 0.9 % IV SOLN: Freq: Once | INTRAVENOUS | Status: DC

## 2014-11-30 MED ORDER — LIDOCAINE HCL 1 % IJ SOLN
INTRAMUSCULAR | Status: AC
  Filled 2014-11-30: qty 20

## 2014-11-30 MED ORDER — FENTANYL CITRATE 0.05 MG/ML IJ SOLN
INTRAMUSCULAR | Status: AC
  Filled 2014-11-30: qty 4

## 2014-11-30 NOTE — Progress Notes (Signed)
CRITICAL VALUE ALERT  Critical value received:  Hgb 5.4  Date of notification:  11/30/2014  Time of notification:  0618  Critical value read back:Yes.    Nurse who received alert:  Jacolyn Reedy, RN  MD notified (1st page):  Raliegh Ip Schorr  Time of first page:  0622  MD notified (2nd page):  Time of second page:  Responding MD:  Lamar Blinks  Time MD responded:  [phone]

## 2014-11-30 NOTE — Progress Notes (Signed)
IP PROGRESS NOTE  Subjective:  He had multiple bloody stools during the night. He felt weak and nausea. No difficulty with urination. The pain has improved since he resumed MS Contin and began IV Dilaudid.   Objective: Vital signs in last 24 hours: Blood pressure 131/78, pulse 104, temperature 98.3 F (36.8 C), temperature source Oral, resp. rate 11, height 5\' 9"  (1.753 m), weight 163 lb (73.936 kg), SpO2 100 %.  Intake/Output from previous day: 01/07 0701 - 01/08 0700 In: 3428 [P.O.:1080; I.V.:2013; Blood:335] Out: 9983 [Urine:1550]  Physical Exam: Lungs: Clear bilaterally Cardiac: Regular rate and rhythm Abdomen: Mildly distended, no hepatomegaly, no mass, nontender Extremities: No leg edema   Portacath/PICC-without erythema  Lab Results:  Recent Labs  11/29/14 1845 11/30/14 0549  WBC 8.5 7.1  HGB 6.7* 5.4*  HCT 19.4* 15.9*  PLT 279 222    BMET  Recent Labs  11/29/14 0430 11/30/14 0400  NA 138 137  K 3.4* 3.6  CL 107 108  CO2 24 22  GLUCOSE 100* 137*  BUN 5* 12  CREATININE 0.41* 0.49*  CALCIUM 7.7* 7.0*    Studies/Results: Ct Abdomen Pelvis W Wo Contrast  11/29/2014   CLINICAL DATA:  59 year old male with history of pancreatic cancer diagnosed in October 2015. Abdominal pain. History of chronic pancreatitis. Assess for potential resectability of the mass.  EXAM: CT ABDOMEN AND PELVIS WITHOUT AND WITH CONTRAST  TECHNIQUE: Multidetector CT imaging of the abdomen and pelvis was performed following the standard protocol before and following the bolus administration of intravenous contrast.  CONTRAST:  150mL OMNIPAQUE IOHEXOL 300 MG/ML  SOLN  COMPARISON:  CT of the abdomen and pelvis 08/30/2014.  FINDINGS: Lower chest: Areas of mild scarring or subsegmental atelectasis are noted in the lung bases bilaterally. Small amount of right-sided pleural thickening.  Hepatobiliary: Compared to the prior examination there are now numerous hypovascular hepatic lesions,  compatible with widespread metastatic disease to the liver. The largest of these measure up to 3.5 x 1.8 cm in segment 7. Mild periportal edema. Mild intra and extrahepatic biliary ductal dilatation, with common bile duct measuring up to 9 mm in the porta hepatis. Gallbladder appears moderately distended, but is otherwise unremarkable in appearance.  Pancreas: The previously described mass in the head and uncinate process of the pancreas has markedly enlarged and is now centrally necrotic. There is an air-fluid level within the centrally necrotic portion of the mass, which suggests probable erosion of the lesion into the adjacent third portion of the duodenum. This mass is irregular in shape, and therefore difficult to measure, but is estimated to measure approximately 5.7 x 9.5 x 6.0 cm on today's examination. This mass now completely encircles the proximal superior mesenteric artery which is markedly narrowed. The mass comes in close contact with the posterior aspect of the superior mesenteric vein over approximately 1/3 of the circumference of the vessel, and is intimately associated with the splenoportal confluence, immediately beneath which the superior mesenteric vein is very narrowed and nearly completely occluded, best appreciated on coronal image 51. The splenic vein remains patent at this time, as does the portal vein. Posterior extension of the mass is now causing some compression of the left renal vein which appears markedly narrowed. There is also obscuration of the fat plane between the mass and the adjacent inferior vena cava, best appreciated on axial image 73 of series 3, and coronal image 79.  Spleen: Unremarkable.  Adrenals/Urinary Tract: Bilateral adrenal glands and bilateral kidneys are normal in  appearance. No hydroureteronephrosis. Urinary bladder is normal in appearance.  Stomach/Bowel: Normal appearance of the stomach. 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  Vascular/Lymphatic: Numerous prominent peripancreatic lymph nodes are noted, borderline enlarged and mildly enlarged (up to 12 mm in short axis), presumably metastatic. Other enlarged retroperitoneal lymph nodes measuring up to 21 x 26 mm in the left para-aortic station (image 40 of series 5). Vascular findings relevant to be pancreatic lesion, as above. Atherosclerosis throughout the abdominal and pelvic vasculature, without evidence of aneurysm or dissection.  Reproductive: Prostate gland and seminal vesicles are unremarkable in appearance.  Other: Small volume of ascites.  No pneumoperitoneum.  Musculoskeletal: There are no aggressive appearing lytic or blastic lesions noted in the visualized portions of the skeleton.  IMPRESSION: 1. Today's study demonstrates progression of disease with marked enlargement of what is now a 5.7 x 9.5 x 6.0 cm centrally necrotic pancreatic mass which has likely invaded into the third portion of the duodenum, with extensive peripancreatic and retroperitoneal lymphadenopathy and numerous new hepatic metastasis, as above. There is vascular involvement, as detailed above. Additionally, this mass appears to be causing mild biliary tract obstruction. 2. Additional incidental findings, as above. These results were called by telephone at the time of interpretation on 11/29/2014 at 2:35 pm to Dr. Betsy Coder , who verbally acknowledged these results.   Electronically Signed   By: Vinnie Langton M.D.   On: 11/29/2014 14:41    Medications: I have reviewed the patient's current medications.  Assessment/Plan:  1. 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  CT scans 07/31/2014 and 08/30/2014 concerning for  involvement of the superior mesenteric artery  Cycle 1 FOLFIRINOX 09/26/2014  Cycle 2 FOLFIRINOX 10/10/2014  CA 19-9 improved (55.5) on 10/24/2014.  Cycle 3 FOLFIRINOX 10/31/2014.  CA-19-9 improved (33.4) on 11/13/2014.  Cycle 4 FOLFIRINOX 11/13/2014.  CA-19-9 improved (30.5) 11/26/2014.  Restaging CT 11/29/2014 consistent with progression of the primary pancreas tumor with invasion of the duodenum, respiratory lymphadenopathy, and liver metastases 2. Pain secondary to #1, improved.  3. Psoriasis   4. Acute nausea and vomiting with cycle 1 FOLFIRINOX-emend and prophylactic Decadron were added with cycle 2, improved.  5. Urinary hesitancy-this is most likely related to narcotic analgesics, chemotherapy, and prostatic hypertrophy. 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 Improved with Flomax.  6. Anorexia/weight loss secondary to #1  7. Admission with hypotension and severe anemia secondary to upper gastrointestinal bleeding on 11/28/2013   Upper endoscopy 11/28/2013 confirmed a friable mass in the duodenum consistent with pancreas cancer invading the duodenum   He has developed recurrent bleeding over the past 12 hours. He has severe anemia. I discussed the case with interventional radiology. They will see him to attempt an embolization procedure.  I discussed the CT findings with Mr. Schollmeyer and his family. He is not a surgical candidate. We will plan for salvage chemotherapy if he recovers from the acute GI bleeding. We can also consider palliative radiation to the pancreas mass, but this would not impact on the acute bleeding.   Recommendations: 1. Continue narcotic analgesics for pain 2. Interventional radiology evaluation for embolization of the pancreas mass 3. Consider repeat endoscopy with endoscopic treatment for persistent bleeding 4. Transfusion support with packed  red blood cells 5. Please call oncology as needed over  the weekend. I will see him 12/03/2014.    LOS: 2 days   Tuwana Kapaun  11/30/2014, 12:08 PM

## 2014-11-30 NOTE — Procedures (Signed)
Procedure:  Visceral arteriography of celiac axis, common hepatic artery, GDA, pancreaticoduodenal trunk, SMA and SMA branch.   Transcatheter embolization of pancreaticoduodenal trunk, GDA and SMA branch. Access:  Right CFA, 5 Fr sheath Findings:  Tumor blush from GDA and pancreaticoduodenal trunk off common hepatic artery.  Both embolized with coils. SMA injection shows overt arterial bleeding with psuedoaneurysm off of proximal SMA branch due to tumor erosion with filling of duodenal and tumor lumen. Embolized with coils.  Completion arteriography shows no further extravasation. Plan:  Will follow for signs of further bleeding.  Concerned that there would be risk of further bleeding from tumor erosion.  Discussed with Dr. Benay Spice

## 2014-11-30 NOTE — Sedation Documentation (Signed)
66fr sheath removed from right fem artery by K. Mazepa, RTRCVM using manual pressure. Hemostasis achieved with a V pad. Groin level 0. 2+ RDP

## 2014-11-30 NOTE — Consult Note (Signed)
Reason for consult: UGI bleeding, need for visceral arteriogram with possible embolization  Referring Physician(s): Dr. Benay Spice  History of Present Illness: Richard Cantu is a 59 y.o. male with history of locally advanced pancreatic carcinoma. He was admitted on 11/28/2013 with abdominal pain, anemia and hypotension secondary to gastrointestinal bleeding. He underwent endoscopy on 11/28/2013 which revealed a friable bleeding mass in the lumen of the second portion of the duodenum. Findings were consistent with erosion of the large pancreatic carcinoma into the duodenum. Subsequent CT scan of the abdomen and pelvis on 11/29/2014 revealed progression of disease with marked enlargement of the centrally necrotic pancreatic mass with duodenal invasion and extensive peripancreatic and retroperitoneal lymphadenopathy along with new liver metastases. There was also mild biliary tract obstruction noted. The patient has been transfused but continues to have rectal bleeding. Today's hemoglobin is 5.4. Request is now received for mesenteric/visceral arteriogram with possible embolization.  Past Medical History  Diagnosis Date  . Hemorrhoids   . Chronic pancreatitis   . Pancreatic cyst dx'd 08/03/2014  . History of gout   . Anxiety   . 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 HTN (hypertension)   . Wears glasses   . GERD (gastroesophageal reflux disease)     Past Surgical History  Procedure Laterality Date  . Colonoscopy    . Eus N/A 08/03/2014    Procedure: ESOPHAGEAL ENDOSCOPIC ULTRASOUND (EUS) RADIAL;  Surgeon: Arta Silence, MD;  Location: [facility];  Service: Endoscopy;  Laterality: N/A;  H&P in file  . Eus N/A 09/04/2014    Procedure: ESOPHAGEAL ENDOSCOPIC ULTRASOUND (EUS) RADIAL;  Surgeon: Arta Silence, MD;  Location: [facility];  Service: Endoscopy;  Laterality: N/A;  . Fine needle aspiration N/A 09/04/2014    Procedure: FINE NEEDLE ASPIRATION (FNA) RADIAL;  Surgeon:  Arta Silence, MD;  Location: [facility];  Service: Endoscopy;  Laterality: N/A;  . Portacath placement Left 09/25/2014    Procedure: INSERTION PORT-A-CATH;  Surgeon: Stark Klein, MD;  Location: [facility];  Service: General;  Laterality: Left;  . Esophagogastroduodenoscopy N/A 11/28/2014    Procedure: ESOPHAGOGASTRODUODENOSCOPY (EGD);  Surgeon: Wonda Horner, MD;  Location: [facility];  Service: Endoscopy;  Laterality: N/A;    Allergies: Review of patient's allergies indicates no known allergies.  Medications: Prior to Admission medications   Medication Sig Start Date End Date Taking? Authorizing Provider  ALPRAZolam Duanne Moron) 1 MG tablet Take 1 tablet (1 mg total) by mouth at bedtime as needed for anxiety. 10/15/14  Yes Ladell Pier, MD  ibuprofen (ADVIL,MOTRIN) 200 MG tablet Take 800 mg by mouth every 8 (eight) hours as needed for moderate pain.   Yes Historical Provider, MD  lactose free nutrition (BOOST) LIQD Take 237 mLs by mouth 4 (four) times daily.   Yes Historical Provider, MD  lidocaine-prilocaine (EMLA) cream Apply 1 application topically as needed. Apply to [facility] site 1-2 hours prior to stick and cover with plastic wrap 09/21/14  Yes Ladell Pier, MD  morphine (MS CONTIN) 60 MG 12 hr tablet Take 1 tablet (60 mg total) by mouth every 12 (twelve) hours. 11/05/14  Yes Owens Shark, NP  morphine (MSIR) 15 MG tablet Take 1-2 tablets (15-30 mg total) by mouth every 4 (four) hours as needed for severe pain. 11/13/14  Yes Owens Shark, NP  prochlorperazine (COMPAZINE) 10 MG tablet Take 1 tablet (10 mg total) by mouth every 6 (six) hours as needed for nausea. 09/21/14  Yes Izola Price  Benay Spice, MD  tamsulosin (FLOMAX) 0.4 MG CAPS capsule Take 1 capsule (0.4 mg total) by mouth daily. 11/26/14  Yes Ladell Pier, MD  dexamethasone (DECADRON) 4 MG tablet Take 2 tablets (8 mg total) by mouth 2 (two) times daily with a meal. Take day after chemo only Patient not taking: Reported  on 11/27/2014 10/10/14   Ladell Pier, MD  ondansetron (ZOFRAN) 8 MG tablet Take 1 tablet (8 mg total) by mouth every 8 (eight) hours as needed for nausea or vomiting. Patient not taking: Reported on 10/31/2014 09/08/14   Kinnie Feil, MD  phenazopyridine (PYRIDIUM) 200 MG tablet Take 1 tablet (200 mg total) by mouth 3 (three) times daily with meals. Patient not taking: Reported on 11/13/2014 10/22/14   Ladell Pier, MD  polyethylene glycol [facility] Floria Raveling) packet Take 17 g by mouth daily. Patient not taking: Reported on 11/13/2014 09/08/14   Kinnie Feil, MD  senna-docusate (SENOKOT-S) 8.6-50 MG per tablet Take 1 tablet by mouth 2 (two) times daily. Patient not taking: Reported on 11/27/2014 09/08/14   Kinnie Feil, MD    History reviewed. No pertinent family history.  History   Social History  . Marital Status: Divorced    Spouse Name: N/A    Number of Children: N/A  . Years of Education: N/A   Social History Main Topics  . Smoking status: Former Smoker -- 0.50 packs/day for 30 years    Types: Cigarettes    Quit date: 09/21/2011  . Smokeless tobacco: Never Used     Comment: "quit smoking  <2014"  . Alcohol Use: Yes     Comment: 08/31/2014 "stopped drinking 07/24/2014"  . Drug Use: No  . Sexual Activity: None     Comment: 08/02/14--quit a couple years ago   Other Topics Concern  . 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 Negative for fever and chills.  Respiratory: Positive for shortness of breath. Negative for cough.   Cardiovascular: Negative for chest pain.  Gastrointestinal: Positive for nausea, abdominal pain and blood in stool. Negative for vomiting.  Genitourinary: Negative for dysuria and hematuria.    Musculoskeletal: Negative for back pain.  Skin: Positive for pallor.  Neurological: Negative for headaches.  Psychiatric/Behavioral: The patient is nervous/anxious.     Vital Signs: BP 104/60 mmHg  Pulse 103  Temp(Src) 98.2 F (36.8 C) (Oral)  Resp 11  Ht 5\' 9"  (1.753 m)  Wt 163 lb (73.936 kg)  BMI 24.06 kg/m2  SpO2 100%  Physical Exam  Constitutional: He is oriented to person, place, and time.  Pale WM in NAD  Cardiovascular: Intact distal pulses.   Tachy but regular  Pulmonary/Chest: Effort normal and breath sounds normal.  Abdominal: Soft. Bowel sounds are normal.  Mild tenderness to palpation , primarily epigastric region  Musculoskeletal: Normal range of motion. He exhibits no edema.  Neurological: He is alert and oriented to person, place, and time.  Skin: There is pallor.    Imaging: Ct Abdomen Pelvis W Wo Contrast  11/29/2014   CLINICAL DATA:  59 year old male with history of pancreatic cancer diagnosed in October 2015. Abdominal pain. History of chronic pancreatitis. Assess for potential  resectability of the mass.  EXAM: CT ABDOMEN AND PELVIS WITHOUT AND WITH CONTRAST  TECHNIQUE: Multidetector CT imaging of the abdomen and pelvis was performed following the standard protocol before and following the bolus administration of intravenous contrast.  CONTRAST:  122mL OMNIPAQUE IOHEXOL 300 MG/ML  SOLN  COMPARISON:  CT of the abdomen and pelvis 08/30/2014.  FINDINGS: Lower chest: Areas of mild scarring or subsegmental atelectasis are noted in the lung bases bilaterally. Small amount of right-sided pleural thickening.  Hepatobiliary: Compared to the prior examination there are now numerous hypovascular hepatic lesions, compatible with widespread metastatic disease to the liver. The largest of these measure up to 3.5 x 1.8 cm in segment 7. Mild periportal edema. Mild intra and extrahepatic biliary ductal dilatation, with common bile duct measuring up to 9 mm in the porta hepatis.  Gallbladder appears moderately distended, but is otherwise unremarkable in appearance.  Pancreas: The previously described mass in the head and uncinate process of the pancreas has markedly enlarged and is now centrally necrotic. There is an air-fluid level within the centrally necrotic portion of the mass, which suggests probable erosion of the lesion into the adjacent third portion of the duodenum. This mass is irregular in shape, and therefore difficult to measure, but is estimated to measure approximately 5.7 x 9.5 x 6.0 cm on today's examination. This mass now completely encircles the proximal superior mesenteric artery which is markedly narrowed. The mass comes in close contact with the posterior aspect of the superior mesenteric vein over approximately 1/3 of the circumference of the vessel, and is intimately associated with the splenoportal confluence, immediately beneath which the superior mesenteric vein is very narrowed and nearly completely occluded, best appreciated on coronal image 51. The splenic vein remains patent at this time, as does the portal vein. Posterior extension of the mass is now causing some compression of the left renal vein which appears markedly narrowed. There is also obscuration of the fat plane between the mass and the adjacent inferior vena cava, best appreciated on axial image 73 of series 3, and coronal image 79.  Spleen: Unremarkable.  Adrenals/Urinary Tract: Bilateral adrenal glands and bilateral kidneys are normal in appearance. No hydroureteronephrosis. Urinary bladder is normal in appearance.  Stomach/Bowel: Normal appearance of the stomach. 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  Vascular/Lymphatic: Numerous prominent peripancreatic lymph nodes are noted,  borderline enlarged and mildly enlarged (up to 12 mm in short axis), presumably metastatic. Other enlarged retroperitoneal lymph nodes measuring up to 21 x 26 mm in the left para-aortic station (image 40 of series 5). Vascular findings relevant to be pancreatic lesion, as above. Atherosclerosis throughout the abdominal and pelvic vasculature, without evidence of aneurysm or dissection.  Reproductive: Prostate gland and seminal vesicles are unremarkable in appearance.  Other: Small volume of ascites.  No pneumoperitoneum.  Musculoskeletal: There are no aggressive appearing lytic or blastic lesions noted in the visualized portions of the skeleton.  IMPRESSION: 1. Today's study demonstrates progression of disease with marked enlargement of what is now a 5.7 x 9.5 x 6.0 cm centrally necrotic pancreatic mass which has likely invaded into the third portion of the duodenum, with extensive peripancreatic and retroperitoneal lymphadenopathy and numerous new hepatic metastasis, as above. There is vascular involvement, as detailed above. Additionally, this mass appears to be  causing mild biliary tract obstruction. 2. Additional incidental findings, as above. These results were called by telephone at the time of interpretation on 11/29/2014 at 2:35 pm to Dr. Betsy Coder , who verbally acknowledged these results.   Electronically Signed   By: Vinnie Langton M.D.   On: 11/29/2014 14:41   Dg Chest Port 1 View  11/28/2014   CLINICAL DATA:  Worsening generalized weakness and shortness of breath. Initial encounter.  EXAM: PORTABLE CHEST - 1 VIEW  COMPARISON:  Chest radiograph performed 09/25/2014  FINDINGS: The lungs are well-aerated and clear. There is no evidence of focal opacification, pleural effusion or pneumothorax. Bilateral nipple shadows are seen.  The cardiomediastinal silhouette is within normal limits. A left-sided chest port is seen ending about the mid SVC. No acute osseous abnormalities are seen.  IMPRESSION: No  acute cardiopulmonary process seen.   Electronically Signed   By: Garald Balding M.D.   On: 11/28/2014 00:53    Labs:  CBC:  Recent Labs  11/29/14 0430 11/29/14 1430 11/29/14 1845 11/30/14 0549  WBC 6.2 5.9 8.5 7.1  HGB 8.8* 7.9* 6.7* 5.4*  HCT 26.0* 23.5* 19.4* 15.9*  PLT 238 243 279 222    COAGS:  Recent Labs  09/01/14 1100 11/28/14 0043  INR 1.14 1.37  APTT  --  35    BMP:  Recent Labs  11/28/14 0043 11/28/14 0825 11/29/14 0430 11/30/14 0400  NA 136 138 138 137  K 3.6 3.7 3.4* 3.6  CL 103 109 107 108  CO2 23 24 24 22   GLUCOSE 177* 111* 100* 137*  BUN 19 16 5* 12  CALCIUM 7.8* 7.2* 7.7* 7.0*  CREATININE 0.56 0.45* 0.41* 0.49*  GFRNONAA >90 >90 >90 >90  GFRAA >90 >90 >90 >90    LIVER FUNCTION TESTS:  Recent Labs  11/28/14 0043 11/28/14 0825 11/29/14 0430 11/30/14 0400  BILITOT 0.5 0.5 0.6 0.7  AST 18 15 17 20   ALT 17 15 16 15   ALKPHOS 48 44 51 39  PROT 4.9* 4.4* 5.0* 3.8*  ALBUMIN 2.5* 2.2* 2.5* 1.9*    TUMOR MARKERS:  Recent Labs  08/31/14 0439 10/24/14 0831 11/13/14 1038 11/26/14 1324  CEA 1.8  --   --   --   CA199 169.1* 55.5* 33.4 30.5    Assessment and Plan: Richard Cantu is a 59 y.o. male with history of locally advanced pancreatic carcinoma. He was admitted on 11/28/2013 with abdominal pain, anemia and hypotension secondary to gastrointestinal bleeding. He underwent endoscopy on 11/28/2013 which revealed a friable bleeding mass in the lumen of the second portion of the duodenum. Findings were consistent with erosion of the large pancreatic carcinoma into the duodenum. Subsequent CT scan of the abdomen and pelvis on 11/29/2014 revealed progression of disease with marked enlargement of the centrally necrotic pancreatic mass with duodenal invasion and extensive peripancreatic and retroperitoneal lymphadenopathy along with new liver metastases. There was also mild biliary tract obstruction noted. The patient has been transfused but  continues to have rectal bleeding. Today's hemoglobin is 5.4. Request is now received for mesenteric/visceral arteriogram with possible embolization. Imaging studies have been reviewed by Dr. Kathlene Cote. Details/risks of procedure d/w pt/friend with their understanding and consent. Procedure planned for this am ASAP.    I spent a total of 20 minutes face to face in clinical consultation, greater than 50% of which was counseling/coordinating care for visceral arteriogram with possible embolization.   Signed: Autumn Messing 11/30/2014, 9:04 AM

## 2014-11-30 NOTE — Progress Notes (Signed)
[facility] PROGRESS NOTE  Richard Cantu BZJ:[id number] DOB: 1956-10-11 DOA: 11/28/2014 PCP: Ned Card, NP Interim summary: 59 year old male with h/o pancreatic cancer, comes in for GI bleed and hypotension. He received 3 units of prbc transfusion and underwent EGD on 11/28/14. He was found to have a friable mass, probably from invasion of the pancreatic cancer into the duodenum.  Assessment/Plan: 1. GI bleed: Secondary to bleeding from the friable mass in the duodenum, invasion of the pancreatic cancer into the duodenum.   Further drop in bp and hemoglobin. Another 3 units of prbc transfusion ordered. He received a total of 6 unitsof prbc this admission. Transfuse to keep hemoglobin greater than 7.  IR consulted for embolization of the of SMA and GDA branch.    2. Hypotension: improved with prbc and fluid boluses  3. Pancreatic cancer: Dr Learta Codding on board. CT abdomen and pelvis showed progression of the cancer with necrotic pancreatic mass likely invaded into the third portion of the duodenum.    4. Anemia of blood loss: Received a total of 6  units of prbc transfusion. Monitor H&h every 6 hours.   5. Hypokalemia: replete as needed.   Code Status: full code Family Communication: discussed with family at bedside Disposition Plan: pending.    Consultants:  Gastroenterology  Oncology  IR  Procedures:  EGD  Embolization procedure by IR  CT ABD and pelvis.   Antibiotics:  NONE  HPI/Subjective: Painbetter controlled with dilaudid   Objective: Filed Vitals:   11/30/14 1315  BP: 111/90  Pulse: 117  Temp: 98.2 F (36.8 C)  Resp: 18    Intake/Output Summary (Last 24 hours) at 11/30/14 1520 Last data filed at 11/30/14 1315  Gross per 24 hour  Intake   2198 ml  Output   1050 ml  Net   1148 ml   Filed Weights   11/28/14 0021  Weight: 73.936 kg (163 lb)    Exam:   General:  Alert slightly drowsy  Cardiovascular: s1s2  Respiratory:  ctab  Abdomen: soft mildly tender diffuse  Musculoskeletal: no pedal edema.   Data Reviewed: Basic Metabolic Panel:  Recent Labs Lab 11/26/14 1324 11/28/14 0043 11/28/14 0825 11/29/14 0430 11/30/14 0400  NA 139 136 138 138 137  K 4.5 3.6 3.7 3.4* 3.6  CL  --  103 109 107 108  CO2 26 23 24 24 22   GLUCOSE 122 177* 111* 100* 137*  BUN 13.4 19 16  5* 12  CREATININE 0.8 0.56 0.45* 0.41* 0.49*  CALCIUM 9.6 7.8* 7.2* 7.7* 7.0*   Liver Function Tests:  Recent Labs Lab 11/26/14 1324 11/28/14 0043 11/28/14 0825 11/29/14 0430 11/30/14 0400  AST 17 18 15 17 20   ALT 23 17 15 16 15   ALKPHOS 81 48 44 51 39  BILITOT 0.59 0.5 0.5 0.6 0.7  PROT 6.4 4.9* 4.4* 5.0* 3.8*  ALBUMIN 3.1* 2.5* 2.2* 2.5* 1.9*    Recent Labs Lab 11/28/14 0043  LIPASE 660*   No results for input(s): AMMONIA in the last 168 hours. CBC:  Recent Labs Lab 11/28/14 0825 11/28/14 1510 11/29/14 0430 11/29/14 1430 11/29/14 1845 11/30/14 0549  WBC 5.3 5.9 6.2 5.9 8.5 7.1  NEUTROABS 3.4 4.0 4.5 4.0  --  5.3  HGB 7.2* 8.4* 8.8* 7.9* 6.7* 5.4*  HCT 21.3* 25.0* 26.0* 23.5* 19.4* 15.9*  MCV 86.9 87.1 87.0 87.7 86.6 87.8  PLT 200 222 238 243 279 222   Cardiac Enzymes:  Recent Labs Lab 11/28/14 0043  TROPONINI <0.03   BNP (last 3 results) No results for input(s): PROBNP in the last 8760 hours. CBG: No results for input(s): GLUCAP in the last 168 hours.  Recent Results (from the past 240 hour(s))  MRSA PCR Screening     Status: None   Collection Time: 11/28/14  6:42 AM  Result Value Ref Range Status   MRSA by PCR NEGATIVE NEGATIVE Final    Comment:        The GeneXpert MRSA Assay (FDA approved for NASAL specimens only), is one component of a comprehensive MRSA colonization surveillance program. It is not intended to diagnose MRSA infection nor to guide or monitor treatment for MRSA infections.      Studies: Ct Abdomen Pelvis W Wo Contrast  11/29/2014   CLINICAL DATA:  59 year old  male with history of pancreatic cancer diagnosed in October 2015. Abdominal pain. History of chronic pancreatitis. Assess for potential resectability of the mass.  EXAM: CT ABDOMEN AND PELVIS WITHOUT AND WITH CONTRAST  TECHNIQUE: Multidetector CT imaging of the abdomen and pelvis was performed following the standard protocol before and following the bolus administration of intravenous contrast.  CONTRAST:  170mL OMNIPAQUE IOHEXOL 300 MG/ML  SOLN  COMPARISON:  CT of the abdomen and pelvis 08/30/2014.  FINDINGS: Lower chest: Areas of mild scarring or subsegmental atelectasis are noted in the lung bases bilaterally. Small amount of right-sided pleural thickening.  Hepatobiliary: Compared to the prior examination there are now numerous hypovascular hepatic lesions, compatible with widespread metastatic disease to the liver. The largest of these measure up to 3.5 x 1.8 cm in segment 7. Mild periportal edema. Mild intra and extrahepatic biliary ductal dilatation, with common bile duct measuring up to 9 mm in the porta hepatis. Gallbladder appears moderately distended, but is otherwise unremarkable in appearance.  Pancreas: The previously described mass in the head and uncinate process of the pancreas has markedly enlarged and is now centrally necrotic. There is an air-fluid level within the centrally necrotic portion of the mass, which suggests probable erosion of the lesion into the adjacent third portion of the duodenum. This mass is irregular in shape, and therefore difficult to measure, but is estimated to measure approximately 5.7 x 9.5 x 6.0 cm on today's examination. This mass now completely encircles the proximal superior mesenteric artery which is markedly narrowed. The mass comes in close contact with the posterior aspect of the superior mesenteric vein over approximately 1/3 of the circumference of the vessel, and is intimately associated with the splenoportal confluence, immediately beneath which the superior  mesenteric vein is very narrowed and nearly completely occluded, best appreciated on coronal image 51. The splenic vein remains patent at this time, as does the portal vein. Posterior extension of the mass is now causing some compression of the left renal vein which appears markedly narrowed. There is also obscuration of the fat plane between the mass and the adjacent inferior vena cava, best appreciated on axial image 73 of series 3, and coronal image 79.  Spleen: Unremarkable.  Adrenals/Urinary Tract: Bilateral adrenal glands and bilateral kidneys are normal in appearance. No hydroureteronephrosis. Urinary bladder is normal in appearance.  Stomach/Bowel: Normal appearance of the stomach. 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  Vascular/Lymphatic: Numerous prominent peripancreatic lymph nodes are noted, borderline enlarged and mildly enlarged (  up to 12 mm in short axis), presumably metastatic. Other enlarged retroperitoneal lymph nodes measuring up to 21 x 26 mm in the left para-aortic station (image 40 of series 5). Vascular findings relevant to be pancreatic lesion, as above. Atherosclerosis throughout the abdominal and pelvic vasculature, without evidence of aneurysm or dissection.  Reproductive: Prostate gland and seminal vesicles are unremarkable in appearance.  Other: Small volume of ascites.  No pneumoperitoneum.  Musculoskeletal: There are no aggressive appearing lytic or blastic lesions noted in the visualized portions of the skeleton.  IMPRESSION: 1. Today's study demonstrates progression of disease with marked enlargement of what is now a 5.7 x 9.5 x 6.0 cm centrally necrotic pancreatic mass which has likely invaded into the third portion of the duodenum, with extensive peripancreatic  and retroperitoneal lymphadenopathy and numerous new hepatic metastasis, as above. There is vascular involvement, as detailed above. Additionally, this mass appears to be causing mild biliary tract obstruction. 2. Additional incidental findings, as above. These results were called by telephone at the time of interpretation on 11/29/2014 at 2:35 pm to Dr. Betsy Coder , who verbally acknowledged these results.   Electronically Signed   By: Vinnie Langton M.D.   On: 11/29/2014 14:41    Scheduled Meds: . sodium chloride   Intravenous Once  . ALPRAZolam  0.5 mg Oral Once  . antiseptic oral rinse  7 mL Mouth Rinse q12n4p  . chlorhexidine  15 mL Mouth Rinse BID  . HYDROmorphone      . lidocaine      . morphine  60 mg Oral Q12H  . sodium chloride  3 mL Intravenous Q12H   Continuous Infusions: . sodium chloride 125 mL/hr at 11/30/14 0411  . sodium chloride      Active Problems:   GIB (gastrointestinal bleeding)   Protein-calorie malnutrition, severe   Severe malnutrition    Time spent: 25 min    [place]  [facility] Pager [phone]. If 7PM-7AM, please contact night-coverage at [email or web address], password [facility] 11/30/2014, 3:20 PM  LOS: 2 days

## 2014-11-30 NOTE — Progress Notes (Signed)
[facility] Progress Note  Subjective: The patient continues to experience gastrointestinal bleeding. His hemoglobin has dropped to 5.4. The CT scan shows enlargement of his pancreatic tumor with erosion into the duodenum. This would correspond to the abnormality seen on EGD.  Objective: Vital signs in last 24 hours: Temp:  [98.1 F (36.7 C)-98.5 F (36.9 C)] 98.2 F (36.8 C) (01/08 0745) Pulse Rate:  [86-142] 103 (01/08 0800) Resp:  [10-18] 11 (01/08 0800) BP: (72-125)/(49-73) 104/60 mmHg (01/08 0800) SpO2:  [97 %-100 %] 100 % (01/08 0800) Weight change:    PE  Alert and oriented  Abdomen is soft nontender  Lab Results: Results for orders placed or performed during the hospital encounter of 11/28/14 (from the past 24 hour(s))  CBC WITH DIFFERENTIAL     Status: Abnormal   Collection Time: 11/29/14  2:30 PM  Result Value Ref Range   WBC 5.9 4.0 - 10.5 K/uL   RBC 2.68 (L) 4.22 - 5.81 MIL/uL   Hemoglobin 7.9 (L) 13.0 - 17.0 g/dL   HCT 23.5 (L) 39.0 - 52.0 %   MCV 87.7 78.0 - 100.0 fL   MCH 29.5 26.0 - 34.0 pg   MCHC 33.6 30.0 - 36.0 g/dL   RDW 14.5 11.5 - 15.5 %   Platelets 243 150 - 400 K/uL   Neutrophils Relative % 68 43 - 77 %   Neutro Abs 4.0 1.7 - 7.7 K/uL   Lymphocytes Relative 15 12 - 46 %   Lymphs Abs 0.9 0.7 - 4.0 K/uL   Monocytes Relative 16 (H) 3 - 12 %   Monocytes Absolute 0.9 0.1 - 1.0 K/uL   Eosinophils Relative 1 0 - 5 %   Eosinophils Absolute 0.1 0.0 - 0.7 K/uL   Basophils Relative 0 0 - 1 %   Basophils Absolute 0.0 0.0 - 0.1 K/uL  CBC     Status: Abnormal   Collection Time: 11/29/14  6:45 PM  Result Value Ref Range   WBC 8.5 4.0 - 10.5 K/uL   RBC 2.24 (L) 4.22 - 5.81 MIL/uL   Hemoglobin 6.7 (LL) 13.0 - 17.0 g/dL   HCT 19.4 (L) 39.0 - 52.0 %   MCV 86.6 78.0 - 100.0 fL   MCH 29.9 26.0 - 34.0 pg   MCHC 34.5 30.0 - 36.0 g/dL   RDW 14.3 11.5 - 15.5 %   Platelets 279 150 - 400 K/uL  Prepare RBC     Status: None   Collection Time:  11/29/14  8:30 PM  Result Value Ref Range   Order Confirmation ORDER PROCESSED BY BLOOD BANK   Comprehensive metabolic panel     Status: Abnormal   Collection Time: 11/30/14  4:00 AM  Result Value Ref Range   Sodium 137 135 - 145 mmol/L   Potassium 3.6 3.5 - 5.1 mmol/L   Chloride 108 96 - 112 mEq/L   CO2 22 19 - 32 mmol/L   Glucose, Bld 137 (H) 70 - 99 mg/dL   BUN 12 6 - 23 mg/dL   Creatinine, Ser 0.49 (L) 0.50 - 1.35 mg/dL   Calcium 7.0 (L) 8.4 - 10.5 mg/dL   Total Protein 3.8 (L) 6.0 - 8.3 g/dL   Albumin 1.9 (L) 3.5 - 5.2 g/dL   AST 20 0 - 37 U/L   ALT 15 0 - 53 U/L   Alkaline Phosphatase 39 39 - 117 U/L   Total Bilirubin 0.7 0.3 - 1.2 mg/dL   GFR calc non Af Amer >90 >  90 mL/min   GFR calc Af Amer >90 >90 mL/min   Anion gap 7 5 - 15  CBC with Differential     Status: Abnormal   Collection Time: 11/30/14  5:49 AM  Result Value Ref Range   WBC 7.1 4.0 - 10.5 K/uL   RBC 1.81 (L) 4.22 - 5.81 MIL/uL   Hemoglobin 5.4 (LL) 13.0 - 17.0 g/dL   HCT 15.9 (L) 39.0 - 52.0 %   MCV 87.8 78.0 - 100.0 fL   MCH 29.8 26.0 - 34.0 pg   MCHC 34.0 30.0 - 36.0 g/dL   RDW 14.9 11.5 - 15.5 %   Platelets 222 150 - 400 K/uL   Neutrophils Relative % 75 43 - 77 %   Neutro Abs 5.3 1.7 - 7.7 K/uL   Lymphocytes Relative 14 12 - 46 %   Lymphs Abs 1.0 0.7 - 4.0 K/uL   Monocytes Relative 11 3 - 12 %   Monocytes Absolute 0.8 0.1 - 1.0 K/uL   Eosinophils Relative 0 0 - 5 %   Eosinophils Absolute 0.0 0.0 - 0.7 K/uL   Basophils Relative 0 0 - 1 %   Basophils Absolute 0.0 0.0 - 0.1 K/uL  Prepare RBC     Status: None   Collection Time: 11/30/14  7:00 AM  Result Value Ref Range   Order Confirmation ORDER PROCESSED BY BLOOD BANK     Studies/Results: Ct Abdomen Pelvis W Wo Contrast  11/29/2014   CLINICAL DATA:  59 year old male with history of pancreatic cancer diagnosed in October 2015. Abdominal pain. History of chronic pancreatitis. Assess for potential resectability of the mass.  EXAM: CT ABDOMEN AND  PELVIS WITHOUT AND WITH CONTRAST  TECHNIQUE: Multidetector CT imaging of the abdomen and pelvis was performed following the standard protocol before and following the bolus administration of intravenous contrast.  CONTRAST:  174mL OMNIPAQUE IOHEXOL 300 MG/ML  SOLN  COMPARISON:  CT of the abdomen and pelvis 08/30/2014.  FINDINGS: Lower chest: Areas of mild scarring or subsegmental atelectasis are noted in the lung bases bilaterally. Small amount of right-sided pleural thickening.  Hepatobiliary: Compared to the prior examination there are now numerous hypovascular hepatic lesions, compatible with widespread metastatic disease to the liver. The largest of these measure up to 3.5 x 1.8 cm in segment 7. Mild periportal edema. Mild intra and extrahepatic biliary ductal dilatation, with common bile duct measuring up to 9 mm in the porta hepatis. Gallbladder appears moderately distended, but is otherwise unremarkable in appearance.  Pancreas: The previously described mass in the head and uncinate process of the pancreas has markedly enlarged and is now centrally necrotic. There is an air-fluid level within the centrally necrotic portion of the mass, which suggests probable erosion of the lesion into the adjacent third portion of the duodenum. This mass is irregular in shape, and therefore difficult to measure, but is estimated to measure approximately 5.7 x 9.5 x 6.0 cm on today's examination. This mass now completely encircles the proximal superior mesenteric artery which is markedly narrowed. The mass comes in close contact with the posterior aspect of the superior mesenteric vein over approximately 1/3 of the circumference of the vessel, and is intimately associated with the splenoportal confluence, immediately beneath which the superior mesenteric vein is very narrowed and nearly completely occluded, best appreciated on coronal image 51. The splenic vein remains patent at this time, as does the portal vein. Posterior  extension of the mass is now causing some compression of the left  renal vein which appears markedly narrowed. There is also obscuration of the fat plane between the mass and the adjacent inferior vena cava, best appreciated on axial image 73 of series 3, and coronal image 79.  Spleen: Unremarkable.  Adrenals/Urinary Tract: Bilateral adrenal glands and bilateral kidneys are normal in appearance. No hydroureteronephrosis. Urinary bladder is normal in appearance.  Stomach/Bowel: Normal appearance of the stomach. 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  Vascular/Lymphatic: Numerous prominent peripancreatic lymph nodes are noted, borderline enlarged and mildly enlarged (up to 12 mm in short axis), presumably metastatic. Other enlarged retroperitoneal lymph nodes measuring up to 21 x 26 mm in the left para-aortic station (image 40 of series 5). Vascular findings relevant to be pancreatic lesion, as above. Atherosclerosis throughout the abdominal and pelvic vasculature, without evidence of aneurysm or dissection.  Reproductive: Prostate gland and seminal vesicles are unremarkable in appearance.  Other: Small volume of ascites.  No pneumoperitoneum.  Musculoskeletal: There are no aggressive appearing lytic or blastic lesions noted in the visualized portions of the skeleton.  IMPRESSION: 1. Today's study demonstrates progression of disease with marked enlargement of what is now a 5.7 x 9.5 x 6.0 cm centrally necrotic pancreatic mass which has likely invaded into the third portion of the duodenum, with extensive peripancreatic and retroperitoneal lymphadenopathy and numerous new hepatic metastasis, as above. There is vascular involvement, as detailed above. Additionally, this mass appears to be causing mild  biliary tract obstruction. 2. Additional incidental findings, as above. These results were called by telephone at the time of interpretation on 11/29/2014 at 2:35 pm to Dr. Betsy Coder , who verbally acknowledged these results.   Electronically Signed   By: Vinnie Langton M.D.   On: 11/29/2014 14:41      Assessment: Gastrointestinal bleeding secondary to erosion of a large pancreatic carcinoma into the duodenum. This was seen on EGD as well as CT scan. There is nothing that we can do endoscopically for this.  Plan: Supportive care. Transfuse as needed.    Avanti Jetter F 11/30/2014, 8:49 AM

## 2014-11-30 NOTE — Progress Notes (Signed)
Attempted to get labs but unable to draw back blood. Charge RN also attempted. IV team consulted.

## 2014-11-30 NOTE — Progress Notes (Signed)
Patient became nauseated, tachycardic, and sweaty around 0300- PRN Zofran 4mg  IV was given at 0310. 0400 BP was 72/55 retaken at 0404 74/53, HR 120-130, patient light headed, sweaty, and nauseated. Patient began having dry heaves. Patient also experiencing abdominal pain 8/10. Paged NP on call, awaiting further orders. Will continue to monitor. Luther Parody , RN.

## 2014-11-30 NOTE — Progress Notes (Signed)
Radiation Oncology         [phone] ________________________________  Name: Richard Cantu MRN: [id number]  Date: 11/28/2014  DOB: 1956/05/12  Diagnosis:   Pancreatic cancer  Narrative:   The patient unfortunately has evidence on imaging of progression of his pancreatic tumor. Invasion into local blood vessels and the third portion of the duodenum appears present. The patient has been experiencing significant bleeding and underwent embolization earlier today through interventional radiology. I have discussed the patient's case with Dr. Benay Spice in medical oncology who inquired about possible palliative radiation treatment for improved local control/decreased bleeding.  The patient today states that he is doing reasonably well. Quite tired after embolization today.                              ALLERGIES:  has No Known Allergies.  Meds: Current Facility-Administered Medications  Medication Dose Route Frequency Provider Last Rate Last Dose  . 0.9 %  sodium chloride infusion   Intravenous Continuous Shanda Howells, MD 125 mL/hr at 11/30/14 0411    . 0.9 %  sodium chloride infusion   Intravenous Continuous Missy Sabins, MD      . 0.9 %  sodium chloride infusion   Intravenous Once Jeryl Columbia, NP      . ALPRAZolam Duanne Moron) tablet 0.5 mg  0.5 mg Oral Once Gardiner Barefoot, NP   0.5 mg at 11/29/14 0225  . ALPRAZolam Duanne Moron) tablet 1 mg  1 mg Oral QHS PRN Chesley Mires, MD   1 mg at 11/30/14 0320  . alteplase (CATHFLO ACTIVASE) injection 2 mg  2 mg Intracatheter Once Hosie Poisson, MD      . antiseptic oral rinse (CPC / CETYLPYRIDINIUM CHLORIDE 0.05%) solution 7 mL  7 mL Mouth Rinse q12n4p Hosie Poisson, MD   7 mL at 11/29/14 1600  . chlorhexidine (PERIDEX) 0.12 % solution 15 mL  15 mL Mouth Rinse BID Hosie Poisson, MD   15 mL at 11/29/14 1219  . HYDROmorphone (DILAUDID) 2 MG/ML injection           . HYDROmorphone (DILAUDID) injection 2 mg  2 mg Intravenous Q2H PRN Hosie Poisson, MD   2 mg  at 11/30/14 0159  . lidocaine (XYLOCAINE) 1 % (with pres) injection           . morphine (MS CONTIN) 12 hr tablet 60 mg  60 mg Oral Q12H Owens Shark, NP   Stopped at 11/30/14 1000  . morphine (MSIR) tablet 15-30 mg  15-30 mg Oral Q4H PRN Owens Shark, NP   30 mg at 11/29/14 1944  . ondansetron (ZOFRAN) injection 4 mg  4 mg Intravenous Q4H PRN Hosie Poisson, MD   4 mg at 11/30/14 1358  . sodium chloride 0.9 % injection 3 mL  3 mL Intravenous Q12H Shanda Howells, MD   3 mL at 11/29/14 2200    Physical Findings: The patient is in no acute distress. Patient is alert and oriented.  height is 5\' 9"  (1.753 m) and weight is 163 lb (73.936 kg). His oral temperature is 98.2 F (36.8 C). His blood pressure is 111/90 and his pulse is 117. His respiration is 18 and oxygen saturation is 100%. .     Lab Findings: Lab Results  Component Value Date   WBC 7.1 11/30/2014   HGB 5.4* 11/30/2014   HCT 15.9* 11/30/2014   MCV 87.8 11/30/2014   PLT 222 11/30/2014  Radiographic Findings: Ct Abdomen Pelvis W Wo Contrast  11/29/2014   CLINICAL DATA:  59 year old male with history of pancreatic cancer diagnosed in October 2015. Abdominal pain. History of chronic pancreatitis. Assess for potential resectability of the mass.  EXAM: CT ABDOMEN AND PELVIS WITHOUT AND WITH CONTRAST  TECHNIQUE: Multidetector CT imaging of the abdomen and pelvis was performed following the standard protocol before and following the bolus administration of intravenous contrast.  CONTRAST:  154mL OMNIPAQUE IOHEXOL 300 MG/ML  SOLN  COMPARISON:  CT of the abdomen and pelvis 08/30/2014.  FINDINGS: Lower chest: Areas of mild scarring or subsegmental atelectasis are noted in the lung bases bilaterally. Small amount of right-sided pleural thickening.  Hepatobiliary: Compared to the prior examination there are now numerous hypovascular hepatic lesions, compatible with widespread metastatic disease to the liver. The largest of these measure up to  3.5 x 1.8 cm in segment 7. Mild periportal edema. Mild intra and extrahepatic biliary ductal dilatation, with common bile duct measuring up to 9 mm in the porta hepatis. Gallbladder appears moderately distended, but is otherwise unremarkable in appearance.  Pancreas: The previously described mass in the head and uncinate process of the pancreas has markedly enlarged and is now centrally necrotic. There is an air-fluid level within the centrally necrotic portion of the mass, which suggests probable erosion of the lesion into the adjacent third portion of the duodenum. This mass is irregular in shape, and therefore difficult to measure, but is estimated to measure approximately 5.7 x 9.5 x 6.0 cm on today's examination. This mass now completely encircles the proximal superior mesenteric artery which is markedly narrowed. The mass comes in close contact with the posterior aspect of the superior mesenteric vein over approximately 1/3 of the circumference of the vessel, and is intimately associated with the splenoportal confluence, immediately beneath which the superior mesenteric vein is very narrowed and nearly completely occluded, best appreciated on coronal image 51. The splenic vein remains patent at this time, as does the portal vein. Posterior extension of the mass is now causing some compression of the left renal vein which appears markedly narrowed. There is also obscuration of the fat plane between the mass and the adjacent inferior vena cava, best appreciated on axial image 73 of series 3, and coronal image 79.  Spleen: Unremarkable.  Adrenals/Urinary Tract: Bilateral adrenal glands and bilateral kidneys are normal in appearance. No hydroureteronephrosis. Urinary bladder is normal in appearance.  Stomach/Bowel: Normal appearance of the stomach. 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  Vascular/Lymphatic: Numerous prominent peripancreatic lymph nodes are noted, borderline enlarged and mildly enlarged (up to 12 mm in short axis), presumably metastatic. Other enlarged retroperitoneal lymph nodes measuring up to 21 x 26 mm in the left para-aortic station (image 40 of series 5). Vascular findings relevant to be pancreatic lesion, as above. Atherosclerosis throughout the abdominal and pelvic vasculature, without evidence of aneurysm or dissection.  Reproductive: Prostate gland and seminal vesicles are unremarkable in appearance.  Other: Small volume of ascites.  No pneumoperitoneum.  Musculoskeletal: There are no aggressive appearing lytic or blastic lesions noted in the visualized portions of the skeleton.  IMPRESSION: 1. Today's study demonstrates progression of disease with marked enlargement of what is now a 5.7 x 9.5 x 6.0 cm centrally necrotic pancreatic mass which has  likely invaded into the third portion of the duodenum, with extensive peripancreatic and retroperitoneal lymphadenopathy and numerous new hepatic metastasis, as above. There is vascular involvement, as detailed above. Additionally, this mass appears to be causing mild biliary tract obstruction. 2. Additional incidental findings, as above. These results were called by telephone at the time of interpretation on 11/29/2014 at 2:35 pm to Dr. Betsy Coder , who verbally acknowledged these results.   Electronically Signed   By: Vinnie Langton M.D.   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 Endoscopy on 11/28/2014 demonstrated friable tumor extending into the lumen of the duodenum. CT has demonstrated invasion and encasement of the SMA trunk by the  large duodenal tumor.  EXAM: 1. ULTRASOUND GUIDANCE FOR VASCULAR ACCESS OF THE RIGHT COMMON FEMORAL ARTERY 2. SELECTIVE ARTERIOGRAPHY OF THE CELIAC AXIS 3. SELECTIVE ARTERIOGRAPHY OF THE COMMON HEPATIC ARTERY 4. SELECTIVE ARTERIOGRAPHY OF THE GASTRODUODENAL ARTERY 5. SELECTIVE ARTERIOGRAPHY OF A PANCREATICODUODENAL TRUNK OFF OF THE GASTRODUODENAL ARTERY 6. TRANSCATHETER COIL EMBOLIZATION OF PANCREATICODUODENAL TRUNK 7. TRANSCATHETER COIL EMBOLIZATION OF GASTRODUODENAL ARTERY 8. SELECTIVE ARTERIOGRAPHY OF THE SUPERIOR MESENTERIC ARTERY 9. ADDITIONAL SECOND ORDER ARTERIOGRAPHY TIMES TWO OF SUPERIOR MESENTERIC ARTERY BRANCHES 10. TRANSCATHETER COIL EMBOLIZATION OF SUPERIOR MESENTERIC ARTERY  ANESTHESIA/SEDATION: Sedation:  2.5 mg IV Versed sec, 150 mcg IV fentanyl  Total Moderate Sedation Time:  84 minutes.  MEDICATIONS: 1.5 mg IV Dilaudid  CONTRAST:  128mL OMNIPAQUE IOHEXOL 300 MG/ML  SOLN  FLUOROSCOPY TIME:  24 min and 24 seconds.  PROCEDURE: Prior to the procedure, informed consent was obtained from the patient for visceral arteriography with possible transcatheter embolization to treat active upper GI bleeding. Maximal barrier sterile technique was utilized including caps, mask, sterile gowns, sterile gloves, sterile drape, hand hygiene and skin antiseptic.  The right groin was prepped with Betadine. Local anesthesia was provided with 1% lidocaine. A time-out was performed prior to the procedure.  Ultrasound was used to confirm patency of the right common femoral artery. Under direct ultrasound guidance, the artery was accessed with a micropuncture set. A 5 French sheath was placed over a guidewire. A 5 [place] cobra catheter was advanced into the abdominal aorta. This is used to selectively catheterize the celiac axis. Selective arteriography was through the 5 French catheter.  The 5 French catheter was further advanced into the common hepatic artery. Selective arteriography was performed. A micro catheter was then  advanced through the 5 French catheter into a pancreaticoduodenal artery emanating off of the proximal gastroduodenal artery. Selective arteriography was performed. Transcatheter embolization of the pancreaticoduodenal branch was then performed utilizing 2 mm and 3 mm diameter Interlock coils. Arteriography was performed through the micro catheter following embolization.  The micro catheter was then redirected into the main trunk of the gastroduodenal artery. Selective arteriography was performed. The gastroduodenal artery was then embolized utilizing interlock coils bearing in diameter from 3 mm up to 5 mm. Arteriography was performed through the micro catheter following embolization.  The micro catheter was removed. The superior mesenteric artery was catheterized with the 5 French catheter. Selective arteriography was performed. A micro catheter was advanced into a second order SMA branch. Selective arteriography was performed. A micro catheter was then further advanced in the superior mesenteric artery and additional selective arteriography performed. Transcatheter coil embolization was then performed of the superior mesenteric artery with  coils ranging in diameter from 5 mm down to 3 mm. Additional arteriography was then performed through the 5 French catheter positioned in the proximal superior mesenteric artery.  Oblique arteriography was performed at the level of right groin access.  COMPLICATIONS: None immediate.  FINDINGS: Celiac arteriography and common hepatic arteriography shows a patent gastroduodenal artery. There is a prominent lateral pancreaticoduodenal branch off of the proximal GDA. Selective arteriography at the level of the pancreaticoduodenal branch demonstrated a focal pseudoaneurysm at the level of the pancreatic head or proximal duodenum. Tumor blush was also identified. This branch was successfully occluded with coils.  The gastroduodenal artery trunk was also embolized successfully with  coils resulting in complete angiographic occlusion.  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 Once a micro catheter was advanced into the region of extravasation, contrast injection shows opacification of the duodenum lumen. The bleeding segment of the SMA trunk, including the pseudoaneurysm was able to be successfully occluded by embolization coils. Branch vessel patency was preserved and there is collateral reconstitution of distal branches beyond the SMA trunk. The inferior mesenteric artery is presumably also supplying collateral supply. The IMA was not injected.  IMPRESSION: 1. 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 2. Tumor erosion of the SMA trunk with pseudoaneurysm formation and overt contrast extravasation into the duodenal lumen. The SMA trunk was embolized with coils, preserve ring proximal branch patency. There is collateral reconstitution of multiple distal branches with SMA injection. The IMA is also presumably supplying additional collateral vessels to bowel.   Electronically Signed   By: Aletta Edouard M.D.   On: 11/30/2014 16:13   Ir Angiogram Visceral Selective  11/30/2014   CLINICAL DATA:  Progression of metastatic pancreatic carcinoma with invasion of the duodenum and active bleeding requiring multiple units of blood transfusion overnight. Endoscopy on 11/28/2014 demonstrated friable tumor extending into the lumen of the duodenum. CT has demonstrated invasion and encasement of the SMA trunk by the large duodenal tumor.  EXAM: 1. ULTRASOUND GUIDANCE FOR VASCULAR ACCESS OF THE RIGHT COMMON FEMORAL ARTERY 2. SELECTIVE ARTERIOGRAPHY OF THE  CELIAC AXIS 3. SELECTIVE ARTERIOGRAPHY OF THE COMMON HEPATIC ARTERY 4. SELECTIVE ARTERIOGRAPHY OF THE GASTRODUODENAL ARTERY 5. SELECTIVE ARTERIOGRAPHY OF A PANCREATICODUODENAL TRUNK OFF OF THE GASTRODUODENAL ARTERY 6. TRANSCATHETER COIL EMBOLIZATION OF PANCREATICODUODENAL TRUNK 7. TRANSCATHETER COIL EMBOLIZATION OF GASTRODUODENAL ARTERY 8. SELECTIVE ARTERIOGRAPHY OF THE SUPERIOR MESENTERIC ARTERY 9. ADDITIONAL SECOND ORDER ARTERIOGRAPHY TIMES TWO OF SUPERIOR MESENTERIC ARTERY BRANCHES 10. TRANSCATHETER COIL EMBOLIZATION OF SUPERIOR MESENTERIC ARTERY  ANESTHESIA/SEDATION: Sedation:  2.5 mg IV Versed sec, 150 mcg IV fentanyl  Total Moderate Sedation Time:  84 minutes.  MEDICATIONS: 1.5 mg IV Dilaudid  CONTRAST:  162mL OMNIPAQUE IOHEXOL 300 MG/ML  SOLN  FLUOROSCOPY TIME:  24 min and 24 seconds.  PROCEDURE: Prior to the procedure, informed consent was obtained from the patient for visceral arteriography with possible transcatheter embolization to treat active upper GI bleeding. Maximal barrier sterile technique was utilized including caps, mask, sterile gowns, sterile gloves, sterile drape, hand hygiene and skin antiseptic.  The right groin was prepped with Betadine. Local anesthesia was provided with 1% lidocaine. A time-out was performed prior to the procedure.  Ultrasound was used to confirm  patency of the right common femoral artery. Under direct ultrasound guidance, the artery was accessed with a micropuncture set. A 5 French sheath was placed over a guidewire. A 5 [place] cobra catheter was advanced into the abdominal aorta. This is used to selectively catheterize the celiac axis. Selective arteriography was through the 5 French catheter.  The 5 French catheter was further advanced into the common hepatic artery. Selective arteriography was performed. A micro catheter was then advanced through the 5 French catheter into a pancreaticoduodenal artery emanating off of the proximal gastroduodenal artery. Selective  arteriography was performed. Transcatheter embolization of the pancreaticoduodenal branch was then performed utilizing 2 mm and 3 mm diameter Interlock coils. Arteriography was performed through the micro catheter following embolization.  The micro catheter was then redirected into the main trunk of the gastroduodenal artery. Selective arteriography was performed. The gastroduodenal artery was then embolized utilizing interlock coils bearing in diameter from 3 mm up to 5 mm. Arteriography was performed through the micro catheter following embolization.  The micro catheter was removed. The superior mesenteric artery was catheterized with the 5 French catheter. Selective arteriography was performed. A micro catheter was advanced into a second order SMA branch. Selective arteriography was performed. A micro catheter was then further advanced in the superior mesenteric artery and additional selective arteriography performed. Transcatheter coil embolization was then performed of the superior mesenteric artery with coils ranging in diameter from 5 mm down to 3 mm. Additional arteriography was then performed through the 5 French catheter positioned in the proximal superior mesenteric artery.  Oblique arteriography was performed at the level of right groin access.  COMPLICATIONS: None immediate.  FINDINGS: Celiac arteriography and common hepatic arteriography shows a patent gastroduodenal artery. There is a prominent lateral pancreaticoduodenal branch off of the proximal GDA. Selective arteriography at the level of the pancreaticoduodenal branch demonstrated a focal pseudoaneurysm at the level of the pancreatic head or proximal duodenum. Tumor blush was also identified. This branch was successfully occluded with coils.  The gastroduodenal artery trunk was also embolized successfully with coils resulting in complete angiographic occlusion.  SMA arteriography shows erosion of the SMA trunk by tumor with formation of a  pseudoaneurysm of the SMA trunk and contrast extravasation just inferior to the level of proximal branches supplying the jejunum, ileum and right colon. Once a micro catheter was advanced into the region of extravasation, contrast injection shows opacification of the duodenum lumen. The bleeding segment of the SMA trunk, including the pseudoaneurysm was able to be successfully occluded by embolization coils. Branch vessel patency was preserved and there is collateral reconstitution of distal branches beyond the SMA trunk. The inferior mesenteric artery is presumably also supplying collateral supply. The IMA was not injected.  IMPRESSION: 1. 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 2. Tumor erosion of the SMA trunk with pseudoaneurysm formation and overt contrast extravasation into the duodenal lumen. The SMA trunk was embolized with coils, preserve ring proximal branch patency. There is collateral reconstitution of multiple distal branches with SMA injection. The IMA is also presumably supplying additional collateral vessels to bowel.   Electronically Signed   By: Aletta Edouard M.D.   On: 11/30/2014 16:13   Ir Angiogram Selective Each Additional Vessel  11/30/2014   CLINICAL DATA:  Progression of metastatic pancreatic carcinoma with invasion of the duodenum and active bleeding requiring multiple units of blood transfusion  overnight. Endoscopy on 11/28/2014 demonstrated friable tumor extending into the lumen of the duodenum. CT has demonstrated invasion and encasement of the SMA trunk by the large duodenal tumor.  EXAM: 1. ULTRASOUND GUIDANCE FOR VASCULAR ACCESS OF THE RIGHT COMMON FEMORAL ARTERY 2. SELECTIVE ARTERIOGRAPHY OF THE CELIAC AXIS 3. SELECTIVE ARTERIOGRAPHY OF THE COMMON HEPATIC ARTERY 4. SELECTIVE ARTERIOGRAPHY OF THE GASTRODUODENAL  ARTERY 5. SELECTIVE ARTERIOGRAPHY OF A PANCREATICODUODENAL TRUNK OFF OF THE GASTRODUODENAL ARTERY 6. TRANSCATHETER COIL EMBOLIZATION OF PANCREATICODUODENAL TRUNK 7. TRANSCATHETER COIL EMBOLIZATION OF GASTRODUODENAL ARTERY 8. SELECTIVE ARTERIOGRAPHY OF THE SUPERIOR MESENTERIC ARTERY 9. ADDITIONAL SECOND ORDER ARTERIOGRAPHY TIMES TWO OF SUPERIOR MESENTERIC ARTERY BRANCHES 10. TRANSCATHETER COIL EMBOLIZATION OF SUPERIOR MESENTERIC ARTERY  ANESTHESIA/SEDATION: Sedation:  2.5 mg IV Versed sec, 150 mcg IV fentanyl  Total Moderate Sedation Time:  84 minutes.  MEDICATIONS: 1.5 mg IV Dilaudid  CONTRAST:  115mL OMNIPAQUE IOHEXOL 300 MG/ML  SOLN  FLUOROSCOPY TIME:  24 min and 24 seconds.  PROCEDURE: Prior to the procedure, informed consent was obtained from the patient for visceral arteriography with possible transcatheter embolization to treat active upper GI bleeding. Maximal barrier sterile technique was utilized including caps, mask, sterile gowns, sterile gloves, sterile drape, hand hygiene and skin antiseptic.  The right groin was prepped with Betadine. Local anesthesia was provided with 1% lidocaine. A time-out was performed prior to the procedure.  Ultrasound was used to confirm patency of the right common femoral artery. Under direct ultrasound guidance, the artery was accessed with a micropuncture set. A 5 French sheath was placed over a guidewire. A 5 [place] cobra catheter was advanced into the abdominal aorta. This is used to selectively catheterize the celiac axis. Selective arteriography was through the 5 French catheter.  The 5 French catheter was further advanced into the common hepatic artery. Selective arteriography was performed. A micro catheter was then advanced through the 5 French catheter into a pancreaticoduodenal artery emanating off of the proximal gastroduodenal artery. Selective arteriography was performed. Transcatheter embolization of the pancreaticoduodenal branch was then performed utilizing 2  mm and 3 mm diameter Interlock coils. Arteriography was performed through the micro catheter following embolization.  The micro catheter was then redirected into the main trunk of the gastroduodenal artery. Selective arteriography was performed. The gastroduodenal artery was then embolized utilizing interlock coils bearing in diameter from 3 mm up to 5 mm. Arteriography was performed through the micro catheter following embolization.  The micro catheter was removed. The superior mesenteric artery was catheterized with the 5 French catheter. Selective arteriography was performed. A micro catheter was advanced into a second order SMA branch. Selective arteriography was performed. A micro catheter was then further advanced in the superior mesenteric artery and additional selective arteriography performed. Transcatheter coil embolization was then performed of the superior mesenteric artery with coils ranging in diameter from 5 mm down to 3 mm. Additional arteriography was then performed through the 5 French catheter positioned in the proximal superior mesenteric artery.  Oblique arteriography was performed at the level of right groin access.  COMPLICATIONS: None immediate.  FINDINGS: Celiac arteriography and common hepatic arteriography shows a patent gastroduodenal artery. There is a prominent lateral pancreaticoduodenal branch off of the proximal GDA. Selective arteriography at the level of the pancreaticoduodenal branch demonstrated a focal pseudoaneurysm at the level of the pancreatic head or proximal duodenum. Tumor blush was also identified. This branch was successfully occluded with coils.  The gastroduodenal artery trunk was also embolized successfully with coils resulting in complete angiographic occlusion.  SMA arteriography shows erosion of the SMA trunk by tumor with formation of a pseudoaneurysm of the SMA trunk and contrast extravasation just inferior to the level of proximal branches supplying the  jejunum, ileum and right colon. Once a micro catheter was advanced into the region of extravasation, contrast injection shows opacification of the duodenum lumen. The bleeding segment of the SMA trunk, including the pseudoaneurysm was able to be successfully occluded by embolization coils. Branch vessel patency was preserved and there is collateral reconstitution of distal branches beyond the SMA trunk. The inferior mesenteric artery is presumably also supplying collateral supply. The IMA was not injected.  IMPRESSION: 1. 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 2. Tumor erosion of the SMA trunk with pseudoaneurysm formation and overt contrast extravasation into the duodenal lumen. The SMA trunk was embolized with coils, preserve ring proximal branch patency. There is collateral reconstitution of multiple distal branches with SMA injection. The IMA is also presumably supplying additional collateral vessels to bowel.   Electronically Signed   By: Aletta Edouard M.D.   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 Endoscopy on 11/28/2014 demonstrated friable tumor extending into the lumen of the duodenum. CT has demonstrated invasion and encasement of the SMA trunk by the large duodenal tumor.  EXAM: 1. ULTRASOUND GUIDANCE FOR VASCULAR ACCESS OF THE RIGHT COMMON FEMORAL ARTERY 2. SELECTIVE ARTERIOGRAPHY OF THE CELIAC AXIS 3. SELECTIVE ARTERIOGRAPHY OF THE COMMON HEPATIC ARTERY 4. SELECTIVE ARTERIOGRAPHY OF THE GASTRODUODENAL ARTERY 5. SELECTIVE ARTERIOGRAPHY OF A PANCREATICODUODENAL TRUNK OFF OF THE GASTRODUODENAL ARTERY 6. TRANSCATHETER COIL  EMBOLIZATION OF PANCREATICODUODENAL TRUNK 7. TRANSCATHETER COIL EMBOLIZATION OF GASTRODUODENAL ARTERY 8. SELECTIVE ARTERIOGRAPHY OF THE SUPERIOR MESENTERIC ARTERY 9. ADDITIONAL SECOND ORDER ARTERIOGRAPHY TIMES TWO OF SUPERIOR MESENTERIC ARTERY BRANCHES 10. TRANSCATHETER COIL EMBOLIZATION OF SUPERIOR MESENTERIC ARTERY  ANESTHESIA/SEDATION: Sedation:  2.5 mg IV Versed sec, 150 mcg IV fentanyl  Total Moderate Sedation Time:  84 minutes.  MEDICATIONS: 1.5 mg IV Dilaudid  CONTRAST:  129mL OMNIPAQUE IOHEXOL 300 MG/ML  SOLN  FLUOROSCOPY TIME:  24 min and 24 seconds.  PROCEDURE: Prior to the procedure, informed consent was obtained from the patient for visceral arteriography with possible transcatheter embolization to treat active upper GI bleeding. Maximal barrier sterile technique was utilized including caps, mask, sterile gowns, sterile gloves, sterile drape, hand hygiene and skin antiseptic.  The right groin was prepped with Betadine. Local anesthesia was provided with 1% lidocaine. A time-out was performed prior to the procedure.  Ultrasound was used to confirm patency of the right common femoral artery. Under direct ultrasound guidance, the artery was accessed with a micropuncture set. A 5 French sheath was placed over a guidewire. A 5 [place] cobra catheter was advanced into the abdominal aorta. This is used to selectively catheterize the celiac axis. Selective arteriography was through the 5 French catheter.  The 5 French catheter was further advanced into the common hepatic artery. Selective arteriography was performed. A micro catheter was then advanced through the 5 French catheter into a pancreaticoduodenal artery emanating off of the proximal gastroduodenal artery. Selective arteriography was performed. Transcatheter embolization of the pancreaticoduodenal branch was then performed utilizing 2 mm and 3 mm diameter Interlock coils. Arteriography was  performed through the micro catheter following embolization.   The micro catheter was then redirected into the main trunk of the gastroduodenal artery. Selective arteriography was performed. The gastroduodenal artery was then embolized utilizing interlock coils bearing in diameter from 3 mm up to 5 mm. Arteriography was performed through the micro catheter following embolization.  The micro catheter was removed. The superior mesenteric artery was catheterized with the 5 French catheter. Selective arteriography was performed. A micro catheter was advanced into a second order SMA branch. Selective arteriography was performed. A micro catheter was then further advanced in the superior mesenteric artery and additional selective arteriography performed. Transcatheter coil embolization was then performed of the superior mesenteric artery with coils ranging in diameter from 5 mm down to 3 mm. Additional arteriography was then performed through the 5 French catheter positioned in the proximal superior mesenteric artery.  Oblique arteriography was performed at the level of right groin access.  COMPLICATIONS: None immediate.  FINDINGS: Celiac arteriography and common hepatic arteriography shows a patent gastroduodenal artery. There is a prominent lateral pancreaticoduodenal branch off of the proximal GDA. Selective arteriography at the level of the pancreaticoduodenal branch demonstrated a focal pseudoaneurysm at the level of the pancreatic head or proximal duodenum. Tumor blush was also identified. This branch was successfully occluded with coils.  The gastroduodenal artery trunk was also embolized successfully with coils resulting in complete angiographic occlusion.  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 Once a micro catheter was advanced into the region of extravasation, contrast injection shows  opacification of the duodenum lumen. The bleeding segment of the SMA trunk, including the pseudoaneurysm was able to be successfully occluded by embolization coils. Branch vessel patency was preserved and there is collateral reconstitution of distal branches beyond the SMA trunk. The inferior mesenteric artery is presumably also supplying collateral supply. The IMA was not injected.  IMPRESSION: 1. 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 2. Tumor erosion of the SMA trunk with pseudoaneurysm formation and overt contrast extravasation into the duodenal lumen. The SMA trunk was embolized with coils, preserve ring proximal branch patency. There is collateral reconstitution of multiple distal branches with SMA injection. The IMA is also presumably supplying additional collateral vessels to bowel.   Electronically Signed   By: Aletta Edouard M.D.   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 Endoscopy on 11/28/2014 demonstrated friable tumor extending into the lumen of the duodenum. CT has demonstrated invasion and encasement of the SMA trunk by the large duodenal tumor.  EXAM: 1. ULTRASOUND GUIDANCE FOR VASCULAR ACCESS OF THE RIGHT COMMON FEMORAL ARTERY 2. SELECTIVE ARTERIOGRAPHY OF THE CELIAC AXIS 3. SELECTIVE ARTERIOGRAPHY OF THE COMMON HEPATIC ARTERY 4. SELECTIVE ARTERIOGRAPHY OF THE GASTRODUODENAL ARTERY 5. SELECTIVE ARTERIOGRAPHY OF A PANCREATICODUODENAL TRUNK OFF OF THE GASTRODUODENAL ARTERY 6. TRANSCATHETER COIL EMBOLIZATION OF PANCREATICODUODENAL TRUNK 7. TRANSCATHETER COIL EMBOLIZATION OF GASTRODUODENAL ARTERY 8. SELECTIVE ARTERIOGRAPHY  OF THE SUPERIOR MESENTERIC ARTERY 9. ADDITIONAL SECOND ORDER ARTERIOGRAPHY TIMES TWO OF SUPERIOR MESENTERIC ARTERY BRANCHES 10. TRANSCATHETER COIL EMBOLIZATION OF SUPERIOR MESENTERIC  ARTERY  ANESTHESIA/SEDATION: Sedation:  2.5 mg IV Versed sec, 150 mcg IV fentanyl  Total Moderate Sedation Time:  84 minutes.  MEDICATIONS: 1.5 mg IV Dilaudid  CONTRAST:  150mL OMNIPAQUE IOHEXOL 300 MG/ML  SOLN  FLUOROSCOPY TIME:  24 min and 24 seconds.  PROCEDURE: Prior to the procedure, informed consent was obtained from the patient for visceral arteriography with possible transcatheter embolization to treat active upper GI bleeding. Maximal barrier sterile technique was utilized including caps, mask, sterile gowns, sterile gloves, sterile drape, hand hygiene and skin antiseptic.  The right groin was prepped with Betadine. Local anesthesia was provided with 1% lidocaine. A time-out was performed prior to the procedure.  Ultrasound was used to confirm patency of the right common femoral artery. Under direct ultrasound guidance, the artery was accessed with a micropuncture set. A 5 French sheath was placed over a guidewire. A 5 [place] cobra catheter was advanced into the abdominal aorta. This is used to selectively catheterize the celiac axis. Selective arteriography was through the 5 French catheter.  The 5 French catheter was further advanced into the common hepatic artery. Selective arteriography was performed. A micro catheter was then advanced through the 5 French catheter into a pancreaticoduodenal artery emanating off of the proximal gastroduodenal artery. Selective arteriography was performed. Transcatheter embolization of the pancreaticoduodenal branch was then performed utilizing 2 mm and 3 mm diameter Interlock coils. Arteriography was performed through the micro catheter following embolization.  The micro catheter was then redirected into the main trunk of the gastroduodenal artery. Selective arteriography was performed. The  gastroduodenal artery was then embolized utilizing interlock coils bearing in diameter from 3 mm up to 5 mm. Arteriography was performed through the micro catheter following embolization.  The micro catheter was removed. The superior mesenteric artery was catheterized with the 5 French catheter. Selective arteriography was performed. A micro catheter was advanced into a second order SMA branch. Selective arteriography was performed. A micro catheter was then further advanced in the superior mesenteric artery and additional selective arteriography performed. Transcatheter coil embolization was then performed of the superior mesenteric artery with coils ranging in diameter from 5 mm down to 3 mm. Additional arteriography was then performed through the 5 French catheter positioned in the proximal superior mesenteric artery.  Oblique arteriography was performed at the level of right groin access.  COMPLICATIONS: None immediate.  FINDINGS: Celiac arteriography and common hepatic arteriography shows a patent gastroduodenal artery. There is a prominent lateral pancreaticoduodenal branch off of the proximal GDA. Selective arteriography at the level of the pancreaticoduodenal branch demonstrated a focal pseudoaneurysm at the level of the pancreatic head or proximal duodenum. Tumor blush was also identified. This branch was successfully occluded with coils.  The gastroduodenal artery trunk was also embolized successfully with coils resulting in complete angiographic occlusion.  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 Once a micro catheter was advanced into the region of extravasation, contrast injection shows opacification of the duodenum lumen. The bleeding segment of the SMA trunk, including the pseudoaneurysm was able to be  successfully occluded by embolization coils. Branch vessel patency was preserved and there is collateral reconstitution of distal branches beyond the SMA trunk. The inferior mesenteric artery is presumably also supplying collateral supply. The IMA was not injected.  IMPRESSION: 1. 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 2. Tumor erosion of the SMA trunk with pseudoaneurysm formation and overt contrast extravasation into the duodenal lumen. The SMA trunk was embolized with coils, preserve ring proximal branch patency. There is collateral reconstitution of multiple distal branches with SMA injection. The IMA is also presumably supplying additional collateral vessels to bowel.   Electronically Signed   By: Aletta Edouard M.D.   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 Endoscopy on 11/28/2014 demonstrated friable tumor extending into the lumen of the duodenum. CT has demonstrated invasion and encasement of the SMA trunk by the large duodenal tumor.  EXAM: 1. ULTRASOUND GUIDANCE FOR VASCULAR ACCESS OF THE RIGHT COMMON FEMORAL ARTERY 2. SELECTIVE ARTERIOGRAPHY OF THE CELIAC AXIS 3. SELECTIVE ARTERIOGRAPHY OF THE COMMON HEPATIC ARTERY 4. SELECTIVE ARTERIOGRAPHY OF THE GASTRODUODENAL ARTERY 5. SELECTIVE ARTERIOGRAPHY OF A PANCREATICODUODENAL TRUNK OFF OF THE GASTRODUODENAL ARTERY 6. TRANSCATHETER COIL EMBOLIZATION OF PANCREATICODUODENAL TRUNK 7. TRANSCATHETER COIL EMBOLIZATION OF GASTRODUODENAL ARTERY 8. SELECTIVE ARTERIOGRAPHY OF THE SUPERIOR MESENTERIC ARTERY 9. ADDITIONAL SECOND ORDER ARTERIOGRAPHY TIMES TWO OF SUPERIOR MESENTERIC ARTERY  BRANCHES 10. TRANSCATHETER COIL EMBOLIZATION OF SUPERIOR MESENTERIC ARTERY  ANESTHESIA/SEDATION: Sedation:  2.5 mg IV Versed sec, 150 mcg IV fentanyl  Total Moderate Sedation Time:  84 minutes.  MEDICATIONS: 1.5 mg IV Dilaudid  CONTRAST:  142mL OMNIPAQUE IOHEXOL 300 MG/ML  SOLN  FLUOROSCOPY TIME:  24 min and 24 seconds.  PROCEDURE: Prior to the procedure, informed consent was obtained from the patient for visceral arteriography with possible transcatheter embolization to treat active upper GI bleeding. Maximal barrier sterile technique was utilized including caps, mask, sterile gowns, sterile gloves, sterile drape, hand hygiene and skin antiseptic.  The right groin was prepped with Betadine. Local anesthesia was provided with 1% lidocaine. A time-out was performed prior to the procedure.  Ultrasound was used to confirm patency of the right common femoral artery. Under direct ultrasound guidance, the artery was accessed with a micropuncture set. A 5 French sheath was placed over a guidewire. A 5 [place] cobra catheter was advanced into the abdominal aorta. This is used to selectively catheterize the celiac axis. Selective arteriography was through the 5 French catheter.  The 5 French catheter was further advanced into the common hepatic artery. Selective arteriography was performed. A micro catheter was then advanced through the 5 French catheter into a pancreaticoduodenal artery emanating off of the proximal gastroduodenal artery. Selective arteriography was performed. Transcatheter embolization of the pancreaticoduodenal branch was then performed utilizing 2 mm and 3 mm diameter Interlock coils. Arteriography was performed through the micro catheter following embolization.  The micro catheter was then redirected into the main trunk of the gastroduodenal artery. Selective arteriography was performed. The gastroduodenal artery was then embolized utilizing interlock coils bearing in diameter from 3 mm up to 5 mm.  Arteriography was performed through the micro catheter following embolization.  The micro catheter was removed. The superior mesenteric artery was catheterized with the 5 French catheter. Selective arteriography was performed. A micro catheter was advanced into a second order SMA branch. Selective arteriography was performed. A micro catheter was then further advanced in the superior mesenteric artery and additional selective arteriography performed. Transcatheter coil embolization was then performed of the superior mesenteric  artery with coils ranging in diameter from 5 mm down to 3 mm. Additional arteriography was then performed through the 5 French catheter positioned in the proximal superior mesenteric artery.  Oblique arteriography was performed at the level of right groin access.  COMPLICATIONS: None immediate.  FINDINGS: Celiac arteriography and common hepatic arteriography shows a patent gastroduodenal artery. There is a prominent lateral pancreaticoduodenal branch off of the proximal GDA. Selective arteriography at the level of the pancreaticoduodenal branch demonstrated a focal pseudoaneurysm at the level of the pancreatic head or proximal duodenum. Tumor blush was also identified. This branch was successfully occluded with coils.  The gastroduodenal artery trunk was also embolized successfully with coils resulting in complete angiographic occlusion.  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 Once a micro catheter was advanced into the region of extravasation, contrast injection shows opacification of the duodenum lumen. The bleeding segment of the SMA trunk, including the pseudoaneurysm was able to be successfully occluded by embolization coils. Branch vessel patency was preserved and there is collateral reconstitution  of distal branches beyond the SMA trunk. The inferior mesenteric artery is presumably also supplying collateral supply. The IMA was not injected.  IMPRESSION: 1. 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 2. Tumor erosion of the SMA trunk with pseudoaneurysm formation and overt contrast extravasation into the duodenal lumen. The SMA trunk was embolized with coils, preserve ring proximal branch patency. There is collateral reconstitution of multiple distal branches with SMA injection. The IMA is also presumably supplying additional collateral vessels to bowel.   Electronically Signed   By: Aletta Edouard M.D.   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 Endoscopy on 11/28/2014 demonstrated friable tumor extending into the lumen of the duodenum. CT has demonstrated invasion and encasement of the SMA trunk by the large duodenal tumor.  EXAM: 1. ULTRASOUND GUIDANCE FOR VASCULAR ACCESS OF THE RIGHT COMMON FEMORAL ARTERY 2. SELECTIVE ARTERIOGRAPHY OF THE CELIAC AXIS 3. SELECTIVE ARTERIOGRAPHY OF THE COMMON HEPATIC ARTERY 4. SELECTIVE ARTERIOGRAPHY OF THE GASTRODUODENAL ARTERY 5. SELECTIVE ARTERIOGRAPHY OF A PANCREATICODUODENAL TRUNK OFF OF THE GASTRODUODENAL ARTERY 6. TRANSCATHETER COIL EMBOLIZATION OF PANCREATICODUODENAL TRUNK 7. TRANSCATHETER COIL EMBOLIZATION OF GASTRODUODENAL ARTERY 8. SELECTIVE ARTERIOGRAPHY OF THE SUPERIOR MESENTERIC ARTERY 9. ADDITIONAL SECOND ORDER ARTERIOGRAPHY TIMES TWO OF SUPERIOR MESENTERIC ARTERY BRANCHES 10. TRANSCATHETER COIL EMBOLIZATION OF SUPERIOR MESENTERIC ARTERY  ANESTHESIA/SEDATION: Sedation:  2.5 mg IV Versed  sec, 150 mcg IV fentanyl  Total Moderate Sedation Time:  84 minutes.  MEDICATIONS: 1.5 mg IV Dilaudid  CONTRAST:  11mL OMNIPAQUE IOHEXOL 300 MG/ML  SOLN  FLUOROSCOPY TIME:  24 min and 24 seconds.  PROCEDURE: Prior to the procedure, informed consent was obtained from the patient for visceral arteriography with possible transcatheter embolization to treat active upper GI bleeding. Maximal barrier sterile technique was utilized including caps, mask, sterile gowns, sterile gloves, sterile drape, hand hygiene and skin antiseptic.  The right groin was prepped with Betadine. Local anesthesia was provided with 1% lidocaine. A time-out was performed prior to the procedure.  Ultrasound  was used to confirm patency of the right common femoral artery. Under direct ultrasound guidance, the artery was accessed with a micropuncture set. A 5 French sheath was placed over a guidewire. A 5 [place] cobra catheter was advanced into the abdominal aorta. This is used to selectively catheterize the celiac axis. Selective arteriography was through the 5 French catheter.  The 5 French catheter was further advanced into the common hepatic artery. Selective arteriography was performed. A micro catheter was then advanced through the 5 French catheter into a pancreaticoduodenal artery emanating off of the proximal gastroduodenal artery. Selective arteriography was performed. Transcatheter embolization of the pancreaticoduodenal branch was then performed utilizing 2 mm and 3 mm diameter Interlock coils. Arteriography was performed through the micro catheter following embolization.  The micro catheter was then redirected into the main trunk of the gastroduodenal artery. Selective arteriography was performed. The gastroduodenal artery was then embolized utilizing interlock coils bearing in diameter from 3 mm up to 5 mm. Arteriography was performed through the micro catheter following embolization.  The micro catheter was removed. The superior  mesenteric artery was catheterized with the 5 French catheter. Selective arteriography was performed. A micro catheter was advanced into a second order SMA branch. Selective arteriography was performed. A micro catheter was then further advanced in the superior mesenteric artery and additional selective arteriography performed. Transcatheter coil embolization was then performed of the superior mesenteric artery with coils ranging in diameter from 5 mm down to 3 mm. Additional arteriography was then performed through the 5 French catheter positioned in the proximal superior mesenteric artery.  Oblique arteriography was performed at the level of right groin access.  COMPLICATIONS: None immediate.  FINDINGS: Celiac arteriography and common hepatic arteriography shows a patent gastroduodenal artery. There is a prominent lateral pancreaticoduodenal branch off of the proximal GDA. Selective arteriography at the level of the pancreaticoduodenal branch demonstrated a focal pseudoaneurysm at the level of the pancreatic head or proximal duodenum. Tumor blush was also identified. This branch was successfully occluded with coils.  The gastroduodenal artery trunk was also embolized successfully with coils resulting in complete angiographic occlusion.  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 Once a micro catheter was advanced into the region of extravasation, contrast injection shows opacification of the duodenum lumen. The bleeding segment of the SMA trunk, including the pseudoaneurysm was able to be successfully occluded by embolization coils. Branch vessel patency was preserved and there is collateral reconstitution of distal branches beyond the SMA trunk. The inferior mesenteric artery is presumably also supplying collateral supply. The  IMA was not injected.  IMPRESSION: 1. 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 2. Tumor erosion of the SMA trunk with pseudoaneurysm formation and overt contrast extravasation into the duodenal lumen. The SMA trunk was embolized with coils, preserve ring proximal branch patency. There is collateral reconstitution of multiple distal branches with SMA injection. The IMA is also presumably supplying additional collateral vessels to bowel.   Electronically Signed   By: Aletta Edouard M.D.   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 Endoscopy on 11/28/2014 demonstrated friable tumor extending into the lumen of the duodenum. CT has demonstrated invasion and encasement of the SMA trunk by the large duodenal tumor.  EXAM: 1. ULTRASOUND GUIDANCE FOR VASCULAR ACCESS OF THE RIGHT COMMON FEMORAL ARTERY 2. SELECTIVE ARTERIOGRAPHY OF THE CELIAC AXIS 3. SELECTIVE ARTERIOGRAPHY OF THE COMMON HEPATIC ARTERY 4. SELECTIVE ARTERIOGRAPHY OF THE GASTRODUODENAL ARTERY 5. SELECTIVE ARTERIOGRAPHY OF A PANCREATICODUODENAL TRUNK OFF OF THE GASTRODUODENAL ARTERY 6. TRANSCATHETER COIL EMBOLIZATION OF PANCREATICODUODENAL TRUNK 7. TRANSCATHETER COIL EMBOLIZATION OF GASTRODUODENAL ARTERY 8. SELECTIVE ARTERIOGRAPHY OF THE SUPERIOR MESENTERIC ARTERY 9. ADDITIONAL SECOND ORDER ARTERIOGRAPHY TIMES TWO OF SUPERIOR MESENTERIC ARTERY BRANCHES 10. TRANSCATHETER COIL EMBOLIZATION OF SUPERIOR MESENTERIC ARTERY  ANESTHESIA/SEDATION: Sedation:  2.5 mg IV Versed sec, 150 mcg IV fentanyl  Total Moderate Sedation Time:  84 minutes.  MEDICATIONS: 1.5 mg IV Dilaudid  CONTRAST:  19mL OMNIPAQUE IOHEXOL  300 MG/ML  SOLN  FLUOROSCOPY TIME:  24 min and 24 seconds.  PROCEDURE: Prior to the procedure, informed consent was obtained from the patient for visceral arteriography with possible transcatheter embolization to treat active upper GI bleeding. Maximal barrier sterile technique was utilized including caps, mask, sterile gowns, sterile gloves, sterile drape, hand hygiene and skin antiseptic.  The right groin was prepped with Betadine. Local anesthesia was provided with 1% lidocaine. A time-out was performed prior to the procedure.  Ultrasound was used to confirm patency of the right common femoral artery. Under direct ultrasound guidance, the artery was accessed with a micropuncture set. A 5 French sheath was placed over a guidewire. A 5 [place] cobra catheter was advanced into the abdominal aorta. This is used to selectively catheterize the celiac axis. Selective arteriography was through the 5 French catheter.  The 5 French catheter was further advanced into the common hepatic artery. Selective arteriography was performed. A micro catheter was then advanced through the 5 French catheter into a pancreaticoduodenal artery emanating off of the proximal gastroduodenal artery. Selective arteriography was performed. Transcatheter embolization of the pancreaticoduodenal branch was then performed utilizing 2 mm and 3 mm diameter Interlock coils. Arteriography was performed through the micro catheter following embolization.  The micro catheter was then redirected into the main trunk of the gastroduodenal artery. Selective arteriography was performed. The gastroduodenal artery was then embolized utilizing interlock coils bearing in diameter from 3 mm up to 5 mm. Arteriography was performed through the micro catheter following embolization.  The micro catheter was removed. The superior mesenteric artery was catheterized with the 5 French catheter. Selective arteriography was performed. A micro catheter was advanced into a  second order SMA branch. Selective arteriography was performed. A micro catheter was then further advanced in the superior mesenteric artery and additional selective arteriography performed. Transcatheter coil embolization was then performed of the superior mesenteric artery with coils ranging in diameter from 5 mm down to 3 mm. Additional arteriography was then performed through the 5 French catheter positioned in the proximal superior mesenteric artery.  Oblique arteriography was performed at the level of right groin access.  COMPLICATIONS: None immediate.  FINDINGS: Celiac arteriography and common hepatic arteriography shows a patent gastroduodenal artery. There is a prominent lateral pancreaticoduodenal branch off of the proximal GDA. Selective arteriography at the level of the pancreaticoduodenal branch demonstrated a focal pseudoaneurysm at the level of the pancreatic head or proximal duodenum. Tumor blush was also identified. This branch was successfully occluded with coils.  The gastroduodenal artery trunk was also embolized successfully with coils resulting  in complete angiographic occlusion.  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 Once a micro catheter was advanced into the region of extravasation, contrast injection shows opacification of the duodenum lumen. The bleeding segment of the SMA trunk, including the pseudoaneurysm was able to be successfully occluded by embolization coils. Branch vessel patency was preserved and there is collateral reconstitution of distal branches beyond the SMA trunk. The inferior mesenteric artery is presumably also supplying collateral supply. The IMA was not injected.  IMPRESSION: 1. 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 2. Tumor erosion of the SMA trunk with pseudoaneurysm formation and overt contrast extravasation into the duodenal lumen. The SMA trunk was embolized with coils, preserve ring proximal branch patency. There is collateral reconstitution of multiple distal branches with SMA injection. The IMA is also presumably supplying additional collateral vessels to bowel.   Electronically Signed   By: Aletta Edouard M.D.   On: 11/30/2014 16:13   Dg Chest Port 1 View  11/28/2014   CLINICAL DATA:  Worsening generalized weakness and shortness of breath. Initial encounter.  EXAM: PORTABLE CHEST - 1 VIEW  COMPARISON:  Chest radiograph performed 09/25/2014  FINDINGS: The lungs are well-aerated and clear. There is no evidence of focal opacification, pleural effusion or pneumothorax. Bilateral nipple shadows are seen.  The cardiomediastinal silhouette is within normal limits. A left-sided chest port is seen ending about the mid SVC. No acute osseous abnormalities are seen.  IMPRESSION: No acute cardiopulmonary process seen.   Electronically Signed   By: Garald Balding M.D.   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 Endoscopy on 11/28/2014 demonstrated friable tumor extending into the lumen of the duodenum. CT has demonstrated invasion and encasement of the SMA trunk by the large duodenal tumor.  EXAM: 1. ULTRASOUND GUIDANCE FOR VASCULAR ACCESS OF THE RIGHT COMMON FEMORAL ARTERY 2. SELECTIVE ARTERIOGRAPHY OF THE CELIAC AXIS 3. SELECTIVE ARTERIOGRAPHY OF THE COMMON HEPATIC ARTERY 4. SELECTIVE ARTERIOGRAPHY OF THE GASTRODUODENAL ARTERY 5. SELECTIVE ARTERIOGRAPHY OF A PANCREATICODUODENAL TRUNK OFF OF THE  GASTRODUODENAL ARTERY 6. TRANSCATHETER COIL EMBOLIZATION OF PANCREATICODUODENAL TRUNK 7. TRANSCATHETER COIL EMBOLIZATION OF GASTRODUODENAL ARTERY 8. SELECTIVE ARTERIOGRAPHY OF THE SUPERIOR MESENTERIC ARTERY 9. ADDITIONAL SECOND ORDER ARTERIOGRAPHY TIMES TWO OF SUPERIOR MESENTERIC ARTERY BRANCHES 10. TRANSCATHETER COIL EMBOLIZATION OF SUPERIOR MESENTERIC ARTERY  ANESTHESIA/SEDATION: Sedation:  2.5 mg IV Versed sec, 150 mcg IV fentanyl  Total Moderate Sedation Time:  84 minutes.  MEDICATIONS: 1.5 mg IV Dilaudid  CONTRAST:  181mL OMNIPAQUE IOHEXOL 300 MG/ML  SOLN  FLUOROSCOPY TIME:  24 min and 24 seconds.  PROCEDURE: Prior to the procedure, informed consent was obtained from the patient for visceral arteriography with possible transcatheter embolization to treat active upper GI bleeding. Maximal barrier sterile technique was utilized including caps, mask, sterile gowns, sterile gloves, sterile drape, hand hygiene and skin antiseptic.  The right groin was prepped with Betadine. Local anesthesia was provided with 1% lidocaine. A time-out was performed prior to the procedure.  Ultrasound was used to  confirm patency of the right common femoral artery. Under direct ultrasound guidance, the artery was accessed with a micropuncture set. A 5 French sheath was placed over a guidewire. A 5 [place] cobra catheter was advanced into the abdominal aorta. This is used to selectively catheterize the celiac axis. Selective arteriography was through the 5 French catheter.  The 5 French catheter was further advanced into the common hepatic artery. Selective arteriography was performed. A micro catheter was then advanced through the 5 French catheter into a pancreaticoduodenal artery emanating off of the proximal gastroduodenal artery. Selective arteriography was performed. Transcatheter embolization of the pancreaticoduodenal branch was then performed utilizing 2 mm and 3 mm diameter Interlock coils. Arteriography was performed through  the micro catheter following embolization.  The micro catheter was then redirected into the main trunk of the gastroduodenal artery. Selective arteriography was performed. The gastroduodenal artery was then embolized utilizing interlock coils bearing in diameter from 3 mm up to 5 mm. Arteriography was performed through the micro catheter following embolization.  The micro catheter was removed. The superior mesenteric artery was catheterized with the 5 French catheter. Selective arteriography was performed. A micro catheter was advanced into a second order SMA branch. Selective arteriography was performed. A micro catheter was then further advanced in the superior mesenteric artery and additional selective arteriography performed. Transcatheter coil embolization was then performed of the superior mesenteric artery with coils ranging in diameter from 5 mm down to 3 mm. Additional arteriography was then performed through the 5 French catheter positioned in the proximal superior mesenteric artery.  Oblique arteriography was performed at the level of right groin access.  COMPLICATIONS: None immediate.  FINDINGS: Celiac arteriography and common hepatic arteriography shows a patent gastroduodenal artery. There is a prominent lateral pancreaticoduodenal branch off of the proximal GDA. Selective arteriography at the level of the pancreaticoduodenal branch demonstrated a focal pseudoaneurysm at the level of the pancreatic head or proximal duodenum. Tumor blush was also identified. This branch was successfully occluded with coils.  The gastroduodenal artery trunk was also embolized successfully with coils resulting in complete angiographic occlusion.  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 Once a micro catheter was advanced into the  region of extravasation, contrast injection shows opacification of the duodenum lumen. The bleeding segment of the SMA trunk, including the pseudoaneurysm was able to be successfully occluded by embolization coils. Branch vessel patency was preserved and there is collateral reconstitution of distal branches beyond the SMA trunk. The inferior mesenteric artery is presumably also supplying collateral supply. The IMA was not injected.  IMPRESSION: 1. 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 2. Tumor erosion of the SMA trunk with pseudoaneurysm formation and overt contrast extravasation into the duodenal lumen. The SMA trunk was embolized with coils, preserve ring proximal branch patency. There is collateral reconstitution of multiple distal branches with SMA injection. The IMA is also presumably supplying additional collateral vessels to bowel.   Electronically Signed   By: Aletta Edouard M.D.   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 Endoscopy on 11/28/2014 demonstrated friable tumor extending into the lumen of the duodenum. CT has demonstrated invasion and encasement of the SMA trunk by the large duodenal tumor.  EXAM: 1. ULTRASOUND GUIDANCE FOR VASCULAR ACCESS OF THE RIGHT COMMON FEMORAL ARTERY 2. SELECTIVE ARTERIOGRAPHY OF THE CELIAC AXIS 3. SELECTIVE ARTERIOGRAPHY OF THE COMMON HEPATIC ARTERY 4. SELECTIVE ARTERIOGRAPHY OF THE GASTRODUODENAL ARTERY 5. SELECTIVE ARTERIOGRAPHY OF A PANCREATICODUODENAL TRUNK OFF OF THE GASTRODUODENAL ARTERY 6. TRANSCATHETER COIL EMBOLIZATION OF PANCREATICODUODENAL TRUNK 7. TRANSCATHETER COIL  EMBOLIZATION OF GASTRODUODENAL ARTERY 8. SELECTIVE ARTERIOGRAPHY OF THE SUPERIOR MESENTERIC ARTERY 9. ADDITIONAL SECOND ORDER ARTERIOGRAPHY TIMES TWO OF SUPERIOR MESENTERIC ARTERY BRANCHES 10. TRANSCATHETER COIL EMBOLIZATION OF SUPERIOR MESENTERIC ARTERY  ANESTHESIA/SEDATION: Sedation:  2.5 mg IV Versed sec, 150 mcg IV fentanyl  Total Moderate Sedation Time:  84 minutes.  MEDICATIONS: 1.5 mg IV Dilaudid  CONTRAST:  194mL OMNIPAQUE IOHEXOL 300 MG/ML  SOLN  FLUOROSCOPY TIME:  24 min and 24 seconds.  PROCEDURE: Prior to the procedure, informed consent was obtained from the patient for visceral arteriography with possible transcatheter embolization to treat active upper GI bleeding. Maximal barrier sterile technique was utilized including caps, mask, sterile gowns, sterile gloves, sterile drape, hand hygiene and skin antiseptic.  The right groin was prepped with Betadine. Local anesthesia was provided with 1% lidocaine. A time-out was performed prior to the procedure.  Ultrasound was used to confirm patency of the right common femoral artery. Under direct ultrasound guidance, the artery was accessed with a micropuncture set. A 5 French sheath was placed over a guidewire. A 5 [place] cobra catheter was advanced into the abdominal aorta. This is used to selectively catheterize the celiac axis. Selective arteriography was through the 5 French catheter.  The 5 French catheter was further advanced into the common hepatic artery. Selective arteriography was performed. A micro catheter was then advanced through the 5 French catheter into a pancreaticoduodenal artery emanating off of the proximal gastroduodenal artery. Selective arteriography was performed. Transcatheter embolization of the pancreaticoduodenal branch was then performed utilizing 2 mm and 3 mm diameter Interlock coils. Arteriography was performed through the micro catheter following embolization.  The micro catheter was then redirected into the main trunk of the  gastroduodenal artery. Selective arteriography was performed. The gastroduodenal artery was then embolized utilizing interlock coils bearing in diameter from 3 mm up to 5 mm. Arteriography was performed through the micro catheter following embolization.  The micro catheter was removed. The superior mesenteric artery was catheterized with the 5 French catheter. Selective arteriography was performed. A micro catheter was advanced into a second order SMA branch. Selective arteriography was performed. A micro catheter was then further advanced in the superior mesenteric artery and additional selective arteriography performed. Transcatheter coil embolization was then performed of the superior mesenteric artery with coils ranging in diameter from 5 mm down to 3 mm. Additional arteriography was then performed through the 5 French catheter positioned in the proximal superior mesenteric artery.  Oblique arteriography was performed at the level of right groin access.  COMPLICATIONS: None immediate.  FINDINGS: Celiac arteriography and common hepatic arteriography shows a patent gastroduodenal artery. There is a prominent lateral pancreaticoduodenal branch off of the proximal GDA. Selective arteriography at the level of the pancreaticoduodenal branch demonstrated a focal pseudoaneurysm at the level of the pancreatic head or proximal duodenum. Tumor blush was also identified. This branch was successfully occluded with coils.  The gastroduodenal artery trunk was also embolized successfully  with coils resulting in complete angiographic occlusion.  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 Once a micro catheter was advanced into the region of extravasation, contrast injection shows opacification of the duodenum lumen. The bleeding segment of the  SMA trunk, including the pseudoaneurysm was able to be successfully occluded by embolization coils. Branch vessel patency was preserved and there is collateral reconstitution of distal branches beyond the SMA trunk. The inferior mesenteric artery is presumably also supplying collateral supply. The IMA was not injected.  IMPRESSION: 1. 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 2. Tumor erosion of the SMA trunk with pseudoaneurysm formation and overt contrast extravasation into the duodenal lumen. The SMA trunk was embolized with coils, preserve ring proximal branch patency. There is collateral reconstitution of multiple distal branches with SMA injection. The IMA is also presumably supplying additional collateral vessels to bowel.   Electronically Signed   By: Aletta Edouard M.D.   On: 11/30/2014 16:13    Impression/plan:    The patient I believe is appropriate for palliative radiation treatment to the pancreatic tumor. This will likely be accompanied by concurrent chemotherapy through Dr. Benay Spice in medical oncology. The patient and I discussed this possible treatment today. He was unable to come to our clinic this afternoon for simulation because of his procedure earlier. We therefore will schedule him for simulation on Monday with a plan to likely begin radiation treatment on Wednesday. I would anticipate 3 weeks of daily radiation treatment with concurrent chemotherapy, likely in the form of Xeloda.  Jodelle Gross, M.D., Ph.D.

## 2014-12-01 ENCOUNTER — Encounter (HOSPITAL_COMMUNITY): Payer: Self-pay | Admitting: *Deleted

## 2014-12-01 LAB — HEMOGLOBIN AND HEMATOCRIT, BLOOD
HCT: 20.2 % — ABNORMAL LOW (ref 39.0–52.0)
HCT: 26.9 % — ABNORMAL LOW (ref 39.0–52.0)
HEMATOCRIT: 17.6 % — AB (ref 39.0–52.0)
HEMATOCRIT: 28 % — AB (ref 39.0–52.0)
HEMOGLOBIN: 6 g/dL — AB (ref 13.0–17.0)
Hemoglobin: 6.9 g/dL — CL (ref 13.0–17.0)
Hemoglobin: 9.2 g/dL — ABNORMAL LOW (ref 13.0–17.0)
Hemoglobin: 9.4 g/dL — ABNORMAL LOW (ref 13.0–17.0)

## 2014-12-01 LAB — COMPREHENSIVE METABOLIC PANEL
ALBUMIN: 1.9 g/dL — AB (ref 3.5–5.2)
ALK PHOS: 37 U/L — AB (ref 39–117)
ALT: 18 U/L (ref 0–53)
AST: 21 U/L (ref 0–37)
Anion gap: 6 (ref 5–15)
BUN: 12 mg/dL (ref 6–23)
CHLORIDE: 110 meq/L (ref 96–112)
CO2: 24 mmol/L (ref 19–32)
Calcium: 7.1 mg/dL — ABNORMAL LOW (ref 8.4–10.5)
Creatinine, Ser: 0.44 mg/dL — ABNORMAL LOW (ref 0.50–1.35)
GFR calc Af Amer: >90 mL/min (ref >90)
GFR calc non Af Amer: >90 mL/min (ref >90)
Glucose, Bld: 128 mg/dL — ABNORMAL HIGH (ref 70–99)
Potassium: 3.5 mmol/L (ref 3.5–5.1)
Sodium: 140 mmol/L (ref 135–145)
Total Bilirubin: 0.6 mg/dL (ref 0.3–1.2)
Total Protein: 3.8 g/dL — ABNORMAL LOW (ref 6.0–8.3)

## 2014-12-01 LAB — PREPARE RBC (CROSSMATCH)

## 2014-12-01 MED ORDER — SODIUM CHLORIDE 0.9 % IV SOLN
Freq: Once | INTRAVENOUS | Status: AC
  Administered 2014-12-01: 12:00:00 via INTRAVENOUS

## 2014-12-01 NOTE — Progress Notes (Signed)
Referring Physician(s): GI  Subjective: Patient states since procedure he has had (2) dark black BM last night. He denies any bright red bleeding. He denies any right groin site pain or bleeding. He does admit to epigastric abdominal pain similar to previous days   Allergies: Review of patient's allergies indicates no known allergies.  Medications: Prior to Admission medications   Medication Sig Start Date End Date Taking? Authorizing Provider  ALPRAZolam Duanne Moron) 1 MG tablet Take 1 tablet (1 mg total) by mouth at bedtime as needed for anxiety. 10/15/14  Yes Ladell Pier, MD  ibuprofen (ADVIL,MOTRIN) 200 MG tablet Take 800 mg by mouth every 8 (eight) hours as needed for moderate pain.   Yes Historical Provider, MD  lactose free nutrition (BOOST) LIQD Take 237 mLs by mouth 4 (four) times daily.   Yes Historical Provider, MD  lidocaine-prilocaine (EMLA) cream Apply 1 application topically as needed. Apply to [facility] site 1-2 hours prior to stick and cover with plastic wrap 09/21/14  Yes Ladell Pier, MD  morphine (MS CONTIN) 60 MG 12 hr tablet Take 1 tablet (60 mg total) by mouth every 12 (twelve) hours. 11/05/14  Yes Owens Shark, NP  morphine (MSIR) 15 MG tablet Take 1-2 tablets (15-30 mg total) by mouth every 4 (four) hours as needed for severe pain. 11/13/14  Yes Owens Shark, NP  prochlorperazine (COMPAZINE) 10 MG tablet Take 1 tablet (10 mg total) by mouth every 6 (six) hours as needed for nausea. 09/21/14  Yes Ladell Pier, MD  tamsulosin (FLOMAX) 0.4 MG CAPS capsule Take 1 capsule (0.4 mg total) by mouth daily. 11/26/14  Yes Ladell Pier, MD  dexamethasone (DECADRON) 4 MG tablet Take 2 tablets (8 mg total) by mouth 2 (two) times daily with a meal. Take day after chemo only Patient not taking: Reported on 11/27/2014 10/10/14   Ladell Pier, MD  ondansetron (ZOFRAN) 8 MG tablet Take 1 tablet (8 mg total) by mouth every 8 (eight) hours as needed for nausea or vomiting. Patient  not taking: Reported on 10/31/2014 09/08/14   Kinnie Feil, MD  phenazopyridine (PYRIDIUM) 200 MG tablet Take 1 tablet (200 mg total) by mouth 3 (three) times daily with meals. Patient not taking: Reported on 11/13/2014 10/22/14   Ladell Pier, MD  polyethylene glycol [facility] / Floria Raveling) packet Take 17 g by mouth daily. Patient not taking: Reported on 11/13/2014 09/08/14   Kinnie Feil, MD  senna-docusate (SENOKOT-S) 8.6-50 MG per tablet Take 1 tablet by mouth 2 (two) times daily. Patient not taking: Reported on 11/27/2014 09/08/14   Kinnie Feil, MD    Review of Systems  Vital Signs: BP 131/75 mmHg  Pulse 106  Temp(Src) 97.9 F (36.6 C) (Oral)  Resp 22  Ht 5\' 9"  (1.753 m)  Wt 163 lb (73.936 kg)  BMI 24.06 kg/m2  SpO2 99%  Physical Exam General: A&OX3, NAD, sitting in bed Abd: slightly distended, (+) BS, epigastric tenderness Ext: RCFA dressing C/D/I, NT, no signs of bleeding or hematoma, DP 1+ B/L  Imaging: See chart review-imaging section.   Labs:  CBC:  Recent Labs  11/29/14 0430 11/29/14 1430 11/29/14 1845 11/30/14 0549 11/30/14 2000 12/01/14 0400 12/01/14 0946  WBC 6.2 5.9 8.5 7.1  --   --   --   HGB 8.8* 7.9* 6.7* 5.4* 6.1* 6.9* 6.0*  HCT 26.0* 23.5* 19.4* 15.9* 17.8* 20.2* 17.6*  PLT 238 243 279 222  --   --   --  COAGS:  Recent Labs  09/01/14 1100 11/28/14 0043  INR 1.14 1.37  APTT  --  35    BMP:  Recent Labs  11/28/14 0825 11/29/14 0430 11/30/14 0400 12/01/14 0400  NA 138 138 137 140  K 3.7 3.4* 3.6 3.5  CL 109 107 108 110  CO2 24 24 22 24   GLUCOSE 111* 100* 137* 128*  BUN 16 5* 12 12  CALCIUM 7.2* 7.7* 7.0* 7.1*  CREATININE 0.45* 0.41* 0.49* 0.44*  GFRNONAA >90 >90 >90 >90  GFRAA >90 >90 >90 >90    LIVER FUNCTION TESTS:  Recent Labs  11/28/14 0825 11/29/14 0430 11/30/14 0400 12/01/14 0400  BILITOT 0.5 0.6 0.7 0.6  AST 15 17 20 21   ALT 15 16 15 18   ALKPHOS 44 51 39 37*  PROT 4.4* 5.0* 3.8* 3.8*    ALBUMIN 2.2* 2.5* 1.9* 1.9*    Assessment and Plan: Pancreatic cancer with erosion into duodenum  GI bleed s/p EGD 1/6 revealed friable bleeding mass in duodenum S/p emergent visceral arteriogram with multi-vessel embolization- See full report.  H/H stable, BP stable, check renal function tomorrow (48 hours post arteriogram), receiving blood transfusion today, no signs of active bleeding. GI and Oncology on board-plans for radiation, if continued bleeding may attempt endoscopy using APC laser per GI.    I spent a total of 15 minutes face to face in clinical consultation/evaluation, greater than 50% of which was counseling/coordinating care   Signed: Hedy Jacob 12/01/2014, 12:35 PM

## 2014-12-01 NOTE — Progress Notes (Signed)
CRITICAL VALUE ALERT  Critical value received:  hgb 6.0  Date of notification:  12/01/2014  Time of notification: 9166  Critical value read back:yes  Nurse who received alert:  SDillon RN  MD notified (1st page): has current order to transfuse Time of first page:   MD notified (2nd page):  Time of second page:  Responding MD:   Time MD responded:

## 2014-12-01 NOTE — Progress Notes (Signed)
[facility] PROGRESS NOTE  Richard Cantu GBT:[id number] DOB: 11-Nov-1956 DOA: 11/28/2014 PCP: Ned Card, NP Interim summary: 59 year old male with h/o pancreatic cancer, comes in for GI bleed and hypotension. He received 3 units of prbc transfusion and underwent EGD on 11/28/14. He was found to have a friable mass, probably from invasion of the pancreatic cancer into the duodenum.  Assessment/Plan: 1. GI bleed: Secondary to bleeding from the friable mass in the duodenum, invasion of the pancreatic cancer into the duodenum.   Further drop in bp and hemoglobin. Another 3 units of prbc transfusion ordered. He received a total of 6 unitsof prbc this admission. Transfuse to keep hemoglobin greater than 7.  IR consulted for S/p emergent visceral arteriogram with multi-vessel embolization on 1/8.    2. Hypotension: improved with prbc and fluid boluses  3. Pancreatic cancer: Dr Learta Codding on board. CT abdomen and pelvis showed progression of the cancer with necrotic pancreatic mass likely invaded into the third portion of the duodenum.    4. Anemia of blood loss: hemoglobin is 6.9, ordered another 2 units of prbc transfuson.  Received a total of 6  units of prbc transfusion. Monitor H&h every 6 hours.   5. Hypokalemia: replete as needed.   Code Status: full code Family Communication: discussed with family at bedside Disposition Plan: pending.    Consultants:  Gastroenterology  Oncology  IR  Procedures:  EGD  Embolization procedure by IR  CT ABD and pelvis.   Antibiotics:  NONE  HPI/Subjective: Painbetter controlled with dilaudid   Objective: Filed Vitals:   12/01/14 1229  BP:   Pulse: 106  Temp: 97.9 F (36.6 C)  Resp: 22    Intake/Output Summary (Last 24 hours) at 12/01/14 1344 Last data filed at 12/01/14 1229  Gross per 24 hour  Intake   4610 ml  Output   1425 ml  Net   3185 ml   Filed Weights   11/28/14 0021  Weight: 73.936 kg (163 lb)     Exam:   General:  Alert slightly drowsy  Cardiovascular: s1s2  Respiratory: ctab  Abdomen: soft mildly tender diffuse  Musculoskeletal: no pedal edema.   Data Reviewed: Basic Metabolic Panel:  Recent Labs Lab 11/28/14 0043 11/28/14 0825 11/29/14 0430 11/30/14 0400 12/01/14 0400  NA 136 138 138 137 140  K 3.6 3.7 3.4* 3.6 3.5  CL 103 109 107 108 110  CO2 23 24 24 22 24   GLUCOSE 177* 111* 100* 137* 128*  BUN 19 16 5* 12 12  CREATININE 0.56 0.45* 0.41* 0.49* 0.44*  CALCIUM 7.8* 7.2* 7.7* 7.0* 7.1*   Liver Function Tests:  Recent Labs Lab 11/28/14 0043 11/28/14 0825 11/29/14 0430 11/30/14 0400 12/01/14 0400  AST 18 15 17 20 21   ALT 17 15 16 15 18   ALKPHOS 48 44 51 39 37*  BILITOT 0.5 0.5 0.6 0.7 0.6  PROT 4.9* 4.4* 5.0* 3.8* 3.8*  ALBUMIN 2.5* 2.2* 2.5* 1.9* 1.9*    Recent Labs Lab 11/28/14 0043  LIPASE 660*   No results for input(s): AMMONIA in the last 168 hours. CBC:  Recent Labs Lab 11/28/14 0825 11/28/14 1510 11/29/14 0430 11/29/14 1430 11/29/14 1845 11/30/14 0549 11/30/14 2000 12/01/14 0400 12/01/14 0946  WBC 5.3 5.9 6.2 5.9 8.5 7.1  --   --   --   NEUTROABS 3.4 4.0 4.5 4.0  --  5.3  --   --   --   HGB 7.2* 8.4* 8.8* 7.9* 6.7* 5.4*  6.1* 6.9* 6.0*  HCT 21.3* 25.0* 26.0* 23.5* 19.4* 15.9* 17.8* 20.2* 17.6*  MCV 86.9 87.1 87.0 87.7 86.6 87.8  --   --   --   PLT 200 222 238 243 279 222  --   --   --    Cardiac Enzymes:  Recent Labs Lab 11/28/14 0043  TROPONINI <0.03   BNP (last 3 results) No results for input(s): PROBNP in the last 8760 hours. CBG: No results for input(s): GLUCAP in the last 168 hours.  Recent Results (from the past 240 hour(s))  MRSA PCR Screening     Status: None   Collection Time: 11/28/14  6:42 AM  Result Value Ref Range Status   MRSA by PCR NEGATIVE NEGATIVE Final    Comment:        The GeneXpert MRSA Assay (FDA approved for NASAL specimens only), is one component of a comprehensive MRSA  colonization surveillance program. It is not intended to diagnose MRSA infection nor to guide or monitor treatment for MRSA infections.      Studies: Ct Abdomen Pelvis W Wo Contrast  11/29/2014   CLINICAL DATA:  59 year old male with history of pancreatic cancer diagnosed in October 2015. Abdominal pain. History of chronic pancreatitis. Assess for potential resectability of the mass.  EXAM: CT ABDOMEN AND PELVIS WITHOUT AND WITH CONTRAST  TECHNIQUE: Multidetector CT imaging of the abdomen and pelvis was performed following the standard protocol before and following the bolus administration of intravenous contrast.  CONTRAST:  163mL OMNIPAQUE IOHEXOL 300 MG/ML  SOLN  COMPARISON:  CT of the abdomen and pelvis 08/30/2014.  FINDINGS: Lower chest: Areas of mild scarring or subsegmental atelectasis are noted in the lung bases bilaterally. Small amount of right-sided pleural thickening.  Hepatobiliary: Compared to the prior examination there are now numerous hypovascular hepatic lesions, compatible with widespread metastatic disease to the liver. The largest of these measure up to 3.5 x 1.8 cm in segment 7. Mild periportal edema. Mild intra and extrahepatic biliary ductal dilatation, with common bile duct measuring up to 9 mm in the porta hepatis. Gallbladder appears moderately distended, but is otherwise unremarkable in appearance.  Pancreas: The previously described mass in the head and uncinate process of the pancreas has markedly enlarged and is now centrally necrotic. There is an air-fluid level within the centrally necrotic portion of the mass, which suggests probable erosion of the lesion into the adjacent third portion of the duodenum. This mass is irregular in shape, and therefore difficult to measure, but is estimated to measure approximately 5.7 x 9.5 x 6.0 cm on today's examination. This mass now completely encircles the proximal superior mesenteric artery which is markedly narrowed. The mass comes in  close contact with the posterior aspect of the superior mesenteric vein over approximately 1/3 of the circumference of the vessel, and is intimately associated with the splenoportal confluence, immediately beneath which the superior mesenteric vein is very narrowed and nearly completely occluded, best appreciated on coronal image 51. The splenic vein remains patent at this time, as does the portal vein. Posterior extension of the mass is now causing some compression of the left renal vein which appears markedly narrowed. There is also obscuration of the fat plane between the mass and the adjacent inferior vena cava, best appreciated on axial image 73 of series 3, and coronal image 79.  Spleen: Unremarkable.  Adrenals/Urinary Tract: Bilateral adrenal glands and bilateral kidneys are normal in appearance. No hydroureteronephrosis. Urinary bladder is normal in appearance.  Stomach/Bowel:  Normal appearance of the stomach. 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  Vascular/Lymphatic: Numerous prominent peripancreatic lymph nodes are noted, borderline enlarged and mildly enlarged (up to 12 mm in short axis), presumably metastatic. Other enlarged retroperitoneal lymph nodes measuring up to 21 x 26 mm in the left para-aortic station (image 40 of series 5). Vascular findings relevant to be pancreatic lesion, as above. Atherosclerosis throughout the abdominal and pelvic vasculature, without evidence of aneurysm or dissection.  Reproductive: Prostate gland and seminal vesicles are unremarkable in appearance.  Other: Small volume of ascites.  No pneumoperitoneum.  Musculoskeletal: There are no aggressive appearing lytic or blastic lesions noted in the visualized portions of the skeleton.  IMPRESSION: 1. Today's  study demonstrates progression of disease with marked enlargement of what is now a 5.7 x 9.5 x 6.0 cm centrally necrotic pancreatic mass which has likely invaded into the third portion of the duodenum, with extensive peripancreatic and retroperitoneal lymphadenopathy and numerous new hepatic metastasis, as above. There is vascular involvement, as detailed above. Additionally, this mass appears to be causing mild biliary tract obstruction. 2. Additional incidental findings, as above. These results were called by telephone at the time of interpretation on 11/29/2014 at 2:35 pm to Dr. Betsy Coder , who verbally acknowledged these results.   Electronically Signed   By: Vinnie Langton M.D.   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 Endoscopy on 11/28/2014 demonstrated friable tumor extending into the lumen of the duodenum. CT has demonstrated invasion and encasement of the SMA trunk by the large duodenal tumor.  EXAM: 1. ULTRASOUND GUIDANCE FOR VASCULAR ACCESS OF THE RIGHT COMMON FEMORAL ARTERY 2. SELECTIVE ARTERIOGRAPHY OF THE CELIAC AXIS 3. SELECTIVE ARTERIOGRAPHY OF THE COMMON HEPATIC ARTERY 4. SELECTIVE ARTERIOGRAPHY OF THE GASTRODUODENAL ARTERY 5. SELECTIVE ARTERIOGRAPHY OF A PANCREATICODUODENAL TRUNK OFF OF THE GASTRODUODENAL ARTERY 6. TRANSCATHETER COIL EMBOLIZATION OF PANCREATICODUODENAL TRUNK 7. TRANSCATHETER COIL EMBOLIZATION OF GASTRODUODENAL ARTERY 8. SELECTIVE ARTERIOGRAPHY OF THE SUPERIOR MESENTERIC ARTERY 9. ADDITIONAL SECOND ORDER ARTERIOGRAPHY TIMES TWO OF SUPERIOR MESENTERIC ARTERY BRANCHES 10. TRANSCATHETER COIL EMBOLIZATION OF SUPERIOR MESENTERIC ARTERY  ANESTHESIA/SEDATION: Sedation:  2.5 mg IV Versed sec, 150 mcg IV fentanyl  Total Moderate Sedation Time:  84 minutes.  MEDICATIONS: 1.5 mg IV Dilaudid   CONTRAST:  189mL OMNIPAQUE IOHEXOL 300 MG/ML  SOLN  FLUOROSCOPY TIME:  24 min and 24 seconds.  PROCEDURE: Prior to the procedure, informed consent was obtained from the patient for visceral arteriography with possible transcatheter embolization to treat active upper GI bleeding. Maximal barrier sterile technique was utilized including caps, mask, sterile gowns, sterile gloves, sterile drape, hand hygiene and skin antiseptic.  The right groin was prepped with Betadine. Local anesthesia was provided with 1% lidocaine. A time-out was performed prior to the procedure.  Ultrasound was used to confirm patency of the right common femoral artery. Under direct ultrasound guidance, the artery was accessed with a micropuncture set. A 5 French sheath was placed over a guidewire. A 5 [place] cobra catheter was advanced into the abdominal aorta. This is used to selectively catheterize the celiac axis. Selective arteriography was through the 5 French catheter.  The  5 French catheter was further advanced into the common hepatic artery. Selective arteriography was performed. A micro catheter was then advanced through the 5 French catheter into a pancreaticoduodenal artery emanating off of the proximal gastroduodenal artery. Selective arteriography was performed. Transcatheter embolization of the pancreaticoduodenal branch was then performed utilizing 2 mm and 3 mm diameter Interlock coils. Arteriography was performed through the micro catheter following embolization.  The micro catheter was then redirected into the main trunk of the gastroduodenal artery. Selective arteriography was performed. The gastroduodenal artery was then embolized utilizing interlock coils bearing in diameter from 3 mm up to 5 mm. Arteriography was performed through the micro catheter following embolization.  The micro catheter was removed. The superior mesenteric artery was catheterized with the 5 French catheter. Selective arteriography was performed. A  micro catheter was advanced into a second order SMA branch. Selective arteriography was performed. A micro catheter was then further advanced in the superior mesenteric artery and additional selective arteriography performed. Transcatheter coil embolization was then performed of the superior mesenteric artery with coils ranging in diameter from 5 mm down to 3 mm. Additional arteriography was then performed through the 5 French catheter positioned in the proximal superior mesenteric artery.  Oblique arteriography was performed at the level of right groin access.  COMPLICATIONS: None immediate.  FINDINGS: Celiac arteriography and common hepatic arteriography shows a patent gastroduodenal artery. There is a prominent lateral pancreaticoduodenal branch off of the proximal GDA. Selective arteriography at the level of the pancreaticoduodenal branch demonstrated a focal pseudoaneurysm at the level of the pancreatic head or proximal duodenum. Tumor blush was also identified. This branch was successfully occluded with coils.  The gastroduodenal artery trunk was also embolized successfully with coils resulting in complete angiographic occlusion.  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 Once a micro catheter was advanced into the region of extravasation, contrast injection shows opacification of the duodenum lumen. The bleeding segment of the SMA trunk, including the pseudoaneurysm was able to be successfully occluded by embolization coils. Branch vessel patency was preserved and there is collateral reconstitution of distal branches beyond the SMA trunk. The inferior mesenteric artery is presumably also supplying collateral supply. The IMA was not injected.  IMPRESSION: 1. Transcatheter coil embolization of the gastroduodenal artery as  well as a pancreaticoduodenal branch supplying tumor with evidence of focal pseudoaneurysm in the distribution of the pancreaticoduodenal supply. 2. Tumor erosion of the SMA trunk with pseudoaneurysm formation and overt contrast extravasation into the duodenal lumen. The SMA trunk was embolized with coils, preserve ring proximal branch patency. There is collateral reconstitution of multiple distal branches with SMA injection. The IMA is also presumably supplying additional collateral vessels to bowel.   Electronically Signed   By: Aletta Edouard M.D.   On: 11/30/2014 16:13   Ir Angiogram Visceral Selective  11/30/2014   CLINICAL DATA:  Progression of metastatic pancreatic carcinoma with invasion of the duodenum and active bleeding requiring multiple units of blood transfusion overnight. Endoscopy on 11/28/2014 demonstrated friable tumor extending into the lumen of the duodenum. CT has demonstrated invasion and encasement of the SMA trunk by the large duodenal tumor.  EXAM: 1. ULTRASOUND GUIDANCE FOR VASCULAR ACCESS OF THE RIGHT COMMON FEMORAL ARTERY 2. SELECTIVE ARTERIOGRAPHY OF THE CELIAC AXIS 3. SELECTIVE ARTERIOGRAPHY OF THE COMMON HEPATIC ARTERY 4. SELECTIVE  ARTERIOGRAPHY OF THE GASTRODUODENAL ARTERY 5. SELECTIVE ARTERIOGRAPHY OF A PANCREATICODUODENAL TRUNK OFF OF THE GASTRODUODENAL ARTERY 6. TRANSCATHETER COIL EMBOLIZATION OF PANCREATICODUODENAL TRUNK 7. TRANSCATHETER COIL EMBOLIZATION OF GASTRODUODENAL ARTERY 8. SELECTIVE ARTERIOGRAPHY OF THE SUPERIOR MESENTERIC ARTERY 9. ADDITIONAL SECOND ORDER ARTERIOGRAPHY TIMES TWO OF SUPERIOR MESENTERIC ARTERY BRANCHES 10. TRANSCATHETER COIL EMBOLIZATION OF SUPERIOR MESENTERIC ARTERY  ANESTHESIA/SEDATION: Sedation:  2.5 mg IV Versed sec, 150 mcg IV fentanyl  Total Moderate Sedation Time:  84 minutes.  MEDICATIONS: 1.5 mg IV Dilaudid  CONTRAST:  184mL OMNIPAQUE IOHEXOL 300 MG/ML  SOLN  FLUOROSCOPY TIME:  24 min and 24 seconds.  PROCEDURE: Prior to the procedure,  informed consent was obtained from the patient for visceral arteriography with possible transcatheter embolization to treat active upper GI bleeding. Maximal barrier sterile technique was utilized including caps, mask, sterile gowns, sterile gloves, sterile drape, hand hygiene and skin antiseptic.  The right groin was prepped with Betadine. Local anesthesia was provided with 1% lidocaine. A time-out was performed prior to the procedure.  Ultrasound was used to confirm patency of the right common femoral artery. Under direct ultrasound guidance, the artery was accessed with a micropuncture set. A 5 French sheath was placed over a guidewire. A 5 [place] cobra catheter was advanced into the abdominal aorta. This is used to selectively catheterize the celiac axis. Selective arteriography was through the 5 French catheter.  The 5 French catheter was further advanced into the common hepatic artery. Selective arteriography was performed. A micro catheter was then advanced through the 5 French catheter into a pancreaticoduodenal artery emanating off of the proximal gastroduodenal artery. Selective arteriography was performed. Transcatheter embolization of the pancreaticoduodenal branch was then performed utilizing 2 mm and 3 mm diameter Interlock coils. Arteriography was performed through the micro catheter following embolization.  The micro catheter was then redirected into the main trunk of the gastroduodenal artery. Selective arteriography was performed. The gastroduodenal artery was then embolized utilizing interlock coils bearing in diameter from 3 mm up to 5 mm. Arteriography was performed through the micro catheter following embolization.  The micro catheter was removed. The superior mesenteric artery was catheterized with the 5 French catheter. Selective arteriography was performed. A micro catheter was advanced into a second order SMA branch. Selective arteriography was performed. A micro catheter was then further  advanced in the superior mesenteric artery and additional selective arteriography performed. Transcatheter coil embolization was then performed of the superior mesenteric artery with coils ranging in diameter from 5 mm down to 3 mm. Additional arteriography was then performed through the 5 French catheter positioned in the proximal superior mesenteric artery.  Oblique arteriography was performed at the level of right groin access.  COMPLICATIONS: None immediate.  FINDINGS: Celiac arteriography and common hepatic arteriography shows a patent gastroduodenal artery. There is a prominent lateral pancreaticoduodenal branch off of the proximal GDA. Selective arteriography at the level of the pancreaticoduodenal branch demonstrated a focal pseudoaneurysm at the level of the pancreatic head or proximal duodenum. Tumor blush was also identified. This branch was successfully occluded with coils.  The gastroduodenal artery trunk was also embolized successfully with coils resulting in complete angiographic occlusion.  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 Once a micro catheter was advanced into the region of extravasation, contrast injection shows opacification of the duodenum lumen. The bleeding segment  of the SMA trunk, including the pseudoaneurysm was able to be successfully occluded by embolization coils. Branch vessel patency was preserved and there is collateral reconstitution of distal branches beyond the SMA trunk. The inferior mesenteric artery is presumably also supplying collateral supply. The IMA was not injected.  IMPRESSION: 1. Transcatheter coil embolization of the gastroduodenal artery as well as a pancreaticoduodenal branch supplying tumor with evidence of focal pseudoaneurysm in the distribution of the  pancreaticoduodenal supply. 2. Tumor erosion of the SMA trunk with pseudoaneurysm formation and overt contrast extravasation into the duodenal lumen. The SMA trunk was embolized with coils, preserve ring proximal branch patency. There is collateral reconstitution of multiple distal branches with SMA injection. The IMA is also presumably supplying additional collateral vessels to bowel.   Electronically Signed   By: Aletta Edouard M.D.   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 Endoscopy on 11/28/2014 demonstrated friable tumor extending into the lumen of the duodenum. CT has demonstrated invasion and encasement of the SMA trunk by the large duodenal tumor.  EXAM: 1. ULTRASOUND GUIDANCE FOR VASCULAR ACCESS OF THE RIGHT COMMON FEMORAL ARTERY 2. SELECTIVE ARTERIOGRAPHY OF THE CELIAC AXIS 3. SELECTIVE ARTERIOGRAPHY OF THE COMMON HEPATIC ARTERY 4. SELECTIVE ARTERIOGRAPHY OF THE GASTRODUODENAL ARTERY 5. SELECTIVE ARTERIOGRAPHY OF A PANCREATICODUODENAL TRUNK OFF OF THE GASTRODUODENAL ARTERY 6. TRANSCATHETER COIL EMBOLIZATION OF PANCREATICODUODENAL TRUNK 7. TRANSCATHETER COIL EMBOLIZATION OF GASTRODUODENAL ARTERY 8. SELECTIVE ARTERIOGRAPHY OF THE SUPERIOR MESENTERIC ARTERY 9. ADDITIONAL SECOND ORDER ARTERIOGRAPHY TIMES TWO OF SUPERIOR MESENTERIC ARTERY BRANCHES 10. TRANSCATHETER COIL EMBOLIZATION OF SUPERIOR MESENTERIC ARTERY  ANESTHESIA/SEDATION: Sedation:  2.5 mg IV Versed sec, 150 mcg IV fentanyl  Total Moderate Sedation Time:  84 minutes.  MEDICATIONS: 1.5 mg IV Dilaudid  CONTRAST:  170mL OMNIPAQUE IOHEXOL 300 MG/ML  SOLN  FLUOROSCOPY TIME:  24 min and 24 seconds.  PROCEDURE: Prior to the procedure, informed consent was obtained from the patient for visceral arteriography with possible transcatheter  embolization to treat active upper GI bleeding. Maximal barrier sterile technique was utilized including caps, mask, sterile gowns, sterile gloves, sterile drape, hand hygiene and skin antiseptic.  The right groin was prepped with Betadine. Local anesthesia was provided with 1% lidocaine. A time-out was performed prior to the procedure.  Ultrasound was used to confirm patency of the right common femoral artery. Under direct ultrasound guidance, the artery was accessed with a micropuncture set. A 5 French sheath was placed over a guidewire. A 5 [place] cobra catheter was advanced into the abdominal aorta. This is used to selectively catheterize the celiac axis. Selective arteriography was through the 5 French catheter.  The 5 French catheter was further advanced into the common hepatic artery. Selective arteriography was performed. A micro catheter was then advanced through the 5 French catheter into a pancreaticoduodenal artery emanating off of the proximal gastroduodenal artery. Selective arteriography was performed. Transcatheter embolization of the pancreaticoduodenal branch was then performed utilizing 2 mm and 3 mm diameter Interlock coils. Arteriography was performed through the micro catheter following embolization.  The micro catheter was then redirected into the main trunk of the gastroduodenal artery. Selective arteriography was performed. The gastroduodenal artery was then embolized utilizing interlock coils bearing in diameter from 3 mm up to 5 mm. Arteriography was performed through the micro catheter following embolization.  The micro catheter was  removed. The superior mesenteric artery was catheterized with the 5 French catheter. Selective arteriography was performed. A micro catheter was advanced into a second order SMA branch. Selective arteriography was performed. A micro catheter was then further advanced in the superior mesenteric artery and additional selective arteriography performed.  Transcatheter coil embolization was then performed of the superior mesenteric artery with coils ranging in diameter from 5 mm down to 3 mm. Additional arteriography was then performed through the 5 French catheter positioned in the proximal superior mesenteric artery.  Oblique arteriography was performed at the level of right groin access.  COMPLICATIONS: None immediate.  FINDINGS: Celiac arteriography and common hepatic arteriography shows a patent gastroduodenal artery. There is a prominent lateral pancreaticoduodenal branch off of the proximal GDA. Selective arteriography at the level of the pancreaticoduodenal branch demonstrated a focal pseudoaneurysm at the level of the pancreatic head or proximal duodenum. Tumor blush was also identified. This branch was successfully occluded with coils.  The gastroduodenal artery trunk was also embolized successfully with coils resulting in complete angiographic occlusion.  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 Once a micro catheter was advanced into the region of extravasation, contrast injection shows opacification of the duodenum lumen. The bleeding segment of the SMA trunk, including the pseudoaneurysm was able to be successfully occluded by embolization coils. Branch vessel patency was preserved and there is collateral reconstitution of distal branches beyond the SMA trunk. The inferior mesenteric artery is presumably also supplying collateral supply. The IMA was not injected.  IMPRESSION: 1. 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 2. Tumor erosion of the SMA trunk with pseudoaneurysm formation and overt contrast  extravasation into the duodenal lumen. The SMA trunk was embolized with coils, preserve ring proximal branch patency. There is collateral reconstitution of multiple distal branches with SMA injection. The IMA is also presumably supplying additional collateral vessels to bowel.   Electronically Signed   By: Aletta Edouard M.D.   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 Endoscopy on 11/28/2014 demonstrated friable tumor extending into the lumen of the duodenum. CT has demonstrated invasion and encasement of the SMA trunk by the large duodenal tumor.  EXAM: 1. ULTRASOUND GUIDANCE FOR VASCULAR ACCESS OF THE RIGHT COMMON FEMORAL ARTERY 2. SELECTIVE ARTERIOGRAPHY OF THE CELIAC AXIS 3. SELECTIVE ARTERIOGRAPHY OF THE COMMON HEPATIC ARTERY 4. SELECTIVE ARTERIOGRAPHY OF THE GASTRODUODENAL ARTERY 5. SELECTIVE ARTERIOGRAPHY OF A PANCREATICODUODENAL TRUNK OFF OF THE GASTRODUODENAL ARTERY 6. TRANSCATHETER COIL EMBOLIZATION OF PANCREATICODUODENAL TRUNK 7. TRANSCATHETER COIL EMBOLIZATION OF GASTRODUODENAL ARTERY 8. SELECTIVE ARTERIOGRAPHY OF THE SUPERIOR MESENTERIC ARTERY 9. ADDITIONAL SECOND ORDER ARTERIOGRAPHY TIMES TWO OF SUPERIOR MESENTERIC ARTERY BRANCHES 10. TRANSCATHETER COIL EMBOLIZATION OF SUPERIOR MESENTERIC ARTERY  ANESTHESIA/SEDATION: Sedation:  2.5 mg IV Versed sec, 150 mcg IV fentanyl  Total Moderate Sedation Time:  84 minutes.  MEDICATIONS: 1.5 mg IV Dilaudid  CONTRAST:  154mL OMNIPAQUE IOHEXOL 300 MG/ML  SOLN  FLUOROSCOPY TIME:  24 min and 24 seconds.  PROCEDURE: Prior to the procedure, informed consent was obtained from the patient  for visceral arteriography with possible transcatheter embolization to treat active upper GI bleeding. Maximal barrier sterile technique was utilized including caps, mask,  sterile gowns, sterile gloves, sterile drape, hand hygiene and skin antiseptic.  The right groin was prepped with Betadine. Local anesthesia was provided with 1% lidocaine. A time-out was performed prior to the procedure.  Ultrasound was used to confirm patency of the right common femoral artery. Under direct ultrasound guidance, the artery was accessed with a micropuncture set. A 5 French sheath was placed over a guidewire. A 5 [place] cobra catheter was advanced into the abdominal aorta. This is used to selectively catheterize the celiac axis. Selective arteriography was through the 5 French catheter.  The 5 French catheter was further advanced into the common hepatic artery. Selective arteriography was performed. A micro catheter was then advanced through the 5 French catheter into a pancreaticoduodenal artery emanating off of the proximal gastroduodenal artery. Selective arteriography was performed. Transcatheter embolization of the pancreaticoduodenal branch was then performed utilizing 2 mm and 3 mm diameter Interlock coils. Arteriography was performed through the micro catheter following embolization.  The micro catheter was then redirected into the main trunk of the gastroduodenal artery. Selective arteriography was performed. The gastroduodenal artery was then embolized utilizing interlock coils bearing in diameter from 3 mm up to 5 mm. Arteriography was performed through the micro catheter following embolization.  The micro catheter was removed. The superior mesenteric artery was catheterized with the 5 French catheter. Selective arteriography was performed. A micro catheter was advanced into a second order SMA branch. Selective arteriography was performed. A micro catheter was then further advanced in the superior mesenteric artery and additional selective arteriography performed. Transcatheter coil embolization was then performed of the superior mesenteric artery with coils ranging in diameter from 5 mm  down to 3 mm. Additional arteriography was then performed through the 5 French catheter positioned in the proximal superior mesenteric artery.  Oblique arteriography was performed at the level of right groin access.  COMPLICATIONS: None immediate.  FINDINGS: Celiac arteriography and common hepatic arteriography shows a patent gastroduodenal artery. There is a prominent lateral pancreaticoduodenal branch off of the proximal GDA. Selective arteriography at the level of the pancreaticoduodenal branch demonstrated a focal pseudoaneurysm at the level of the pancreatic head or proximal duodenum. Tumor blush was also identified. This branch was successfully occluded with coils.  The gastroduodenal artery trunk was also embolized successfully with coils resulting in complete angiographic occlusion.  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 Once a micro catheter was advanced into the region of extravasation, contrast injection shows opacification of the duodenum lumen. The bleeding segment of the SMA trunk, including the pseudoaneurysm was able to be successfully occluded by embolization coils. Branch vessel patency was preserved and there is collateral reconstitution of distal branches beyond the SMA trunk. The inferior mesenteric artery is presumably also supplying collateral supply. The IMA was not injected.  IMPRESSION: 1. 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 2. Tumor erosion of the SMA trunk with pseudoaneurysm formation and overt contrast extravasation into the duodenal lumen. The SMA trunk was embolized with coils, preserve ring proximal branch patency. There is  collateral reconstitution of multiple distal branches with  SMA injection. The IMA is also presumably supplying additional collateral vessels to bowel.   Electronically Signed   By: Aletta Edouard M.D.   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 Endoscopy on 11/28/2014 demonstrated friable tumor extending into the lumen of the duodenum. CT has demonstrated invasion and encasement of the SMA trunk by the large duodenal tumor.  EXAM: 1. ULTRASOUND GUIDANCE FOR VASCULAR ACCESS OF THE RIGHT COMMON FEMORAL ARTERY 2. SELECTIVE ARTERIOGRAPHY OF THE CELIAC AXIS 3. SELECTIVE ARTERIOGRAPHY OF THE COMMON HEPATIC ARTERY 4. SELECTIVE ARTERIOGRAPHY OF THE GASTRODUODENAL ARTERY 5. SELECTIVE ARTERIOGRAPHY OF A PANCREATICODUODENAL TRUNK OFF OF THE GASTRODUODENAL ARTERY 6. TRANSCATHETER COIL EMBOLIZATION OF PANCREATICODUODENAL TRUNK 7. TRANSCATHETER COIL EMBOLIZATION OF GASTRODUODENAL ARTERY 8. SELECTIVE ARTERIOGRAPHY OF THE SUPERIOR MESENTERIC ARTERY 9. ADDITIONAL SECOND ORDER ARTERIOGRAPHY TIMES TWO OF SUPERIOR MESENTERIC ARTERY BRANCHES 10. TRANSCATHETER COIL EMBOLIZATION OF SUPERIOR MESENTERIC ARTERY  ANESTHESIA/SEDATION: Sedation:  2.5 mg IV Versed sec, 150 mcg IV fentanyl  Total Moderate Sedation Time:  84 minutes.  MEDICATIONS: 1.5 mg IV Dilaudid  CONTRAST:  122mL OMNIPAQUE IOHEXOL 300 MG/ML  SOLN  FLUOROSCOPY TIME:  24 min and 24 seconds.  PROCEDURE: Prior to the procedure, informed consent was obtained from the patient for visceral arteriography with possible transcatheter embolization to treat active upper GI bleeding. Maximal barrier sterile technique was utilized including caps, mask, sterile gowns, sterile gloves, sterile drape, hand hygiene and skin antiseptic.  The right groin was prepped with Betadine. Local  anesthesia was provided with 1% lidocaine. A time-out was performed prior to the procedure.  Ultrasound was used to confirm patency of the right common femoral artery. Under direct ultrasound guidance, the artery was accessed with a micropuncture set. A 5 French sheath was placed over a guidewire. A 5 [place] cobra catheter was advanced into the abdominal aorta. This is used to selectively catheterize the celiac axis. Selective arteriography was through the 5 French catheter.  The 5 French catheter was further advanced into the common hepatic artery. Selective arteriography was performed. A micro catheter was then advanced through the 5 French catheter into a pancreaticoduodenal artery emanating off of the proximal gastroduodenal artery. Selective arteriography was performed. Transcatheter embolization of the pancreaticoduodenal branch was then performed utilizing 2 mm and 3 mm diameter Interlock coils. Arteriography was performed through the micro catheter following embolization.  The micro catheter was then redirected into the main trunk of the gastroduodenal artery. Selective arteriography was performed. The gastroduodenal artery was then embolized utilizing interlock coils bearing in diameter from 3 mm up to 5 mm. Arteriography was performed through the micro catheter following embolization.  The micro catheter was removed. The superior mesenteric artery was catheterized with the 5 French catheter. Selective arteriography was performed. A micro catheter was advanced into a second order SMA branch. Selective arteriography was performed. A micro catheter was then further advanced in the superior mesenteric artery and additional selective arteriography performed. Transcatheter coil embolization was then performed of the superior mesenteric artery with coils ranging in diameter from 5 mm down to 3 mm. Additional arteriography was then performed through the 5 French catheter positioned in the proximal superior  mesenteric artery.  Oblique arteriography was performed at the level of right groin access.  COMPLICATIONS: None immediate.  FINDINGS: Celiac arteriography and common hepatic arteriography shows a patent gastroduodenal artery.  There is a prominent lateral pancreaticoduodenal branch off of the proximal GDA. Selective arteriography at the level of the pancreaticoduodenal branch demonstrated a focal pseudoaneurysm at the level of the pancreatic head or proximal duodenum. Tumor blush was also identified. This branch was successfully occluded with coils.  The gastroduodenal artery trunk was also embolized successfully with coils resulting in complete angiographic occlusion.  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 Once a micro catheter was advanced into the region of extravasation, contrast injection shows opacification of the duodenum lumen. The bleeding segment of the SMA trunk, including the pseudoaneurysm was able to be successfully occluded by embolization coils. Branch vessel patency was preserved and there is collateral reconstitution of distal branches beyond the SMA trunk. The inferior mesenteric artery is presumably also supplying collateral supply. The IMA was not injected.  IMPRESSION: 1. 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 2. Tumor erosion of the SMA trunk with pseudoaneurysm formation and overt contrast extravasation into the duodenal lumen. The SMA trunk was embolized with coils, preserve ring proximal branch patency. There is collateral reconstitution of multiple distal branches with SMA injection. The IMA is also presumably supplying additional  collateral vessels to bowel.   Electronically Signed   By: Aletta Edouard M.D.   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 Endoscopy on 11/28/2014 demonstrated friable tumor extending into the lumen of the duodenum. CT has demonstrated invasion and encasement of the SMA trunk by the large duodenal tumor.  EXAM: 1. ULTRASOUND GUIDANCE FOR VASCULAR ACCESS OF THE RIGHT COMMON FEMORAL ARTERY 2. SELECTIVE ARTERIOGRAPHY OF THE CELIAC AXIS 3. SELECTIVE ARTERIOGRAPHY OF THE COMMON HEPATIC ARTERY 4. SELECTIVE ARTERIOGRAPHY OF THE GASTRODUODENAL ARTERY 5. SELECTIVE ARTERIOGRAPHY OF A PANCREATICODUODENAL TRUNK OFF OF THE GASTRODUODENAL ARTERY 6. TRANSCATHETER COIL EMBOLIZATION OF PANCREATICODUODENAL TRUNK 7. TRANSCATHETER COIL EMBOLIZATION OF GASTRODUODENAL ARTERY 8. SELECTIVE ARTERIOGRAPHY OF THE SUPERIOR MESENTERIC ARTERY 9. ADDITIONAL SECOND ORDER ARTERIOGRAPHY TIMES TWO OF SUPERIOR MESENTERIC ARTERY BRANCHES 10. TRANSCATHETER COIL EMBOLIZATION OF SUPERIOR MESENTERIC ARTERY  ANESTHESIA/SEDATION: Sedation:  2.5 mg IV Versed sec, 150 mcg IV fentanyl  Total Moderate Sedation Time:  84 minutes.  MEDICATIONS: 1.5 mg IV Dilaudid  CONTRAST:  160mL OMNIPAQUE IOHEXOL 300 MG/ML  SOLN  FLUOROSCOPY TIME:  24 min and 24 seconds.  PROCEDURE: Prior to the procedure, informed consent was obtained from the patient for visceral arteriography with possible transcatheter embolization to treat active upper GI bleeding. Maximal barrier sterile technique was utilized including caps, mask, sterile gowns, sterile gloves, sterile drape, hand hygiene and skin antiseptic.  The right groin was prepped with Betadine. Local anesthesia was provided with 1% lidocaine. A time-out was performed prior to the procedure.  Ultrasound was used to  confirm patency of the right common femoral artery. Under direct ultrasound guidance, the artery was accessed with a micropuncture set. A 5 French sheath was placed over a guidewire. A 5 [place] cobra catheter was advanced into the abdominal aorta. This is used to selectively catheterize the celiac axis. Selective arteriography was through the 5 [place]  catheter.  The 5 French catheter was further advanced into the common hepatic artery. Selective arteriography was performed. A micro catheter was then advanced through the 5 French catheter into a pancreaticoduodenal artery emanating off of the proximal gastroduodenal artery. Selective arteriography was performed. Transcatheter embolization of the pancreaticoduodenal branch was then performed utilizing 2 mm and 3 mm diameter Interlock coils. Arteriography was performed through the micro catheter following embolization.  The micro catheter was then redirected into the main trunk of the gastroduodenal artery. Selective arteriography was performed. The gastroduodenal artery was then embolized utilizing interlock coils bearing in diameter from 3 mm up to 5 mm. Arteriography was performed through the micro catheter following embolization.  The micro catheter was removed. The superior mesenteric artery was catheterized with the 5 French catheter. Selective arteriography was performed. A micro catheter was advanced into a second order SMA branch. Selective arteriography was performed. A micro catheter was then further advanced in the superior mesenteric artery and additional selective arteriography performed. Transcatheter coil embolization was then performed of the superior mesenteric artery with coils ranging in diameter from 5 mm down to 3 mm. Additional arteriography was then performed through the 5 French catheter positioned in the proximal superior mesenteric artery.  Oblique arteriography was performed at the level of right groin access.  COMPLICATIONS: None immediate.   FINDINGS: Celiac arteriography and common hepatic arteriography shows a patent gastroduodenal artery. There is a prominent lateral pancreaticoduodenal branch off of the proximal GDA. Selective arteriography at the level of the pancreaticoduodenal branch demonstrated a focal pseudoaneurysm at the level of the pancreatic head or proximal duodenum. Tumor blush was also identified. This branch was successfully occluded with coils.  The gastroduodenal artery trunk was also embolized successfully with coils resulting in complete angiographic occlusion.  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 Once a micro catheter was advanced into the region of extravasation, contrast injection shows opacification of the duodenum lumen. The bleeding segment of the SMA trunk, including the pseudoaneurysm was able to be successfully occluded by embolization coils. Branch vessel patency was preserved and there is collateral reconstitution of distal branches beyond the SMA trunk. The inferior mesenteric artery is presumably also supplying collateral supply. The IMA was not injected.  IMPRESSION: 1. 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 2. Tumor erosion of the SMA trunk with pseudoaneurysm formation and overt contrast extravasation into the duodenal lumen. The SMA trunk was embolized with coils, preserve ring proximal branch patency. There is collateral reconstitution of multiple distal branches with SMA injection. The IMA is also presumably supplying additional collateral vessels to bowel.   Electronically Signed   By: Aletta Edouard M.D.   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 Endoscopy on 11/28/2014 demonstrated friable tumor extending into the lumen of the duodenum. CT has demonstrated invasion and encasement of the SMA trunk by the large duodenal tumor.  EXAM: 1. ULTRASOUND GUIDANCE FOR VASCULAR ACCESS OF THE RIGHT COMMON FEMORAL ARTERY 2. SELECTIVE ARTERIOGRAPHY OF THE CELIAC AXIS 3. SELECTIVE ARTERIOGRAPHY OF THE  COMMON HEPATIC ARTERY 4. SELECTIVE ARTERIOGRAPHY OF THE GASTRODUODENAL ARTERY 5. SELECTIVE ARTERIOGRAPHY OF A PANCREATICODUODENAL TRUNK OFF OF THE GASTRODUODENAL ARTERY 6. TRANSCATHETER COIL EMBOLIZATION OF PANCREATICODUODENAL TRUNK 7. TRANSCATHETER COIL EMBOLIZATION OF GASTRODUODENAL ARTERY 8. SELECTIVE ARTERIOGRAPHY OF THE SUPERIOR MESENTERIC ARTERY 9. ADDITIONAL SECOND ORDER ARTERIOGRAPHY TIMES TWO OF SUPERIOR MESENTERIC ARTERY BRANCHES 10. TRANSCATHETER COIL EMBOLIZATION OF SUPERIOR MESENTERIC ARTERY  ANESTHESIA/SEDATION: Sedation:  2.5 mg IV Versed sec, 150 mcg IV fentanyl  Total Moderate Sedation Time:  84 minutes.  MEDICATIONS: 1.5 mg IV Dilaudid  CONTRAST:  136mL OMNIPAQUE IOHEXOL 300 MG/ML  SOLN  FLUOROSCOPY TIME:  24 min and 24 seconds.  PROCEDURE: Prior to the procedure, informed consent was obtained from the patient for visceral arteriography with possible transcatheter embolization to treat active upper GI bleeding. Maximal barrier sterile technique was utilized including caps, mask, sterile gowns, sterile gloves, sterile drape, hand hygiene and skin antiseptic.  The right groin was prepped with Betadine. Local anesthesia was provided with 1% lidocaine. A time-out was performed prior to the procedure.  Ultrasound was used to confirm patency of the right common femoral artery. Under direct ultrasound guidance, the artery was accessed with a  micropuncture set. A 5 French sheath was placed over a guidewire. A 5 [place] cobra catheter was advanced into the abdominal aorta. This is used to selectively catheterize the celiac axis. Selective arteriography was through the 5 French catheter.  The 5 French catheter was further advanced into the common hepatic artery. Selective arteriography was performed. A micro catheter was then advanced through the 5 French catheter into a pancreaticoduodenal artery emanating off of the proximal gastroduodenal artery. Selective arteriography was performed. Transcatheter embolization of the pancreaticoduodenal branch was then performed utilizing 2 mm and 3 mm diameter Interlock coils. Arteriography was performed through the micro catheter following embolization.  The micro catheter was then redirected into the main trunk of the gastroduodenal artery. Selective arteriography was performed. The gastroduodenal artery was then embolized utilizing interlock coils bearing in diameter from 3 mm up to 5 mm. Arteriography was performed through the micro catheter following embolization.  The micro catheter was removed. The superior mesenteric artery was catheterized with the 5 French catheter. Selective arteriography was performed. A micro catheter was advanced into a second order SMA branch. Selective arteriography was performed. A micro catheter was then further advanced in the superior mesenteric artery and additional selective arteriography performed. Transcatheter coil embolization was then performed of the superior mesenteric artery with coils ranging in diameter from 5 mm down to 3 mm. Additional arteriography was then performed through the 5 French catheter positioned in the proximal superior mesenteric artery.  Oblique arteriography was performed at the level of right groin access.  COMPLICATIONS: None immediate.  FINDINGS: Celiac arteriography and common hepatic arteriography shows a patent gastroduodenal artery. There is a  prominent lateral pancreaticoduodenal branch off of the proximal GDA. Selective arteriography at the level of the pancreaticoduodenal branch demonstrated a focal pseudoaneurysm at the level of the pancreatic head or proximal duodenum. Tumor blush was also identified. This branch was successfully occluded with coils.  The gastroduodenal artery trunk was also embolized successfully with coils resulting in complete angiographic occlusion.  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 Once a micro catheter was advanced into the region of extravasation, contrast injection shows opacification of the  duodenum lumen. The bleeding segment of the SMA trunk, including the pseudoaneurysm was able to be successfully occluded by embolization coils. Branch vessel patency was preserved and there is collateral reconstitution of distal branches beyond the SMA trunk. The inferior mesenteric artery is presumably also supplying collateral supply. The IMA was not injected.  IMPRESSION: 1. 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 2. Tumor erosion of the SMA trunk with pseudoaneurysm formation and overt contrast extravasation into the duodenal lumen. The SMA trunk was embolized with coils, preserve ring proximal branch patency. There is collateral reconstitution of multiple distal branches with SMA injection. The IMA is also presumably supplying additional collateral vessels to bowel.   Electronically Signed   By: Aletta Edouard M.D.   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 Endoscopy on 11/28/2014 demonstrated friable tumor extending into the lumen of the duodenum. CT has demonstrated invasion and encasement of the SMA trunk by the large duodenal tumor.  EXAM: 1. ULTRASOUND GUIDANCE FOR VASCULAR ACCESS OF THE RIGHT COMMON FEMORAL ARTERY 2. SELECTIVE ARTERIOGRAPHY OF THE CELIAC AXIS 3. SELECTIVE ARTERIOGRAPHY OF THE COMMON HEPATIC ARTERY 4. SELECTIVE ARTERIOGRAPHY OF THE GASTRODUODENAL ARTERY 5. SELECTIVE ARTERIOGRAPHY OF A PANCREATICODUODENAL TRUNK OFF OF THE GASTRODUODENAL ARTERY 6. TRANSCATHETER COIL EMBOLIZATION OF PANCREATICODUODENAL TRUNK 7. TRANSCATHETER COIL EMBOLIZATION OF GASTRODUODENAL ARTERY 8. SELECTIVE ARTERIOGRAPHY OF THE SUPERIOR MESENTERIC ARTERY 9. ADDITIONAL SECOND ORDER ARTERIOGRAPHY TIMES TWO OF SUPERIOR MESENTERIC ARTERY BRANCHES 10. TRANSCATHETER COIL EMBOLIZATION OF SUPERIOR MESENTERIC ARTERY  ANESTHESIA/SEDATION: Sedation:  2.5 mg IV Versed sec, 150 mcg IV fentanyl  Total Moderate Sedation Time:  84 minutes.  MEDICATIONS: 1.5 mg IV Dilaudid  CONTRAST:  15mL OMNIPAQUE IOHEXOL 300 MG/ML  SOLN  FLUOROSCOPY TIME:  24 min and 24 seconds.  PROCEDURE: Prior to the procedure, informed consent was obtained from the patient for visceral arteriography with possible transcatheter embolization to treat active upper GI bleeding. Maximal barrier sterile technique was utilized including caps, mask, sterile gowns, sterile gloves, sterile drape, hand hygiene and skin antiseptic.  The right groin was prepped with Betadine. Local anesthesia was provided with 1% lidocaine. A time-out was performed prior to the procedure.  Ultrasound was used to confirm patency of the right common femoral artery. Under direct ultrasound guidance, the artery was accessed with a micropuncture set. A 5 French sheath was placed over a guidewire. A 5 [place] cobra catheter was advanced into the abdominal aorta. This is  used to selectively catheterize the celiac axis. Selective arteriography was through the 5 French catheter.  The 5 French catheter was further advanced into the common hepatic artery. Selective arteriography was performed. A micro catheter was then advanced through the 5 French catheter into a pancreaticoduodenal artery emanating off of the proximal gastroduodenal artery. Selective arteriography was performed. Transcatheter embolization of the pancreaticoduodenal branch was then performed utilizing 2 mm and 3 mm diameter Interlock coils. Arteriography was performed through the micro catheter following embolization.  The micro catheter was then redirected into the main trunk of the gastroduodenal artery. Selective arteriography was performed. The gastroduodenal artery was then embolized utilizing interlock coils bearing in diameter from 3 mm up to 5 mm. Arteriography was performed through the micro catheter following embolization.  The micro catheter was removed. The superior mesenteric artery was catheterized with the 5 French catheter. Selective arteriography was performed. A micro catheter was advanced into a second order SMA branch. Selective arteriography was performed. A micro catheter was then further advanced in the superior mesenteric artery and additional selective arteriography performed. Transcatheter coil embolization was then performed of the superior mesenteric artery with coils ranging in diameter from 5 mm down to 3 mm. Additional arteriography was then performed through the 5 French catheter positioned in the proximal superior mesenteric artery.  Oblique arteriography was performed at the level of right groin access.  COMPLICATIONS: None immediate.  FINDINGS: Celiac arteriography and common hepatic arteriography shows a patent gastroduodenal artery. There is a prominent lateral pancreaticoduodenal branch off of the proximal GDA. Selective arteriography at the level of the pancreaticoduodenal branch  demonstrated a focal pseudoaneurysm at the level of the pancreatic head or proximal duodenum. Tumor blush was also identified. This branch was successfully occluded with coils.  The gastroduodenal artery trunk was also embolized successfully with coils resulting in complete angiographic occlusion.  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 Once a micro catheter was advanced into the region of extravasation, contrast injection shows opacification of the duodenum lumen. The bleeding segment of the SMA trunk, including the pseudoaneurysm was able to be successfully occluded by embolization coils. Branch vessel patency was preserved and there is collateral reconstitution of distal branches beyond the SMA trunk. The inferior mesenteric artery is presumably also supplying collateral supply. The IMA was not injected.  IMPRESSION: 1. 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 2. Tumor erosion of the SMA trunk with pseudoaneurysm formation and overt contrast extravasation into the duodenal lumen. The SMA trunk was embolized with coils, preserve ring proximal branch patency. There is collateral reconstitution of multiple distal branches with SMA injection. The IMA is also presumably supplying additional collateral vessels to bowel.   Electronically Signed   By: Aletta Edouard M.D.   On: 11/30/2014 16:13   [place] Guide Roadmapping  11/30/2014   CLINICAL DATA:  Progression of metastatic pancreatic carcinoma with invasion of the duodenum and active bleeding requiring multiple units of blood transfusion overnight. Endoscopy on 11/28/2014  demonstrated friable tumor extending into the lumen of the duodenum. CT has demonstrated invasion and encasement of the SMA trunk by the large duodenal tumor.  EXAM: 1. ULTRASOUND GUIDANCE FOR VASCULAR ACCESS OF THE RIGHT COMMON FEMORAL ARTERY 2. SELECTIVE ARTERIOGRAPHY OF THE CELIAC AXIS 3. SELECTIVE ARTERIOGRAPHY OF THE COMMON HEPATIC ARTERY 4. SELECTIVE ARTERIOGRAPHY OF THE GASTRODUODENAL ARTERY 5. SELECTIVE ARTERIOGRAPHY OF A PANCREATICODUODENAL TRUNK OFF OF THE GASTRODUODENAL ARTERY 6. TRANSCATHETER COIL EMBOLIZATION OF PANCREATICODUODENAL TRUNK 7. TRANSCATHETER COIL EMBOLIZATION OF GASTRODUODENAL ARTERY 8. SELECTIVE ARTERIOGRAPHY OF THE SUPERIOR MESENTERIC ARTERY 9. ADDITIONAL SECOND ORDER ARTERIOGRAPHY TIMES TWO OF SUPERIOR MESENTERIC ARTERY BRANCHES 10. TRANSCATHETER COIL EMBOLIZATION OF SUPERIOR MESENTERIC ARTERY  ANESTHESIA/SEDATION: Sedation:  2.5 mg IV Versed sec, 150 mcg IV fentanyl  Total Moderate Sedation Time:  84 minutes.  MEDICATIONS: 1.5 mg IV Dilaudid  CONTRAST:  138mL OMNIPAQUE IOHEXOL 300 MG/ML  SOLN  FLUOROSCOPY TIME:  24 min and 24 seconds.  PROCEDURE: Prior  to the procedure, informed consent was obtained from the patient for visceral arteriography with possible transcatheter embolization to treat active upper GI bleeding. Maximal barrier sterile technique was utilized including caps, mask, sterile gowns, sterile gloves, sterile drape, hand hygiene and skin antiseptic.  The right groin was prepped with Betadine. Local anesthesia was provided with 1% lidocaine. A time-out was performed prior to the procedure.  Ultrasound was used to confirm patency of the right common femoral artery. Under direct ultrasound guidance, the artery was accessed with a micropuncture set. A 5 French sheath was placed over a guidewire. A 5 [place] cobra catheter was advanced into the abdominal aorta. This is used to selectively catheterize the celiac axis. Selective arteriography was through the 5 French catheter.   The 5 French catheter was further advanced into the common hepatic artery. Selective arteriography was performed. A micro catheter was then advanced through the 5 French catheter into a pancreaticoduodenal artery emanating off of the proximal gastroduodenal artery. Selective arteriography was performed. Transcatheter embolization of the pancreaticoduodenal branch was then performed utilizing 2 mm and 3 mm diameter Interlock coils. Arteriography was performed through the micro catheter following embolization.  The micro catheter was then redirected into the main trunk of the gastroduodenal artery. Selective arteriography was performed. The gastroduodenal artery was then embolized utilizing interlock coils bearing in diameter from 3 mm up to 5 mm. Arteriography was performed through the micro catheter following embolization.  The micro catheter was removed. The superior mesenteric artery was catheterized with the 5 French catheter. Selective arteriography was performed. A micro catheter was advanced into a second order SMA branch. Selective arteriography was performed. A micro catheter was then further advanced in the superior mesenteric artery and additional selective arteriography performed. Transcatheter coil embolization was then performed of the superior mesenteric artery with coils ranging in diameter from 5 mm down to 3 mm. Additional arteriography was then performed through the 5 French catheter positioned in the proximal superior mesenteric artery.  Oblique arteriography was performed at the level of right groin access.  COMPLICATIONS: None immediate.  FINDINGS: Celiac arteriography and common hepatic arteriography shows a patent gastroduodenal artery. There is a prominent lateral pancreaticoduodenal branch off of the proximal GDA. Selective arteriography at the level of the pancreaticoduodenal branch demonstrated a focal pseudoaneurysm at the level of the pancreatic head or proximal duodenum. Tumor blush was  also identified. This branch was successfully occluded with coils.  The gastroduodenal artery trunk was also embolized successfully with coils resulting in complete angiographic occlusion.  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 Once a micro catheter was advanced into the region of extravasation, contrast injection shows opacification of the duodenum lumen. The bleeding segment of the SMA trunk, including the pseudoaneurysm was able to be successfully occluded by embolization coils. Branch vessel patency was preserved and there is collateral reconstitution of distal branches beyond the SMA trunk. The inferior mesenteric artery is presumably also supplying collateral supply. The IMA was not injected.  IMPRESSION: 1. 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 2. Tumor erosion of the SMA trunk with pseudoaneurysm formation and overt contrast extravasation into the duodenal lumen. The SMA trunk was embolized with coils, preserve ring proximal branch patency.  There is collateral reconstitution of multiple distal branches with SMA injection. The IMA is also presumably supplying additional collateral vessels to bowel.   Electronically Signed   By: Aletta Edouard M.D.   On: 11/30/2014 16:13   [place] Guide Roadmapping  11/30/2014   CLINICAL DATA:  Progression of metastatic pancreatic carcinoma with invasion of the duodenum and active bleeding requiring multiple units of blood transfusion overnight. Endoscopy on 11/28/2014 demonstrated friable tumor extending into the lumen of the duodenum. CT has demonstrated invasion and encasement of  the SMA trunk by the large duodenal tumor.  EXAM: 1. ULTRASOUND GUIDANCE FOR VASCULAR ACCESS OF THE RIGHT COMMON FEMORAL ARTERY 2. SELECTIVE ARTERIOGRAPHY OF THE CELIAC AXIS 3. SELECTIVE ARTERIOGRAPHY OF THE COMMON HEPATIC ARTERY 4. SELECTIVE ARTERIOGRAPHY OF THE GASTRODUODENAL ARTERY 5. SELECTIVE ARTERIOGRAPHY OF A PANCREATICODUODENAL TRUNK OFF OF THE GASTRODUODENAL ARTERY 6. TRANSCATHETER COIL EMBOLIZATION OF PANCREATICODUODENAL TRUNK 7. TRANSCATHETER COIL EMBOLIZATION OF GASTRODUODENAL ARTERY 8. SELECTIVE ARTERIOGRAPHY OF THE SUPERIOR MESENTERIC ARTERY 9. ADDITIONAL SECOND ORDER ARTERIOGRAPHY TIMES TWO OF SUPERIOR MESENTERIC ARTERY BRANCHES 10. TRANSCATHETER COIL EMBOLIZATION OF SUPERIOR MESENTERIC ARTERY  ANESTHESIA/SEDATION: Sedation:  2.5 mg IV Versed sec, 150 mcg IV fentanyl  Total Moderate Sedation Time:  84 minutes.  MEDICATIONS: 1.5 mg IV Dilaudid  CONTRAST:  147mL OMNIPAQUE IOHEXOL 300 MG/ML  SOLN  FLUOROSCOPY TIME:  24 min and 24 seconds.  PROCEDURE: Prior to the procedure, informed consent was obtained from the patient for visceral arteriography with possible transcatheter embolization to treat active upper GI bleeding. Maximal barrier sterile technique was utilized including caps, mask, sterile gowns, sterile gloves, sterile drape, hand hygiene and skin antiseptic.  The right groin was prepped with Betadine. Local anesthesia was provided with 1% lidocaine. A time-out was performed prior to the procedure.  Ultrasound was used to confirm patency of the right common femoral artery. Under direct ultrasound guidance, the artery was accessed with a micropuncture set. A 5 French sheath was placed over a guidewire. A 5 [place] cobra catheter was advanced into the abdominal aorta. This is used to selectively catheterize the celiac axis. Selective arteriography was through the 5 French catheter.  The 5 French catheter was further advanced into the common hepatic artery. Selective arteriography was performed. A  micro catheter was then advanced through the 5 French catheter into a pancreaticoduodenal artery emanating off of the proximal gastroduodenal artery. Selective arteriography was performed. Transcatheter embolization of the pancreaticoduodenal branch was then performed utilizing 2 mm and 3 mm diameter Interlock coils. Arteriography was performed through the micro catheter following embolization.  The micro catheter was then redirected into the main trunk of the gastroduodenal artery. Selective arteriography was performed. The gastroduodenal artery was then embolized utilizing interlock coils bearing in diameter from 3 mm up to 5 mm. Arteriography was performed through the micro catheter following embolization.  The micro catheter was removed. The superior mesenteric artery was catheterized with the 5 French catheter. Selective arteriography was performed. A micro catheter was advanced into a second order SMA branch. Selective arteriography was performed. A micro catheter was then further advanced in the superior mesenteric artery and additional selective arteriography performed. Transcatheter coil embolization was then performed of the superior mesenteric artery with coils ranging in diameter from 5 mm down to 3 mm. Additional arteriography was then performed through the 5 French catheter positioned in the proximal superior mesenteric artery.  Oblique arteriography was performed at the level of right groin access.  COMPLICATIONS:  None immediate.  FINDINGS: Celiac arteriography and common hepatic arteriography shows a patent gastroduodenal artery. There is a prominent lateral pancreaticoduodenal branch off of the proximal GDA. Selective arteriography at the level of the pancreaticoduodenal branch demonstrated a focal pseudoaneurysm at the level of the pancreatic head or proximal duodenum. Tumor blush was also identified. This branch was successfully occluded with coils.  The gastroduodenal artery trunk was also  embolized successfully with coils resulting in complete angiographic occlusion.  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 Once a micro catheter was advanced into the region of extravasation, contrast injection shows opacification of the duodenum lumen. The bleeding segment of the SMA trunk, including the pseudoaneurysm was able to be successfully occluded by embolization coils. Branch vessel patency was preserved and there is collateral reconstitution of distal branches beyond the SMA trunk. The inferior mesenteric artery is presumably also supplying collateral supply. The IMA was not injected.  IMPRESSION: 1. 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 2. Tumor erosion of the SMA trunk with pseudoaneurysm formation and overt contrast extravasation into the duodenal lumen. The SMA trunk was embolized with coils, preserve ring proximal branch patency. There is collateral reconstitution of multiple distal branches with SMA injection. The IMA is also presumably supplying additional collateral vessels to bowel.   Electronically Signed   By: Aletta Edouard M.D.   On: 11/30/2014 16:13    Scheduled Meds: . sodium chloride   Intravenous Once  . ALPRAZolam  0.5 mg Oral Once  . antiseptic oral rinse  7 mL Mouth Rinse q12n4p  . chlorhexidine  15 mL Mouth Rinse BID  . morphine  60 mg Oral Q12H  . sodium chloride  3 mL Intravenous Q12H   Continuous Infusions: . sodium chloride 125 mL/hr at 12/01/14 0807  . sodium chloride      Active Problems:   GIB (gastrointestinal bleeding)   Protein-calorie malnutrition, severe   Severe malnutrition    Time spent: 25  min    [facility]  [facility] Pager [phone]. If 7PM-7AM, please contact night-coverage at [email or web address], password [facility] 12/01/2014, 1:44 PM  LOS: 3 days

## 2014-12-01 NOTE — Progress Notes (Signed)
Richard Cantu 11:56 AM  Subjective: Patient is doing better without obvious bleeding and just having old blood and wants to eat and is tolerating clear liquids and did well with his angiogram and his case was discussed with multiple family members and his endoscopic pictures were reviewed and his case discussed with my partner Dr. Penelope Coop  Objective: Vital signs stable afebrile no acute distress abdomen soft nontender hemoglobin increased BUN okay  Assessment: Pancreatic cancer with erosion into duodenum and bleeding  Plan: Agree with radiation however if signs of bleeding continue might try one more endoscopy using our APC laser although based on the size and diffuse area of oozing I think this would be low yield but I would be happy to try and we discussed diet in the future and will advance tomorrow if no signs of bleeding and hopefully he'll be able to go home soon and proceed with outpatient radiology  [facility]

## 2014-12-02 LAB — TYPE AND SCREEN
ABO/RH(D): A POS
ANTIBODY SCREEN: NEGATIVE
UNIT DIVISION: 0
UNIT DIVISION: 0
Unit division: 0
Unit division: 0
Unit division: 0
Unit division: 0
Unit division: 0
Unit division: 0
Unit division: 0

## 2014-12-02 LAB — CBC
HCT: 27.6 % — ABNORMAL LOW (ref 39.0–52.0)
Hemoglobin: 9.3 g/dL — ABNORMAL LOW (ref 13.0–17.0)
MCH: 29.2 pg (ref 26.0–34.0)
MCHC: 33.7 g/dL (ref 30.0–36.0)
MCV: 86.5 fL (ref 78.0–100.0)
PLATELETS: 235 10*3/uL (ref 150–400)
RBC: 3.19 MIL/uL — AB (ref 4.22–5.81)
RDW: 15.8 % — AB (ref 11.5–15.5)
WBC: 13 10*3/uL — ABNORMAL HIGH (ref 4.0–10.5)

## 2014-12-02 LAB — COMPREHENSIVE METABOLIC PANEL
ALBUMIN: 2.1 g/dL — AB (ref 3.5–5.2)
ALT: 15 U/L (ref 0–53)
AST: 19 U/L (ref 0–37)
Alkaline Phosphatase: 43 U/L (ref 39–117)
Anion gap: 1 — ABNORMAL LOW (ref 5–15)
BUN: <5 mg/dL — ABNORMAL LOW (ref 6–23)
CHLORIDE: 106 meq/L (ref 96–112)
CO2: 25 mmol/L (ref 19–32)
Calcium: 7 mg/dL — ABNORMAL LOW (ref 8.4–10.5)
Creatinine, Ser: 0.32 mg/dL — ABNORMAL LOW (ref 0.50–1.35)
GFR calc Af Amer: >90 mL/min (ref >90)
GFR calc non Af Amer: >90 mL/min (ref >90)
GLUCOSE: 86 mg/dL (ref 70–99)
Potassium: 3.1 mmol/L — ABNORMAL LOW (ref 3.5–5.1)
SODIUM: 132 mmol/L — AB (ref 135–145)
Total Bilirubin: 0.7 mg/dL (ref 0.3–1.2)
Total Protein: 4.2 g/dL — ABNORMAL LOW (ref 6.0–8.3)

## 2014-12-02 LAB — HEMOGLOBIN AND HEMATOCRIT, BLOOD
HEMATOCRIT: 26.1 % — AB (ref 39.0–52.0)
Hemoglobin: 8.9 g/dL — ABNORMAL LOW (ref 13.0–17.0)

## 2014-12-02 MED ORDER — ALPRAZOLAM 1 MG PO TABS
1.0000 mg | ORAL_TABLET | Freq: Two times a day (BID) | ORAL | Status: DC | PRN
  Administered 2014-12-02 – 2014-12-05 (×4): 1 mg via ORAL
  Filled 2014-12-02 (×4): qty 1

## 2014-12-02 MED ORDER — POTASSIUM CHLORIDE CRYS ER 20 MEQ PO TBCR
40.0000 meq | EXTENDED_RELEASE_TABLET | Freq: Two times a day (BID) | ORAL | Status: AC
  Administered 2014-12-02 (×2): 40 meq via ORAL
  Filled 2014-12-02 (×2): qty 2

## 2014-12-02 MED ORDER — PANTOPRAZOLE SODIUM 40 MG PO TBEC
40.0000 mg | DELAYED_RELEASE_TABLET | Freq: Every day | ORAL | Status: DC
  Administered 2014-12-02 – 2014-12-05 (×4): 40 mg via ORAL
  Filled 2014-12-02 (×4): qty 1

## 2014-12-02 NOTE — Progress Notes (Signed)
Richard Cantu 11:03 AM  Subjective: Patient doing better with minimal abdominal pain after eating and only passing a little dark bowels and wants to eat and generally just weak  Objective: Vital signs stable afebrile no acute distress abdomen is soft nontender hemoglobin stable  Assessment: Pancreatic cancer  Plan: Will add pump inhibitor once a day he has been on Nexium without a problem in the past and consider adding Carafate in the future if signs of continual oozing otherwise he can probably have soft solids and slowly advance activity and hopefully be able to go home soon and see yesterday's notes for other recommendations and please call us if we could be of any further assistance during this hospital stay  [facility]

## 2014-12-02 NOTE — Progress Notes (Signed)
[facility] PROGRESS NOTE  Richard Cantu IWL:[id number] DOB: [birth year] DOA: 11/28/2014 PCP: Ned Card, NP Interim summary: 59 year old male with h/o pancreatic cancer, comes in for GI bleed and hypotension. He received 3 units of prbc transfusion and underwent EGD on 11/28/14. He was found to have a friable mass, probably from invasion of the pancreatic cancer into the duodenum.  Assessment/Plan: 1. GI bleed: Secondary to bleeding from the friable mass in the duodenum, invasion of the pancreatic cancer into the duodenum.   Further drop in bp and hemoglobin on 1/8. Another 3 units of prbc transfusion ordered. He received a total of 8 unitsof prbc this admission. Transfuse to keep hemoglobin greater than 7.  IR consulted for S/p emergent visceral arteriogram with multi-vessel embolization on 1/8. His H&h remains stable. Advanced diet.    2. Hypotension: improved with prbc and fluid boluses  3. Pancreatic cancer: Dr Learta Codding on board. CT abdomen and pelvis showed progression of the cancer with necrotic pancreatic mass likely invaded into the third portion of the duodenum.    4. Anemia of blood loss: H&h stable today.  Received a total of 8  units of prbc transfusion. Monitor H&h every 6 hours.   5. Hypokalemia: replete as needed.   Code Status: full code Family Communication: discussed with family at bedside Disposition Plan: pending.    Consultants:  Gastroenterology  Oncology  IR  Procedures:  EGD  Embolization procedure by IR  CT ABD and pelvis.   Antibiotics:  NONE  HPI/Subjective: Painbetter controlled with dilaudid . One BM today.   Objective: Filed Vitals:   12/02/14 1600  BP: 144/87  Pulse: 102  Temp: 98.4 F (36.9 C)  Resp: 22    Intake/Output Summary (Last 24 hours) at 12/02/14 1746 Last data filed at 12/02/14 1600  Gross per 24 hour  Intake 2612.5 ml  Output   1750 ml  Net  862.5 ml   Filed Weights   11/28/14 0021 12/02/14 0400   Weight: 73.936 kg (163 lb) 82.9 kg (182 lb 12.2 oz)    Exam:   General:  Alert  Appears exhausted.   Cardiovascular: s1s2  Respiratory: ctab  Abdomen: soft mildly tender diffuse  Musculoskeletal: no pedal edema.   Data Reviewed: Basic Metabolic Panel:  Recent Labs Lab 11/28/14 0825 11/29/14 0430 11/30/14 0400 12/01/14 0400 12/02/14 0450  NA 138 138 137 140 132*  K 3.7 3.4* 3.6 3.5 3.1*  CL 109 107 108 110 106  CO2 24 24 22 24 25   GLUCOSE 111* 100* 137* 128* 86  BUN 16 5* 12 12 <5*  CREATININE 0.45* 0.41* 0.49* 0.44* 0.32*  CALCIUM 7.2* 7.7* 7.0* 7.1* 7.0*   Liver Function Tests:  Recent Labs Lab 11/28/14 0825 11/29/14 0430 11/30/14 0400 12/01/14 0400 12/02/14 0450  AST 15 17 20 21 19   ALT 15 16 15 18 15   ALKPHOS 44 51 39 37* 43  BILITOT 0.5 0.6 0.7 0.6 0.7  PROT 4.4* 5.0* 3.8* 3.8* 4.2*  ALBUMIN 2.2* 2.5* 1.9* 1.9* 2.1*    Recent Labs Lab 11/28/14 0043  LIPASE 660*   No results for input(s): AMMONIA in the last 168 hours. CBC:  Recent Labs Lab 11/28/14 0825 11/28/14 1510 11/29/14 0430 11/29/14 1430 11/29/14 1845 11/30/14 0549  12/01/14 0946 12/01/14 1840 12/01/14 2109 12/02/14 0450 12/02/14 1658  WBC 5.3 5.9 6.2 5.9 8.5 7.1  --   --   --   --   --  13.0*  NEUTROABS 3.4  4.0 4.5 4.0  --  5.3  --   --   --   --   --   --   HGB 7.2* 8.4* 8.8* 7.9* 6.7* 5.4*  < > 6.0* 9.2* 9.4* 8.9* 9.3*  HCT 21.3* 25.0* 26.0* 23.5* 19.4* 15.9*  < > 17.6* 26.9* 28.0* 26.1* 27.6*  MCV 86.9 87.1 87.0 87.7 86.6 87.8  --   --   --   --   --  86.5  PLT 200 222 238 243 279 222  --   --   --   --   --  235  < > = values in this interval not displayed. Cardiac Enzymes:  Recent Labs Lab 11/28/14 0043  TROPONINI <0.03   BNP (last 3 results) No results for input(s): PROBNP in the last 8760 hours. CBG: No results for input(s): GLUCAP in the last 168 hours.  Recent Results (from the past 240 hour(s))  MRSA PCR Screening     Status: None   Collection Time:  11/28/14  6:42 AM  Result Value Ref Range Status   MRSA by PCR NEGATIVE NEGATIVE Final    Comment:        The GeneXpert MRSA Assay (FDA approved for NASAL specimens only), is one component of a comprehensive MRSA colonization surveillance program. It is not intended to diagnose MRSA infection nor to guide or monitor treatment for MRSA infections.      Studies: No results found.  Scheduled Meds: . sodium chloride   Intravenous Once  . ALPRAZolam  0.5 mg Oral Once  . antiseptic oral rinse  7 mL Mouth Rinse q12n4p  . chlorhexidine  15 mL Mouth Rinse BID  . morphine  60 mg Oral Q12H  . pantoprazole  40 mg Oral Daily  . potassium chloride  40 mEq Oral BID  . sodium chloride  3 mL Intravenous Q12H   Continuous Infusions: . sodium chloride 75 mL/hr at 12/02/14 1333  . sodium chloride Stopped (12/02/14 1153)    Active Problems:   GIB (gastrointestinal bleeding)   Protein-calorie malnutrition, severe   Severe malnutrition    Time spent: 25 min    [facility] Pager [phone]. If 7PM-7AM, please contact night-coverage at [email or web address], password [facility] 12/02/2014, 5:46 PM  LOS: 4 days

## 2014-12-03 ENCOUNTER — Telehealth: Payer: Self-pay | Admitting: *Deleted

## 2014-12-03 ENCOUNTER — Ambulatory Visit
Admit: 2014-12-03 | Discharge: 2014-12-03 | Disposition: A | Discharge status: Home / Self Care | Payer: BLUE CROSS/BLUE SHIELD | Attending: Radiation Oncology | Admitting: Radiation Oncology

## 2014-12-03 DIAGNOSIS — R109 Unspecified abdominal pain: Secondary | ICD-10-CM

## 2014-12-03 DIAGNOSIS — C250 Malignant neoplasm of head of pancreas: Secondary | ICD-10-CM

## 2014-12-03 DIAGNOSIS — C61 Malignant neoplasm of prostate: Secondary | ICD-10-CM | POA: Insufficient documentation

## 2014-12-03 DIAGNOSIS — R358 Other polyuria: Secondary | ICD-10-CM

## 2014-12-03 DIAGNOSIS — Z510 Encounter for antineoplastic radiation therapy: Secondary | ICD-10-CM | POA: Insufficient documentation

## 2014-12-03 DIAGNOSIS — M25511 Pain in right shoulder: Secondary | ICD-10-CM

## 2014-12-03 DIAGNOSIS — R58 Hemorrhage, not elsewhere classified: Secondary | ICD-10-CM | POA: Insufficient documentation

## 2014-12-03 LAB — BASIC METABOLIC PANEL
Anion gap: 9 (ref 5–15)
CO2: 26 mmol/L (ref 19–32)
CREATININE: 0.42 mg/dL — AB (ref 0.50–1.35)
Calcium: 7.6 mg/dL — ABNORMAL LOW (ref 8.4–10.5)
Chloride: 102 mEq/L (ref 96–112)
GFR calc Af Amer: >90 mL/min (ref >90)
Glucose, Bld: 99 mg/dL (ref 70–99)
POTASSIUM: 3.3 mmol/L — AB (ref 3.5–5.1)
Sodium: 137 mmol/L (ref 135–145)

## 2014-12-03 LAB — CBC
HCT: 26.1 % — ABNORMAL LOW (ref 39.0–52.0)
HEMOGLOBIN: 8.9 g/dL — AB (ref 13.0–17.0)
MCH: 30 pg (ref 26.0–34.0)
MCHC: 34.1 g/dL (ref 30.0–36.0)
MCV: 87.9 fL (ref 78.0–100.0)
PLATELETS: 234 10*3/uL (ref 150–400)
RBC: 2.97 MIL/uL — ABNORMAL LOW (ref 4.22–5.81)
RDW: 16.2 % — ABNORMAL HIGH (ref 11.5–15.5)
WBC: 12.3 10*3/uL — ABNORMAL HIGH (ref 4.0–10.5)

## 2014-12-03 LAB — POTASSIUM: Potassium: 3.2 mmol/L — ABNORMAL LOW (ref 3.5–5.1)

## 2014-12-03 LAB — MAGNESIUM: Magnesium: 1.7 mg/dL (ref 1.5–2.5)

## 2014-12-03 LAB — HEMOGLOBIN AND HEMATOCRIT, BLOOD
HCT: 26.2 % — ABNORMAL LOW (ref 39.0–52.0)
HCT: 26.5 % — ABNORMAL LOW (ref 39.0–52.0)
Hemoglobin: 8.9 g/dL — ABNORMAL LOW (ref 13.0–17.0)
Hemoglobin: 8.9 g/dL — ABNORMAL LOW (ref 13.0–17.0)

## 2014-12-03 MED ORDER — SODIUM CHLORIDE 0.9 % IJ SOLN
10.0000 mL | INTRAMUSCULAR | Status: DC | PRN
  Administered 2014-12-05 (×2): 10 mL
  Filled 2014-12-03 (×2): qty 40

## 2014-12-03 MED ORDER — MAGNESIUM SULFATE IN D5W 10-5 MG/ML-% IV SOLN
1.0000 g | Freq: Once | INTRAVENOUS | Status: AC
  Administered 2014-12-03: 1 g via INTRAVENOUS
  Filled 2014-12-03: qty 100

## 2014-12-03 MED ORDER — POTASSIUM CHLORIDE CRYS ER 20 MEQ PO TBCR
40.0000 meq | EXTENDED_RELEASE_TABLET | Freq: Once | ORAL | Status: DC
  Filled 2014-12-03: qty 2

## 2014-12-03 MED ORDER — MORPHINE SULFATE ER 100 MG PO TBCR
100.0000 mg | EXTENDED_RELEASE_TABLET | Freq: Two times a day (BID) | ORAL | Status: DC
  Administered 2014-12-03 – 2014-12-05 (×4): 100 mg via ORAL
  Filled 2014-12-03 (×4): qty 1

## 2014-12-03 MED ORDER — BOOST PLUS PO LIQD
237.0000 mL | Freq: Three times a day (TID) | ORAL | Status: DC
  Administered 2014-12-04 – 2014-12-05 (×4): 237 mL via ORAL
  Filled 2014-12-03 (×6): qty 237

## 2014-12-03 MED ORDER — SODIUM CHLORIDE 0.9 % IJ SOLN
10.0000 mL | Freq: Two times a day (BID) | INTRAMUSCULAR | Status: DC
  Administered 2014-12-04: 10 mL

## 2014-12-03 MED ORDER — CAPECITABINE 500 MG PO TABS
1500.0000 mg | ORAL_TABLET | Freq: Two times a day (BID) | ORAL | Status: DC
  Filled 2014-12-03 (×5): qty 3

## 2014-12-03 MED ORDER — HYDROMORPHONE HCL 4 MG PO TABS
8.0000 mg | ORAL_TABLET | ORAL | Status: DC | PRN
  Administered 2014-12-03 – 2014-12-05 (×8): 12 mg via ORAL
  Filled 2014-12-03 (×4): qty 3
  Filled 2014-12-03: qty 2
  Filled 2014-12-03 (×4): qty 3

## 2014-12-03 MED ORDER — TAMSULOSIN HCL 0.4 MG PO CAPS
0.4000 mg | ORAL_CAPSULE | Freq: Every day | ORAL | Status: DC
  Administered 2014-12-03 – 2014-12-05 (×3): 0.4 mg via ORAL
  Filled 2014-12-03 (×3): qty 1

## 2014-12-03 NOTE — Progress Notes (Signed)
[facility] PROGRESS NOTE  Richard Cantu GYJ:[id number] DOB: 1956-08-27 DOA: 11/28/2014 PCP: Ned Card, NP Interim summary: 59 year old male with h/o pancreatic cancer, comes in for GI bleed and hypotension. CT ABD and pelvis revealed invasion of the pancreatic cancer int o the duodenum. He underwent emergent arteriogram with multi vessel embolization by IR and his bleeding appears to have stopped. Radiation oncology consulted for palliative radiation treatments .   Assessment/Plan: 1. GI bleed: Secondary to bleeding from the friable mass in the duodenum, invasion of the pancreatic cancer into the duodenum.   Further drop in bp and hemoglobin on 1/8. Another 3 units of prbc transfusion ordered. He received a total of 8 unitsof prbc this admission. Transfuse to keep hemoglobin greater than 7.  IR consulted for S/p emergent visceral arteriogram with multi-vessel embolization on 1/8. His H&h remains stable. Advanced diet.    2. Hypotension: improved with prbc and fluid boluses  3. Pancreatic cancer: Dr Learta Codding on board. CT abdomen and pelvis showed progression of the cancer with necrotic pancreatic mass likely invaded into the third portion of the duodenum.    4. Anemia of blood loss: H&h stable today.  Received a total of 8  units of prbc transfusion. Monitor H&h every 6 hours.   5. Hypokalemia: replete as needed.   Code Status: full code Family Communication: discussed with family at bedside Disposition Plan: transferred to telemetry.   Consultants:  Gastroenterology  Oncology  IR  Radiation oncology.   Procedures:  EGD  Embolization procedure by IR  CT ABD and pelvis.   Antibiotics:  NONE  HPI/Subjective: Painbetter controlled with dilaudid . Increased xanax.   Objective: Filed Vitals:   12/03/14 1100  BP: 125/78  Pulse: 100  Temp: 98.2 F (36.8 C)  Resp: 20    Intake/Output Summary (Last 24 hours) at 12/03/14 1238 Last data filed at 12/03/14  1100  Gross per 24 hour  Intake 1927.08 ml  Output   3200 ml  Net -1272.92 ml   Filed Weights   11/28/14 0021 12/02/14 0400  Weight: 73.936 kg (163 lb) 82.9 kg (182 lb 12.2 oz)    Exam:   General:  Alert  Appears exhausted.   Cardiovascular: s1s2  Respiratory: ctab  Abdomen: soft mildly tender diffuse  Musculoskeletal: no pedal edema.   Data Reviewed: Basic Metabolic Panel:  Recent Labs Lab 11/29/14 0430 11/30/14 0400 12/01/14 0400 12/02/14 0450 12/03/14 0243  NA 138 137 140 132* 137  K 3.4* 3.6 3.5 3.1* 3.3*  3.2*  CL 107 108 110 106 102  CO2 24 22 24 25 26   GLUCOSE 100* 137* 128* 86 99  BUN 5* 12 12 <5* <5*  CREATININE 0.41* 0.49* 0.44* 0.32* 0.42*  CALCIUM 7.7* 7.0* 7.1* 7.0* 7.6*  MG  --   --   --   --  1.7   Liver Function Tests:  Recent Labs Lab 11/28/14 0825 11/29/14 0430 11/30/14 0400 12/01/14 0400 12/02/14 0450  AST 15 17 20 21 19   ALT 15 16 15 18 15   ALKPHOS 44 51 39 37* 43  BILITOT 0.5 0.6 0.7 0.6 0.7  PROT 4.4* 5.0* 3.8* 3.8* 4.2*  ALBUMIN 2.2* 2.5* 1.9* 1.9* 2.1*    Recent Labs Lab 11/28/14 0043  LIPASE 660*   No results for input(s): AMMONIA in the last 168 hours. CBC:  Recent Labs Lab 11/28/14 0825 11/28/14 1510 11/29/14 0430 11/29/14 1430 11/29/14 1845 11/30/14 0549  12/01/14 1840 12/01/14 2109 12/02/14 0450 12/02/14  1658 12/03/14 0243  WBC 5.3 5.9 6.2 5.9 8.5 7.1  --   --   --   --  13.0* 12.3*  NEUTROABS 3.4 4.0 4.5 4.0  --  5.3  --   --   --   --   --   --   HGB 7.2* 8.4* 8.8* 7.9* 6.7* 5.4*  < > 9.2* 9.4* 8.9* 9.3* 8.9*  8.9*  HCT 21.3* 25.0* 26.0* 23.5* 19.4* 15.9*  < > 26.9* 28.0* 26.1* 27.6* 26.1*  26.2*  MCV 86.9 87.1 87.0 87.7 86.6 87.8  --   --   --   --  86.5 87.9  PLT 200 222 238 243 279 222  --   --   --   --  235 234  < > = values in this interval not displayed. Cardiac Enzymes:  Recent Labs Lab 11/28/14 0043  TROPONINI <0.03   BNP (last 3 results) No results for input(s): PROBNP in the  last 8760 hours. CBG: No results for input(s): GLUCAP in the last 168 hours.  Recent Results (from the past 240 hour(s))  MRSA PCR Screening     Status: None   Collection Time: 11/28/14  6:42 AM  Result Value Ref Range Status   MRSA by PCR NEGATIVE NEGATIVE Final    Comment:        The GeneXpert MRSA Assay (FDA approved for NASAL specimens only), is one component of a comprehensive MRSA colonization surveillance program. It is not intended to diagnose MRSA infection nor to guide or monitor treatment for MRSA infections.      Studies: No results found.  Scheduled Meds: . sodium chloride   Intravenous Once  . ALPRAZolam  0.5 mg Oral Once  . antiseptic oral rinse  7 mL Mouth Rinse q12n4p  . [START ON 12/04/2014] capecitabine  1,500 mg Oral BID PC  . chlorhexidine  15 mL Mouth Rinse BID  . magnesium sulfate 1 - 4 g bolus IVPB  1 g Intravenous Once  . morphine  100 mg Oral Q12H  . pantoprazole  40 mg Oral Daily  . potassium chloride  40 mEq Oral Once  . sodium chloride  3 mL Intravenous Q12H  . tamsulosin  0.4 mg Oral Daily   Continuous Infusions: . sodium chloride 10 mL/hr at 12/03/14 1013    Active Problems:   GIB (gastrointestinal bleeding)   Protein-calorie malnutrition, severe   Severe malnutrition    Time spent: 25 min    [facility] Pager [phone]. If 7PM-7AM, please contact night-coverage at [email or web address], password [facility] 12/03/2014, 12:38 PM  LOS: 5 days

## 2014-12-03 NOTE — Progress Notes (Signed)
Pt has had multiple ectopy on ecg, nonsustained. [facility]  called and made aware.  Pt denies any discomfort.  New orders received and initiated.  Will continue to monitor and report.

## 2014-12-03 NOTE — Progress Notes (Signed)
  Radiation Oncology         [phone] ________________________________  Name: Richard Cantu MRN: [id number]  Date: 12/03/2014  DOB: 04-03-1956  SIMULATION AND TREATMENT PLANNING NOTE  DIAGNOSIS:  Pancreatic cancer  Site:  Abdomen/ pancreas  NARRATIVE:  The patient was brought to the [place] suite.  Identity was confirmed.  All relevant records and images related to the planned course of therapy were reviewed.   Written consent to proceed with treatment was confirmed which was freely given after reviewing the details related to the planned course of therapy had been reviewed with the patient.  Then, the patient was set-up in a stable reproducible  supine position for radiation therapy.  CT images were obtained.  Surface markings were placed.    Medically necessary complex treatment device(s) for immobilization:  Vac-lock bag.   The CT images were loaded into the planning software.  Then the target and avoidance structures were contoured.  Treatment planning then occurred.  The radiation prescription was entered and confirmed.  A total of 4 complex treatment devices were fabricated which relate to the designed radiation treatment fields. Each of these customized fields/ complex treatment devices will be used on a daily basis during the radiation course. I have requested : 3D Simulation  I have requested a DVH of the following structures: target volume, left kidney, right kidney, liver, cord.   PLAN:  The patient will receive 37.5 Gy in 15 fractions.  Special treatment procedure The patient will also receive concurrent chemotherapy during the treatment. The patient may therefore experience increased toxicity or side effects and the patient will be monitored for such problems. This may require extra lab work as necessary. This therefore constitutes a special treatment procedure.   ________________________________   Jodelle Gross, MD, PhD

## 2014-12-03 NOTE — Telephone Encounter (Signed)
Called ICU [phone] spoke with RN Edd Arbour, patient was just medicated for ppain, asked if he could come for CT Simulation on his abdomen, we would have him here for that for aprox 1 hour , this is for radiation to start treatment, per [facility], that would be fine, he is on telemetry ,notifed Vincent therapist in [place] 9:29 AM

## 2014-12-03 NOTE — Progress Notes (Signed)
INITIAL NUTRITION ASSESSMENT  DOCUMENTATION CODES Per approved criteria  -Severe malnutrition in the context of chronic illness  Pt meets criteria for severe MALNUTRITION in the context of chronic ilness as evidenced by PO intake < 75% for > one month, 16% body weight loss in 3 months.  INTERVENTION: -Boost Plus TID -RD to continue to monitor  NUTRITION DIAGNOSIS: Inadequate oral intake related to taste changes/decreased appetite as evidenced by PO intake < 75%, 30 lb wt loss in 3 months; ongoing  Goal: Pt to meet >/= 90% of their estimated nutrition needs; improving  Monitor:  Diet order, total protein/energy intake, labs, weights, education needs  Reason for Assessment: f/u  59 y.o. male  Admitting Dx: <principal problem not specified>  ASSESSMENT: This is a 59 y.o. year old male with significant past medical history of pancreatic cancer on chemotherapy, HTN, GERD presenting with GIB, hypotension, acute blood loss anemia. Pt reports black stools over past 1-2 days. Has received 4 rounds of chemotherapy for pancreatic cancer since 07/2014 w/ improving markers and pain. Is due for next round of chemo 11/28/2014  1/6: -Pt in EGD during time of RD assessment; obtained information from pt's family in room -Family reported pt with ongoing varying appetite since start of chemo treatments. -Diet recall indicates pt consuming Boost High Protein supplements 3-4 times daily. Will snack on fruits in-between supplements. Family has been encouraging pt to eat when pt visits; however is unsure how well pt eats at home when he is alone -Endorsed ongoing weight loss. Usual body weight 190-200 lb; indicating a 16% body weight loss in 3 months (severe for time frame) -Was seen by outpatient oncology RD in 10/2014; was educated on taste changes, and high protein/kcal nutrition -Will follow up with education needs as diet advancement tolerated; family noted pt may be agreeable to cheese and cracker  snack along with Boost supplements TID  1/11: - Pt with meal completion of 30%. Pt reports that he ate a good lunch today that consisted of roast beef, green beans and a baked potato. He did not eat breakfast this morning due to switching rooms and testing. He currently has an improvement in appetite. Pt agreeable to start nutritional supplements.   Labs: Na WNL K low BUN low  Height: Ht Readings from Last 1 Encounters:  11/28/14 5\' 9"  (1.753 m)    Weight: Wt Readings from Last 1 Encounters:  12/02/14 182 lb 12.2 oz (82.9 kg)    Wt Readings from Last 10 Encounters:  12/02/14 182 lb 12.2 oz (82.9 kg)  11/27/14 163 lb 9.6 oz (74.208 kg)  11/13/14 165 lb 14.4 oz (75.252 kg)  10/31/14 163 lb 9.6 oz (74.208 kg)  10/24/14 161 lb 6.4 oz (73.211 kg)  10/10/14 169 lb 8 oz (76.885 kg)  09/25/14 178 lb (80.74 kg)  09/21/14 179 lb (81.194 kg)  09/17/14 178 lb 3.2 oz (80.831 kg)  08/30/14 184 lb 8 oz (83.689 kg)    BMI:  Body mass index is 26.98 kg/(m^2).  Estimated Nutritional Needs: Kcal: 1610-9604 Protein: 115-130 gram Fluid: >/=2300 ml daily  Skin: WDL  Diet Order: DIET SOFT  EDUCATION NEEDS: -Education needs addressed   Intake/Output Summary (Last 24 hours) at 12/03/14 1722 Last data filed at 12/03/14 1444  Gross per 24 hour  Intake 1792.08 ml  Output   3400 ml  Net -1607.92 ml    Last BM: 1/11   Labs:   Recent Labs Lab 12/01/14 0400 12/02/14 0450 12/03/14 0243  NA  140 132* 137  K 3.5 3.1* 3.3*  3.2*  CL 110 106 102  CO2 24 25 26   BUN 12 <5* <5*  CREATININE 0.44* 0.32* 0.42*  CALCIUM 7.1* 7.0* 7.6*  MG  --   --  1.7  GLUCOSE 128* 86 99    CBG (last 3)  No results for input(s): GLUCAP in the last 72 hours.  Scheduled Meds: . sodium chloride   Intravenous Once  . ALPRAZolam  0.5 mg Oral Once  . antiseptic oral rinse  7 mL Mouth Rinse q12n4p  . [START ON 12/04/2014] capecitabine  1,500 mg Oral BID PC  . chlorhexidine  15 mL Mouth Rinse BID  .  morphine  100 mg Oral Q12H  . pantoprazole  40 mg Oral Daily  . potassium chloride  40 mEq Oral Once  . sodium chloride  10-40 mL Intracatheter Q12H  . sodium chloride  3 mL Intravenous Q12H  . tamsulosin  0.4 mg Oral Daily    Continuous Infusions: . sodium chloride 10 mL/hr at 12/03/14 1013    Past Medical History  Diagnosis Date  . Hemorrhoids   . Chronic pancreatitis   . Pancreatic cyst dx'd 08/03/2014  . History of gout   . Anxiety   . 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 HTN (hypertension)   . Wears glasses   . GERD (gastroesophageal reflux disease)     Past Surgical History  Procedure Laterality Date  . Colonoscopy    . Eus N/A 08/03/2014    Procedure: ESOPHAGEAL ENDOSCOPIC ULTRASOUND (EUS) RADIAL;  Surgeon: Arta Silence, MD;  Location: [facility];  Service: Endoscopy;  Laterality: N/A;  H&P in file  . Eus N/A 09/04/2014    Procedure: ESOPHAGEAL ENDOSCOPIC ULTRASOUND (EUS) RADIAL;  Surgeon: Arta Silence, MD;  Location: [facility];  Service: Endoscopy;  Laterality: N/A;  . Fine needle aspiration N/A 09/04/2014    Procedure: FINE NEEDLE ASPIRATION (FNA) RADIAL;  Surgeon: Arta Silence, MD;  Location: [facility];  Service: Endoscopy;  Laterality: N/A;  . Portacath placement Left 09/25/2014    Procedure: INSERTION PORT-A-CATH;  Surgeon: Stark Klein, MD;  Location: [facility];  Service: General;  Laterality: Left;  . Esophagogastroduodenoscopy N/A 11/28/2014    Procedure: ESOPHAGOGASTRODUODENOSCOPY (EGD);  Surgeon: Wonda Horner, MD;  Location: [facility];  Service: Endoscopy;  Laterality: N/A;    Laurette Schimke MS, RD, LDN

## 2014-12-03 NOTE — Evaluation (Signed)
Physical Therapy One Time Evaluation Patient Details Name: CHRISTAPHER GILLIAN MRN: [id number] DOB: 02-Dec-1955 Today's Date: 12/03/2014   History of Present Illness  59 year old male with h/o pancreatic cancer, comes in for GI bleed and hypotension. He received 3 units of prbc transfusion and underwent EGD on 11/28/14. He was found to have a friable mass, probably from invasion of the pancreatic cancer into the duodenum.. PT s/p emergent visceral arteriogram with multi-vessel embolization on 1/8  Clinical Impression  Patient evaluated by Physical Therapy with no further acute PT needs identified. All education has been completed and the patient has no further questions.  Pt ambulated around unit twice without difficulty, denies SOB or dizziness.   HR monitored during gait, elevated to 130 bpm. No further follow-up Physical Therapy or equipment needs.  Pt reports he plans to d/c home with friends to have assist available (lives alone). PT is signing off. Thank you for this referral.     Follow Up Recommendations No PT follow up    Equipment Recommendations  None recommended by PT    Recommendations for Other Services       Precautions / Restrictions Precautions Precautions: None      Mobility  Bed Mobility               General bed mobility comments: pt up in recliner  Transfers Overall transfer level: Needs assistance   Transfers: Sit to/from Stand Sit to Stand: Supervision;Modified independent (Device/Increase time)            Ambulation/Gait Ambulation/Gait assistance: Supervision;Modified independent (Device/Increase time) Ambulation Distance (Feet): 800 Feet Assistive device: None Gait Pattern/deviations: WFL(Within Functional Limits)     General Gait Details: pt used IV pole, no unsteadiness or LOB observed, HR up to 130  Stairs            Wheelchair Mobility    Modified Rankin (Stroke Patients Only)       Balance                                              Pertinent Vitals/Pain Pain Assessment: No/denies pain  HR at rest 106 bpm HR during ambulation elevated to 130 bpm    Home Living Family/patient expects to be discharged to:: Private residence Living Arrangements: Alone             Home Equipment: None Additional Comments: plans to d/c home with friends for assist as needed    Prior Function Level of Independence: Independent               Hand Dominance        Extremity/Trunk Assessment               Lower Extremity Assessment: Overall WFL for tasks assessed         Communication   Communication: No difficulties  Cognition Arousal/Alertness: Awake/alert Behavior During Therapy: WFL for tasks assessed/performed Overall Cognitive Status: Within Functional Limits for tasks assessed                      General Comments      Exercises        Assessment/Plan    PT Assessment Patent does not need any further PT services  PT Diagnosis     PT Problem List    PT Treatment Interventions  PT Goals (Current goals can be found in the Care Plan section) Acute Rehab PT Goals PT Goal Formulation: All assessment and education complete, DC therapy    Frequency     Barriers to discharge        Co-evaluation               End of Session   Activity Tolerance: Patient tolerated treatment well Patient left: in chair;with call bell/phone within reach           Time: 1411-1421 PT Time Calculation (min) (ACUTE ONLY): 10 min   Charges:   PT Evaluation $Initial PT Evaluation Tier I: 1 Procedure PT Treatments $Gait Training: 8-22 mins   PT G Codes:        Kimyetta Flott,KATHrine E 12/03/2014, 2:25 PM Carmelia Bake, PT, DPT 12/03/2014 Pager: [phone]

## 2014-12-03 NOTE — Progress Notes (Signed)
IP PROGRESS NOTE  Subjective:  He had several black stools last night. He continues to have abdominal pain. He reports pain in the right abdomen and right shoulder today. He reports polyuria.   Objective: Vital signs in last 24 hours: Blood pressure 125/78, pulse 100, temperature 98.2 F (36.8 C), temperature source Oral, resp. rate 20, height 5\' 9"  (1.753 m), weight 182 lb 12.2 oz (82.9 kg), SpO2 96 %.  Intake/Output from previous day: 01/10 0701 - 01/11 0700 In: 1875.5 [P.O.:240; I.V.:1635.5] Out: 3450 [Urine:3250; Stool:200]  Physical Exam: Lungs: Clear anteriorly Cardiac: Regular rate and rhythm Abdomen: Nontender, no mass Extremities: No leg edema Skin: Acne type rash at the upper chest and back   Portacath/PICC-without erythema  Lab Results:  Recent Labs  12/02/14 1658 12/03/14 0243  WBC 13.0* 12.3*  HGB 9.3* 8.9*  8.9*  HCT 27.6* 26.1*  26.2*  PLT 235 234    BMET  Recent Labs  12/02/14 0450 12/03/14 0243  NA 132* 137  K 3.1* 3.3*  3.2*  CL 106 102  CO2 25 26  GLUCOSE 86 99  BUN <5* <5*  CREATININE 0.32* 0.42*  CALCIUM 7.0* 7.6*    Studies/Results: No results found.  Medications: I have reviewed the patient's current medications.  Assessment/Plan:  1. 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  Cycle 3 FOLFIRINOX 10/31/2014.  CA-19-9 improved (33.4) on 11/13/2014.  Cycle 4 FOLFIRINOX 11/13/2014.  CA-19-9 improved (30.5) 11/26/2014.  Restaging CT 11/29/2014 consistent with progression of the primary pancreas tumor with invasion of the duodenum, respiratory lymphadenopathy, and liver metastases 2. Pain secondary to #1, improved.  3.  Psoriasis   4. Acute nausea and vomiting with cycle 1 FOLFIRINOX-emend and prophylactic Decadron were added with cycle 2, improved.  5. Urinary hesitancy-this is most likely related to narcotic analgesics, chemotherapy, and prostatic hypertrophy. 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 Improved with Flomax.  6. Anorexia/weight loss secondary to #1  7. Admission with hypotension and severe anemia secondary to upper gastrointestinal bleeding on 11/28/2013   Upper endoscopy 11/28/2013 confirmed a friable mass in the duodenum consistent with pancreas cancer invading the duodenum  Embolization of branches of the gastroduodenal and superior mesenteric arteries on 11/30/2014    The hemoglobin has stabilized following the embolization procedure 11/30/2014. The plan is to begin palliative radiation and concurrent capecitabine within the next one to 2 days. This will be continued as an outpatient.  Recommendations: 1. Begin palliative radiation and Xeloda for treatment of the bleeding pancreas mass 2. Continue narcotic analgesics for pain, I will add oral Dilaudid for breakthrough pain 3. Outpatient follow-up will be scheduled at the [facility] .    LOS: 5 days   Richard Cantu  12/03/2014, 11:42 AM

## 2014-12-04 LAB — BASIC METABOLIC PANEL
ANION GAP: 7 (ref 5–15)
CHLORIDE: 100 meq/L (ref 96–112)
CO2: 29 mmol/L (ref 19–32)
CREATININE: 0.44 mg/dL — AB (ref 0.50–1.35)
Calcium: 7.6 mg/dL — ABNORMAL LOW (ref 8.4–10.5)
GFR calc Af Amer: >90 mL/min (ref >90)
GFR calc non Af Amer: >90 mL/min (ref >90)
GLUCOSE: 102 mg/dL — AB (ref 70–99)
Potassium: 3.6 mmol/L (ref 3.5–5.1)
Sodium: 136 mmol/L (ref 135–145)

## 2014-12-04 LAB — CBC WITH DIFFERENTIAL/PLATELET
Basophils Absolute: 0 10*3/uL (ref 0.0–0.1)
Basophils Relative: 0 % (ref 0–1)
EOS ABS: 0.1 10*3/uL (ref 0.0–0.7)
Eosinophils Relative: 1 % (ref 0–5)
HEMATOCRIT: 24.5 % — AB (ref 39.0–52.0)
HEMOGLOBIN: 8.2 g/dL — AB (ref 13.0–17.0)
Lymphocytes Relative: 10 % — ABNORMAL LOW (ref 12–46)
Lymphs Abs: 1 10*3/uL (ref 0.7–4.0)
MCH: 29.7 pg (ref 26.0–34.0)
MCHC: 33.5 g/dL (ref 30.0–36.0)
MCV: 88.8 fL (ref 78.0–100.0)
MONO ABS: 1.2 10*3/uL — AB (ref 0.1–1.0)
MONOS PCT: 11 % (ref 3–12)
NEUTROS PCT: 78 % — AB (ref 43–77)
Neutro Abs: 8.1 10*3/uL — ABNORMAL HIGH (ref 1.7–7.7)
Platelets: 249 10*3/uL (ref 150–400)
RBC: 2.76 MIL/uL — ABNORMAL LOW (ref 4.22–5.81)
RDW: 16.1 % — ABNORMAL HIGH (ref 11.5–15.5)
WBC: 10.4 10*3/uL (ref 4.0–10.5)

## 2014-12-04 NOTE — Progress Notes (Signed)
[facility] PROGRESS NOTE  Richard Cantu MVH:[id number] DOB: 07/20/1956 DOA: 11/28/2014 PCP: Ned Card, NP Interim summary: 59 year old male with h/o pancreatic cancer, comes in for GI bleed and hypotension. CT ABD and pelvis revealed invasion of the pancreatic cancer int o the duodenum. He underwent emergent arteriogram with multi vessel embolization by IR and his bleeding appears to have stopped. Radiation oncology consulted for palliative radiation treatments .   Assessment/Plan: GI bleed: Secondary to bleeding from the friable mass in the duodenum, invasion of the pancreatic cancer into the duodenum.   Further drop in bp and hemoglobin on 1/8. Another 3 units of prbc transfusion ordered. He received a total of 8 unitsof prbc this admission. Transfuse to keep hemoglobin greater than 7.  IR consulted for S/p emergent visceral arteriogram with multi-vessel embolization on 1/8. His H&h remains stable. Advanced diet.    Hypotension: improved with prbc transfusions and fluid boluses   Pancreatic cancer: Dr Learta Codding on board. CT abdomen and pelvis showed progression of the cancer with necrotic pancreatic mass likely invaded into the third portion of the duodenum. Radiation oncology consulted and plan for 3 to 4 radiation treatment sessions. Plan to discharge after one treatment.    Anemia of blood loss: H&h stable today.  Received a total of 8  units of prbc transfusion. Monitor hemoglobin in am.    Hypokalemia:  replete as needed.   Code Status: full code Family Communication: discussed with family at bedside Disposition Plan: possible transfer in 1 to 2 days.   Consultants:  Gastroenterology  Oncology  IR  Radiation oncology.   Procedures:  EGD  Embolization procedure by IR  CT ABD and pelvis.   Antibiotics:  NONE  HPI/Subjective: Painbetter controlled with dilaudid . Increased xanax.   Objective: Filed Vitals:   12/04/14 1400  BP: 111/70  Pulse: 101   Temp: 98.1 F (36.7 C)  Resp: 18    Intake/Output Summary (Last 24 hours) at 12/04/14 1611 Last data filed at 12/04/14 1336  Gross per 24 hour  Intake    123 ml  Output    200 ml  Net    -77 ml   Filed Weights   11/28/14 0021 12/02/14 0400  Weight: 73.936 kg (163 lb) 82.9 kg (182 lb 12.2 oz)    Exam:   General:  Alert  Appears exhausted.   Cardiovascular: s1s2  Respiratory: ctab  Abdomen: soft mildly tender diffuse  Musculoskeletal: no pedal edema.   Data Reviewed: Basic Metabolic Panel:  Recent Labs Lab 11/30/14 0400 12/01/14 0400 12/02/14 0450 12/03/14 0243 12/04/14 0400  NA 137 140 132* 137 136  K 3.6 3.5 3.1* 3.3*  3.2* 3.6  CL 108 110 106 102 100  CO2 22 24 25 26 29   GLUCOSE 137* 128* 86 99 102*  BUN 12 12 <5* <5* <5*  CREATININE 0.49* 0.44* 0.32* 0.42* 0.44*  CALCIUM 7.0* 7.1* 7.0* 7.6* 7.6*  MG  --   --   --  1.7  --    Liver Function Tests:  Recent Labs Lab 11/28/14 0825 11/29/14 0430 11/30/14 0400 12/01/14 0400 12/02/14 0450  AST 15 17 20 21 19   ALT 15 16 15 18 15   ALKPHOS 44 51 39 37* 43  BILITOT 0.5 0.6 0.7 0.6 0.7  PROT 4.4* 5.0* 3.8* 3.8* 4.2*  ALBUMIN 2.2* 2.5* 1.9* 1.9* 2.1*    Recent Labs Lab 11/28/14 0043  LIPASE 660*   No results for input(s): AMMONIA in the last  168 hours. CBC:  Recent Labs Lab 11/28/14 1510 11/29/14 0430 11/29/14 1430 11/29/14 1845 11/30/14 0549  12/02/14 0450 12/02/14 1658 12/03/14 0243 12/03/14 1400 12/04/14 0400  WBC 5.9 6.2 5.9 8.5 7.1  --   --  13.0* 12.3*  --  10.4  NEUTROABS 4.0 4.5 4.0  --  5.3  --   --   --   --   --  8.1*  HGB 8.4* 8.8* 7.9* 6.7* 5.4*  < > 8.9* 9.3* 8.9*  8.9* 8.9* 8.2*  HCT 25.0* 26.0* 23.5* 19.4* 15.9*  < > 26.1* 27.6* 26.1*  26.2* 26.5* 24.5*  MCV 87.1 87.0 87.7 86.6 87.8  --   --  86.5 87.9  --  88.8  PLT 222 238 243 279 222  --   --  235 234  --  249  < > = values in this interval not displayed. Cardiac Enzymes:  Recent Labs Lab 11/28/14 0043   TROPONINI <0.03   BNP (last 3 results) No results for input(s): PROBNP in the last 8760 hours. CBG: No results for input(s): GLUCAP in the last 168 hours.  Recent Results (from the past 240 hour(s))  MRSA PCR Screening     Status: None   Collection Time: 11/28/14  6:42 AM  Result Value Ref Range Status   MRSA by PCR NEGATIVE NEGATIVE Final    Comment:        The GeneXpert MRSA Assay (FDA approved for NASAL specimens only), is one component of a comprehensive MRSA colonization surveillance program. It is not intended to diagnose MRSA infection nor to guide or monitor treatment for MRSA infections.      Studies: No results found.  Scheduled Meds: . sodium chloride   Intravenous Once  . ALPRAZolam  0.5 mg Oral Once  . antiseptic oral rinse  7 mL Mouth Rinse q12n4p  . capecitabine  1,500 mg Oral BID PC  . chlorhexidine  15 mL Mouth Rinse BID  . lactose free nutrition  237 mL Oral TID WC  . morphine  100 mg Oral Q12H  . pantoprazole  40 mg Oral Daily  . potassium chloride  40 mEq Oral Once  . sodium chloride  10-40 mL Intracatheter Q12H  . sodium chloride  3 mL Intravenous Q12H  . tamsulosin  0.4 mg Oral Daily   Continuous Infusions: . sodium chloride 10 mL/hr at 12/03/14 1013    Active Problems:   GIB (gastrointestinal bleeding)   Protein-calorie malnutrition, severe   Severe malnutrition   Bleeding    Time spent: 25 min    [facility] Pager [phone]. If 7PM-7AM, please contact night-coverage at [email or web address], password [facility] 12/04/2014, 4:11 PM  LOS: 6 days

## 2014-12-05 ENCOUNTER — Other Ambulatory Visit: Payer: Self-pay | Admitting: *Deleted

## 2014-12-05 DIAGNOSIS — C250 Malignant neoplasm of head of pancreas: Secondary | ICD-10-CM

## 2014-12-05 DIAGNOSIS — R630 Anorexia: Secondary | ICD-10-CM

## 2014-12-05 DIAGNOSIS — N400 Enlarged prostate without lower urinary tract symptoms: Secondary | ICD-10-CM | POA: Insufficient documentation

## 2014-12-05 DIAGNOSIS — K922 Gastrointestinal hemorrhage, unspecified: Secondary | ICD-10-CM | POA: Insufficient documentation

## 2014-12-05 DIAGNOSIS — K219 Gastro-esophageal reflux disease without esophagitis: Secondary | ICD-10-CM | POA: Insufficient documentation

## 2014-12-05 DIAGNOSIS — M6281 Muscle weakness (generalized): Secondary | ICD-10-CM

## 2014-12-05 DIAGNOSIS — E43 Unspecified severe protein-calorie malnutrition: Secondary | ICD-10-CM

## 2014-12-05 LAB — CBC
HCT: 24.3 % — ABNORMAL LOW (ref 39.0–52.0)
Hemoglobin: 8.1 g/dL — ABNORMAL LOW (ref 13.0–17.0)
MCH: 29.8 pg (ref 26.0–34.0)
MCHC: 33.3 g/dL (ref 30.0–36.0)
MCV: 89.3 fL (ref 78.0–100.0)
Platelets: 291 10*3/uL (ref 150–400)
RBC: 2.72 MIL/uL — AB (ref 4.22–5.81)
RDW: 15.4 % (ref 11.5–15.5)
WBC: 10.2 10*3/uL (ref 4.0–10.5)

## 2014-12-05 MED ORDER — CAPECITABINE 500 MG PO TABS
1500.0000 mg | ORAL_TABLET | ORAL | Status: DC
Start: 2014-12-06 — End: 2014-12-05
  Filled 2014-12-05: qty 3

## 2014-12-05 MED ORDER — PANTOPRAZOLE SODIUM 40 MG PO TBEC: 40.0000 mg | DELAYED_RELEASE_TABLET | Freq: Every day | ORAL | Status: DC

## 2014-12-05 MED ORDER — HEPARIN SOD (PORK) LOCK FLUSH 100 UNIT/ML IV SOLN
500.0000 [IU] | INTRAVENOUS | Status: AC | PRN
  Administered 2014-12-05: 500 [IU]

## 2014-12-05 MED ORDER — CAPECITABINE 500 MG PO TABS: ORAL_TABLET | ORAL | Status: DC

## 2014-12-05 MED ORDER — MORPHINE SULFATE ER 100 MG PO TBCR: 100.0000 mg | EXTENDED_RELEASE_TABLET | Freq: Two times a day (BID) | ORAL | Status: AC

## 2014-12-05 MED ORDER — HYDROMORPHONE HCL 8 MG PO TABS: 8.0000 mg | ORAL_TABLET | ORAL | Status: DC | PRN

## 2014-12-05 MED ORDER — GI COCKTAIL ~~LOC~~
30.0000 mL | Freq: Two times a day (BID) | ORAL | Status: DC | PRN
  Administered 2014-12-05: 30 mL via ORAL
  Filled 2014-12-05 (×2): qty 30

## 2014-12-05 NOTE — Discharge Summary (Signed)
Physician Discharge Summary  Richard Cantu LPF:[id number] DOB: Aug 10, 1956 DOA: 11/28/2014  PCP: Ned Card, NP  Admit date: 11/28/2014 Discharge date: 12/05/2014  Time spent: >30 minutes  Recommendations for Outpatient Follow-up:  Repeat CBC to follow Hgb trend BMET to follow electrolytes and renal function  Discharge Diagnoses:  Active Problems:   GIB (gastrointestinal bleeding)   Protein-calorie malnutrition, severe   Severe malnutrition   Bleeding   Discharge Condition: stable. Patient will be discharge home and would follow with PCP, oncologist and radiation oncologist as an outpatient  Diet recommendation: soft diet and feeding supplements   Filed Weights   11/28/14 0021 12/02/14 0400  Weight: 73.936 kg (163 lb) 82.9 kg (182 lb 12.2 oz)    History of present illness:  59 y.o. year old male with significant past medical history of pancreatic cancer on chemotherapy, HTN, GERD presenting with GIB, hypotension, acute blood loss anemia. Pt reports black stools over past 1-2 days. Has received 4 rounds of chemotherapy for pancreatic cancer since 07/2014 w/ improving markers and pain. Is due for next round of chemo 11/28/2014. Stools then progressed to bright red x 4-5 over past 24 hours. Had appt w/ oncology yesterday. Noted LLN BPs. Was IVFs. Per pt, he notified HCP of black stools. Was informed that if stools worsened to go to ER. Denies any prior hx/o GI disease prior to presentation.  Presented to ER T 98.9, HR 90s-120s, resp 10s-20s, BP 70s-100s-mainly in 80s. Satting 98% on RA. hgb 5.6  Hemoccult positive.   Hospital Course:  Acute blood loss anemia due to GI bleed: -Secondary to bleeding from the friable mass in the duodenum, due to invasion of the pancreatic cancer into the duodenum.  -He received a total of 8 unitsof prbc this admission. Plans is to Transfuse to keep hemoglobin greater than 7.  -IR consulted for S/p emergent visceral arteriogram with multi-vessel  embolization on 1/8.  -His H&h remains stable since procedure and is now tolerating diet and w/o further bleeding -chronic pain; probably related to malignancy. Well controlled with dilaudid and MS contin   Hypotension: due to hypovolemia secondary to blood loss  -improved/resolved with prbc transfusions and fluid boluses  Pancreatic cancer: -Dr Learta Codding on board. CT abdomen and pelvis showed progression of the cancer with necrotic pancreatic mass likely invaded into the third portion of the duodenum.  -plan is for radiation therapy along with chemotherapy  Anemia of blood loss: H&h stable at discharge; Hgb 8.1  Received a total of 8 units of prbc transfusion.  Close follow up of CBC in outpatient setting to follow Hgb trend  Hypokalemia:  repleted  Severe protein calorie malnutrition: continue feeding supplements   Procedures:  EGD (demonstarted duodenum friable mass consistently with protrusion from pancreatic malignancy)  Embolization procedure by IR  See below for xray reports   Consultations:  Oncology  Radiation oncology  GI  IR  Discharge Exam: Filed Vitals:   12/05/14 1440  BP: 115/72  Pulse: 99  Temp: 99.1 F (37.3 C)  Resp: 19    General: no CP, no nausea, no vomiting and able to tolerate diet and oral meds Cardiovascular: S1 and S2, no rubs or gallops Respiratory: CTA bilaterally Abd: soft, positive BS, no distension   Discharge Instructions   Discharge Instructions    Discharge instructions    Complete by:  As directed   Take medications as prescribed Follow with radiation oncology in am (12/06/14) for initiation of therapy Follow with oncology service (Dr. Benay Spice)  next week; cancer center will contact you with appointment details Keep yourself well hydrated          Current Discharge Medication List    START taking these medications   Details  capecitabine (XELODA) 500 MG tablet To be taken as instructed by Oncologist.  Prescription sent to outpatient pharmacy   Associated Diagnoses: Cancer of head of pancreas    HYDROmorphone (DILAUDID) 8 MG tablet Take 1-1.5 tablets (8-12 mg total) by mouth every 4 (four) hours as needed for moderate pain or severe pain. Qty: 30 tablet, Refills: 0   Associated Diagnoses: Cancer of head of pancreas    pantoprazole (PROTONIX) 40 MG tablet Take 1 tablet (40 mg total) by mouth daily. Qty: 30 tablet, Refills: 1      CONTINUE these medications which have CHANGED   Details  morphine (MS CONTIN) 100 MG 12 hr tablet Take 1 tablet (100 mg total) by mouth every 12 (twelve) hours. Qty: 30 tablet, Refills: 0   Associated Diagnoses: Cancer of head of pancreas      CONTINUE these medications which have NOT CHANGED   Details  ALPRAZolam (XANAX) 1 MG tablet Take 1 tablet (1 mg total) by mouth at bedtime as needed for anxiety. Qty: 30 tablet, Refills: 0    lactose free nutrition (BOOST) LIQD Take 237 mLs by mouth 4 (four) times daily.    lidocaine-prilocaine (EMLA) cream Apply 1 application topically as needed. Apply to [facility] site 1-2 hours prior to stick and cover with plastic wrap Qty: 30 g, Refills: 11   Associated Diagnoses: Cancer of head of pancreas    prochlorperazine (COMPAZINE) 10 MG tablet Take 1 tablet (10 mg total) by mouth every 6 (six) hours as needed for nausea. Qty: 60 tablet, Refills: 1   Associated Diagnoses: Cancer of head of pancreas    tamsulosin (FLOMAX) 0.4 MG CAPS capsule Take 1 capsule (0.4 mg total) by mouth daily. Qty: 30 capsule, Refills: 1      STOP taking these medications     ibuprofen (ADVIL,MOTRIN) 200 MG tablet      morphine (MSIR) 15 MG tablet      dexamethasone (DECADRON) 4 MG tablet      ondansetron (ZOFRAN) 8 MG tablet      phenazopyridine (PYRIDIUM) 200 MG tablet      polyethylene glycol (MIRALAX / GLYCOLAX) packet      senna-docusate (SENOKOT-S) 8.6-50 MG per tablet        No Known Allergies Follow-up Information     Follow up with Ned Card, NP. Schedule an appointment as soon as possible for a visit in 10 days.   Specialty:  Nurse Practitioner   Contact information:   [place] [address] [phone]       Follow up with Betsy Coder, MD.   Specialty:  Oncology   Why:  appointment next week, cancer center will contcat you with appointment details    Contact information:   [place] [address] [phone]        The results of significant diagnostics from this hospitalization (including imaging, microbiology, ancillary and laboratory) are listed below for reference.    Significant Diagnostic Studies: Ct Abdomen Pelvis W Wo Contrast  11/29/2014   CLINICAL DATA:  59 year old male with history of pancreatic cancer diagnosed in October 2015. Abdominal pain. History of chronic pancreatitis. Assess for potential resectability of the mass.  EXAM: CT ABDOMEN AND PELVIS WITHOUT AND WITH CONTRAST  TECHNIQUE: Multidetector  CT imaging of the abdomen and pelvis was performed following the standard protocol before and following the bolus administration of intravenous contrast.  CONTRAST:  121mL OMNIPAQUE IOHEXOL 300 MG/ML  SOLN  COMPARISON:  CT of the abdomen and pelvis 08/30/2014.  FINDINGS: Lower chest: Areas of mild scarring or subsegmental atelectasis are noted in the lung bases bilaterally. Small amount of right-sided pleural thickening.  Hepatobiliary: Compared to the prior examination there are now numerous hypovascular hepatic lesions, compatible with widespread metastatic disease to the liver. The largest of these measure up to 3.5 x 1.8 cm in segment 7. Mild periportal edema. Mild intra and extrahepatic biliary ductal dilatation, with common bile duct measuring up to 9 mm in the porta hepatis. Gallbladder appears moderately distended, but is otherwise unremarkable in appearance.  Pancreas: The previously described mass in the head and uncinate process of the  pancreas has markedly enlarged and is now centrally necrotic. There is an air-fluid level within the centrally necrotic portion of the mass, which suggests probable erosion of the lesion into the adjacent third portion of the duodenum. This mass is irregular in shape, and therefore difficult to measure, but is estimated to measure approximately 5.7 x 9.5 x 6.0 cm on today's examination. This mass now completely encircles the proximal superior mesenteric artery which is markedly narrowed. The mass comes in close contact with the posterior aspect of the superior mesenteric vein over approximately 1/3 of the circumference of the vessel, and is intimately associated with the splenoportal confluence, immediately beneath which the superior mesenteric vein is very narrowed and nearly completely occluded, best appreciated on coronal image 51. The splenic vein remains patent at this time, as does the portal vein. Posterior extension of the mass is now causing some compression of the left renal vein which appears markedly narrowed. There is also obscuration of the fat plane between the mass and the adjacent inferior vena cava, best appreciated on axial image 73 of series 3, and coronal image 79.  Spleen: Unremarkable.  Adrenals/Urinary Tract: Bilateral adrenal glands and bilateral kidneys are normal in appearance. No hydroureteronephrosis. Urinary bladder is normal in appearance.  Stomach/Bowel: Normal appearance of the stomach. 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  Vascular/Lymphatic: Numerous prominent peripancreatic lymph nodes are noted, borderline enlarged and mildly enlarged (up to 12 mm in short axis), presumably metastatic. Other enlarged retroperitoneal lymph nodes measuring up to 21 x 26 mm in the  left para-aortic station (image 40 of series 5). Vascular findings relevant to be pancreatic lesion, as above. Atherosclerosis throughout the abdominal and pelvic vasculature, without evidence of aneurysm or dissection.  Reproductive: Prostate gland and seminal vesicles are unremarkable in appearance.  Other: Small volume of ascites.  No pneumoperitoneum.  Musculoskeletal: There are no aggressive appearing lytic or blastic lesions noted in the visualized portions of the skeleton.  IMPRESSION: 1. Today's study demonstrates progression of disease with marked enlargement of what is now a 5.7 x 9.5 x 6.0 cm centrally necrotic pancreatic mass which has likely invaded into the third portion of the duodenum, with extensive peripancreatic and retroperitoneal lymphadenopathy and numerous new hepatic metastasis, as above. There is vascular involvement, as detailed above. Additionally, this mass appears to be causing mild biliary tract obstruction. 2. Additional incidental findings, as above. These results were called by telephone  at the time of interpretation on 11/29/2014 at 2:35 pm to Dr. Betsy Coder , who verbally acknowledged these results.   Electronically Signed   By: Vinnie Langton M.D.   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 Endoscopy on 11/28/2014 demonstrated friable tumor extending into the lumen of the duodenum. CT has demonstrated invasion and encasement of the SMA trunk by the large duodenal tumor.  EXAM: 1. ULTRASOUND GUIDANCE FOR VASCULAR ACCESS OF THE RIGHT COMMON FEMORAL ARTERY 2. SELECTIVE ARTERIOGRAPHY OF THE CELIAC AXIS 3. SELECTIVE ARTERIOGRAPHY OF THE COMMON HEPATIC ARTERY 4. SELECTIVE ARTERIOGRAPHY OF THE GASTRODUODENAL ARTERY 5. SELECTIVE ARTERIOGRAPHY OF A PANCREATICODUODENAL TRUNK OFF OF THE  GASTRODUODENAL ARTERY 6. TRANSCATHETER COIL EMBOLIZATION OF PANCREATICODUODENAL TRUNK 7. TRANSCATHETER COIL EMBOLIZATION OF GASTRODUODENAL ARTERY 8. SELECTIVE ARTERIOGRAPHY OF THE SUPERIOR MESENTERIC ARTERY 9. ADDITIONAL SECOND ORDER ARTERIOGRAPHY TIMES TWO OF SUPERIOR MESENTERIC ARTERY BRANCHES 10. TRANSCATHETER COIL EMBOLIZATION OF SUPERIOR MESENTERIC ARTERY  ANESTHESIA/SEDATION: Sedation:  2.5 mg IV Versed sec, 150 mcg IV fentanyl  Total Moderate Sedation Time:  84 minutes.  MEDICATIONS: 1.5 mg IV Dilaudid  CONTRAST:  152mL OMNIPAQUE IOHEXOL 300 MG/ML  SOLN  FLUOROSCOPY TIME:  24 min and 24 seconds.  PROCEDURE: Prior to the procedure, informed consent was obtained from the patient for visceral arteriography with possible transcatheter embolization to treat active upper GI bleeding. Maximal barrier sterile technique was utilized including caps, mask, sterile gowns, sterile gloves, sterile drape, hand hygiene and skin antiseptic.  The right groin was prepped with Betadine. Local anesthesia was provided with 1% lidocaine. A time-out was performed prior to the procedure.  Ultrasound was used to confirm patency of the right common femoral artery. Under direct ultrasound guidance, the artery was accessed with a micropuncture set. A 5 French sheath was placed over a guidewire. A 5 [place] cobra catheter was advanced into the abdominal aorta. This is used to selectively catheterize the celiac axis. Selective arteriography was through the 5 French catheter.  The 5 French catheter was further advanced into the common hepatic artery. Selective arteriography was performed. A micro catheter was then advanced through the 5 French catheter into a pancreaticoduodenal artery emanating off of the proximal gastroduodenal artery. Selective arteriography was performed. Transcatheter embolization of the pancreaticoduodenal branch was then performed utilizing 2 mm and 3 mm diameter Interlock coils. Arteriography was performed through  the micro catheter following embolization.  The micro catheter was then redirected into the main trunk of the gastroduodenal artery. Selective arteriography was performed. The gastroduodenal artery was then embolized utilizing interlock coils bearing in diameter from 3 mm up to 5 mm. Arteriography was performed through the micro catheter following embolization.  The micro catheter was removed. The superior mesenteric artery was catheterized with the 5 French catheter. Selective arteriography was performed. A micro catheter was advanced into a second order SMA branch. Selective arteriography was performed. A micro catheter was then further advanced in the superior mesenteric artery and additional selective arteriography performed. Transcatheter coil embolization was then performed of the superior mesenteric artery with coils ranging in diameter from 5 mm down to 3 mm. Additional arteriography was then performed through the 5 French catheter positioned in the proximal superior mesenteric artery.  Oblique arteriography was performed at the level of right groin access.  COMPLICATIONS: None immediate.  FINDINGS: Celiac arteriography and common hepatic arteriography  shows a patent gastroduodenal artery. There is a prominent lateral pancreaticoduodenal branch off of the proximal GDA. Selective arteriography at the level of the pancreaticoduodenal branch demonstrated a focal pseudoaneurysm at the level of the pancreatic head or proximal duodenum. Tumor blush was also identified. This branch was successfully occluded with coils.  The gastroduodenal artery trunk was also embolized successfully with coils resulting in complete angiographic occlusion.  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 Once a micro catheter was advanced into the  region of extravasation, contrast injection shows opacification of the duodenum lumen. The bleeding segment of the SMA trunk, including the pseudoaneurysm was able to be successfully occluded by embolization coils. Branch vessel patency was preserved and there is collateral reconstitution of distal branches beyond the SMA trunk. The inferior mesenteric artery is presumably also supplying collateral supply. The IMA was not injected.  IMPRESSION: 1. 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 2. Tumor erosion of the SMA trunk with pseudoaneurysm formation and overt contrast extravasation into the duodenal lumen. The SMA trunk was embolized with coils, preserve ring proximal branch patency. There is collateral reconstitution of multiple distal branches with SMA injection. The IMA is also presumably supplying additional collateral vessels to bowel.   Electronically Signed   By: Aletta Edouard M.D.   On: 11/30/2014 16:13   Ir Angiogram Visceral Selective  11/30/2014   CLINICAL DATA:  Progression of metastatic pancreatic carcinoma with invasion of the duodenum and active bleeding requiring multiple units of blood transfusion overnight. Endoscopy on 11/28/2014 demonstrated friable tumor extending into the lumen of the duodenum. CT has demonstrated invasion and encasement of the SMA trunk by the large duodenal tumor.  EXAM: 1. ULTRASOUND GUIDANCE FOR VASCULAR ACCESS OF THE RIGHT COMMON FEMORAL ARTERY 2. SELECTIVE ARTERIOGRAPHY OF THE CELIAC AXIS 3. SELECTIVE ARTERIOGRAPHY OF THE COMMON HEPATIC ARTERY 4. SELECTIVE ARTERIOGRAPHY OF THE GASTRODUODENAL ARTERY 5. SELECTIVE ARTERIOGRAPHY OF A PANCREATICODUODENAL TRUNK OFF OF THE GASTRODUODENAL ARTERY 6. TRANSCATHETER COIL EMBOLIZATION OF PANCREATICODUODENAL TRUNK 7. TRANSCATHETER COIL EMBOLIZATION OF  GASTRODUODENAL ARTERY 8. SELECTIVE ARTERIOGRAPHY OF THE SUPERIOR MESENTERIC ARTERY 9. ADDITIONAL SECOND ORDER ARTERIOGRAPHY TIMES TWO OF SUPERIOR MESENTERIC ARTERY BRANCHES 10. TRANSCATHETER COIL EMBOLIZATION OF SUPERIOR MESENTERIC ARTERY  ANESTHESIA/SEDATION: Sedation:  2.5 mg IV Versed sec, 150 mcg IV fentanyl  Total Moderate Sedation Time:  84 minutes.  MEDICATIONS: 1.5 mg IV Dilaudid  CONTRAST:  133mL OMNIPAQUE IOHEXOL 300 MG/ML  SOLN  FLUOROSCOPY TIME:  24 min and 24 seconds.  PROCEDURE: Prior to the procedure, informed consent was obtained from the patient for visceral arteriography with possible transcatheter embolization to treat active upper GI bleeding. Maximal barrier sterile technique was utilized including caps, mask, sterile gowns, sterile gloves, sterile drape, hand hygiene and skin antiseptic.  The right groin was prepped with Betadine. Local anesthesia was provided with 1% lidocaine. A time-out was performed prior to the procedure.  Ultrasound was used to confirm patency of the right common femoral artery. Under direct ultrasound guidance, the artery was accessed with a micropuncture set. A 5 French sheath was placed over a guidewire. A 5 [place] cobra catheter was advanced into the abdominal aorta. This is used to selectively catheterize the celiac axis. Selective arteriography was through  the 5 [place] catheter.  The 5 French catheter was further advanced into the common hepatic artery. Selective arteriography was performed. A micro catheter was then advanced through the 5 French catheter into a pancreaticoduodenal artery emanating off of the proximal gastroduodenal artery. Selective arteriography was performed. Transcatheter embolization of the pancreaticoduodenal branch was then performed utilizing 2 mm and 3 mm diameter Interlock coils. Arteriography was performed through the micro catheter following embolization.  The micro catheter was then redirected into the main trunk of the gastroduodenal  artery. Selective arteriography was performed. The gastroduodenal artery was then embolized utilizing interlock coils bearing in diameter from 3 mm up to 5 mm. Arteriography was performed through the micro catheter following embolization.  The micro catheter was removed. The superior mesenteric artery was catheterized with the 5 French catheter. Selective arteriography was performed. A micro catheter was advanced into a second order SMA branch. Selective arteriography was performed. A micro catheter was then further advanced in the superior mesenteric artery and additional selective arteriography performed. Transcatheter coil embolization was then performed of the superior mesenteric artery with coils ranging in diameter from 5 mm down to 3 mm. Additional arteriography was then performed through the 5 French catheter positioned in the proximal superior mesenteric artery.  Oblique arteriography was performed at the level of right groin access.  COMPLICATIONS: None immediate.  FINDINGS: Celiac arteriography and common hepatic arteriography shows a patent gastroduodenal artery. There is a prominent lateral pancreaticoduodenal branch off of the proximal GDA. Selective arteriography at the level of the pancreaticoduodenal branch demonstrated a focal pseudoaneurysm at the level of the pancreatic head or proximal duodenum. Tumor blush was also identified. This branch was successfully occluded with coils.  The gastroduodenal artery trunk was also embolized successfully with coils resulting in complete angiographic occlusion.  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 Once a micro catheter was advanced into the region of extravasation, contrast injection shows opacification of the duodenum lumen. The bleeding segment of the SMA trunk,  including the pseudoaneurysm was able to be successfully occluded by embolization coils. Branch vessel patency was preserved and there is collateral reconstitution of distal branches beyond the SMA trunk. The inferior mesenteric artery is presumably also supplying collateral supply. The IMA was not injected.  IMPRESSION: 1. 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 2. Tumor erosion of the SMA trunk with pseudoaneurysm formation and overt contrast extravasation into the duodenal lumen. The SMA trunk was embolized with coils, preserve ring proximal branch patency. There is collateral reconstitution of multiple distal branches with SMA injection. The IMA is also presumably supplying additional collateral vessels to bowel.   Electronically Signed   By: Aletta Edouard M.D.   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 Endoscopy on 11/28/2014 demonstrated friable tumor extending into the lumen of the duodenum. CT has demonstrated invasion and encasement of the SMA trunk by the large duodenal tumor.  EXAM: 1. ULTRASOUND GUIDANCE FOR VASCULAR ACCESS OF THE RIGHT COMMON FEMORAL ARTERY 2. SELECTIVE ARTERIOGRAPHY OF THE CELIAC AXIS 3. SELECTIVE  ARTERIOGRAPHY OF THE COMMON HEPATIC ARTERY 4. SELECTIVE ARTERIOGRAPHY OF THE GASTRODUODENAL ARTERY 5. SELECTIVE ARTERIOGRAPHY OF A PANCREATICODUODENAL TRUNK OFF OF THE GASTRODUODENAL ARTERY 6. TRANSCATHETER COIL EMBOLIZATION OF PANCREATICODUODENAL TRUNK 7. TRANSCATHETER COIL EMBOLIZATION OF GASTRODUODENAL ARTERY 8. SELECTIVE ARTERIOGRAPHY OF THE SUPERIOR MESENTERIC ARTERY 9. ADDITIONAL SECOND ORDER ARTERIOGRAPHY  TIMES TWO OF SUPERIOR MESENTERIC ARTERY BRANCHES 10. TRANSCATHETER COIL EMBOLIZATION OF SUPERIOR MESENTERIC ARTERY  ANESTHESIA/SEDATION: Sedation:  2.5 mg IV Versed sec, 150 mcg IV fentanyl  Total Moderate Sedation Time:  84 minutes.  MEDICATIONS: 1.5 mg IV Dilaudid  CONTRAST:  110mL OMNIPAQUE IOHEXOL 300 MG/ML  SOLN  FLUOROSCOPY TIME:  24 min and 24 seconds.  PROCEDURE: Prior to the procedure, informed consent was obtained from the patient for visceral arteriography with possible transcatheter embolization to treat active upper GI bleeding. Maximal barrier sterile technique was utilized including caps, mask, sterile gowns, sterile gloves, sterile drape, hand hygiene and skin antiseptic.  The right groin was prepped with Betadine. Local anesthesia was provided with 1% lidocaine. A time-out was performed prior to the procedure.  Ultrasound was used to confirm patency of the right common femoral artery. Under direct ultrasound guidance, the artery was accessed with a micropuncture set. A 5 French sheath was placed over a guidewire. A 5 [place] cobra catheter was advanced into the abdominal aorta. This is used to selectively catheterize the celiac axis. Selective arteriography was through the 5 French catheter.  The 5 French catheter was further advanced into the common hepatic artery. Selective arteriography was performed. A micro catheter was then advanced through the 5 French catheter into a pancreaticoduodenal artery emanating off of the proximal gastroduodenal artery. Selective arteriography was performed. Transcatheter embolization of the pancreaticoduodenal branch was then performed utilizing 2 mm and 3 mm diameter Interlock coils. Arteriography was performed through the micro catheter following embolization.  The micro catheter was then redirected into the main trunk of the gastroduodenal artery. Selective arteriography was performed. The gastroduodenal artery was then embolized utilizing interlock coils bearing  in diameter from 3 mm up to 5 mm. Arteriography was performed through the micro catheter following embolization.  The micro catheter was removed. The superior mesenteric artery was catheterized with the 5 French catheter. Selective arteriography was performed. A micro catheter was advanced into a second order SMA branch. Selective arteriography was performed. A micro catheter was then further advanced in the superior mesenteric artery and additional selective arteriography performed. Transcatheter coil embolization was then performed of the superior mesenteric artery with coils ranging in diameter from 5 mm down to 3 mm. Additional arteriography was then performed through the 5 French catheter positioned in the proximal superior mesenteric artery.  Oblique arteriography was performed at the level of right groin access.  COMPLICATIONS: None immediate.  FINDINGS: Celiac arteriography and common hepatic arteriography shows a patent gastroduodenal artery. There is a prominent lateral pancreaticoduodenal branch off of the proximal GDA. Selective arteriography at the level of the pancreaticoduodenal branch demonstrated a focal pseudoaneurysm at the level of the pancreatic head or proximal duodenum. Tumor blush was also identified. This branch was successfully occluded with coils.  The gastroduodenal artery trunk was also embolized successfully with coils resulting in complete angiographic occlusion.  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 Once a micro catheter was advanced into the region of extravasation, contrast injection shows  opacification of the duodenum lumen. The bleeding segment of the SMA trunk, including the pseudoaneurysm was able to be successfully occluded by embolization coils. Branch vessel patency was preserved and  there is collateral reconstitution of distal branches beyond the SMA trunk. The inferior mesenteric artery is presumably also supplying collateral supply. The IMA was not injected.  IMPRESSION: 1. 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 2. Tumor erosion of the SMA trunk with pseudoaneurysm formation and overt contrast extravasation into the duodenal lumen. The SMA trunk was embolized with coils, preserve ring proximal branch patency. There is collateral reconstitution of multiple distal branches with SMA injection. The IMA is also presumably supplying additional collateral vessels to bowel.   Electronically Signed   By: Aletta Edouard M.D.   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 Endoscopy on 11/28/2014 demonstrated friable tumor extending into the lumen of the duodenum. CT has demonstrated invasion and encasement of the SMA trunk by the large duodenal tumor.  EXAM: 1. ULTRASOUND GUIDANCE FOR VASCULAR ACCESS OF THE RIGHT COMMON FEMORAL ARTERY 2. SELECTIVE ARTERIOGRAPHY OF THE CELIAC AXIS 3. SELECTIVE ARTERIOGRAPHY OF THE COMMON HEPATIC ARTERY 4. SELECTIVE ARTERIOGRAPHY OF THE GASTRODUODENAL ARTERY 5. SELECTIVE ARTERIOGRAPHY OF A PANCREATICODUODENAL TRUNK OFF OF THE GASTRODUODENAL ARTERY 6. TRANSCATHETER COIL EMBOLIZATION OF PANCREATICODUODENAL TRUNK 7. TRANSCATHETER COIL EMBOLIZATION OF GASTRODUODENAL ARTERY 8. SELECTIVE ARTERIOGRAPHY OF THE SUPERIOR MESENTERIC ARTERY 9. ADDITIONAL SECOND ORDER ARTERIOGRAPHY TIMES TWO OF SUPERIOR MESENTERIC ARTERY BRANCHES 10. TRANSCATHETER COIL EMBOLIZATION OF SUPERIOR MESENTERIC ARTERY   ANESTHESIA/SEDATION: Sedation:  2.5 mg IV Versed sec, 150 mcg IV fentanyl  Total Moderate Sedation Time:  84 minutes.  MEDICATIONS: 1.5 mg IV Dilaudid  CONTRAST:  146mL OMNIPAQUE IOHEXOL 300 MG/ML  SOLN  FLUOROSCOPY TIME:  24 min and 24 seconds.  PROCEDURE: Prior to the procedure, informed consent was obtained from the patient for visceral arteriography with possible transcatheter embolization to treat active upper GI bleeding. Maximal barrier sterile technique was utilized including caps, mask, sterile gowns, sterile gloves, sterile drape, hand hygiene and skin antiseptic.  The right groin was prepped with Betadine. Local anesthesia was provided with 1% lidocaine. A time-out was performed prior to the procedure.  Ultrasound was used to confirm patency of the right common femoral artery. Under direct ultrasound guidance, the artery was accessed with a micropuncture set. A 5 French sheath was placed over a guidewire. A 5 [place] cobra catheter was advanced into the abdominal aorta. This is used to selectively catheterize the celiac axis. Selective arteriography was through the 5 French catheter.  The 5 French catheter was further advanced into the common hepatic artery. Selective arteriography was performed. A micro catheter was then advanced through the 5 French catheter into a pancreaticoduodenal artery emanating off of the proximal gastroduodenal artery. Selective arteriography was performed. Transcatheter embolization of the pancreaticoduodenal branch was then performed utilizing 2 mm and 3 mm diameter Interlock coils. Arteriography was performed through the micro catheter following embolization.  The micro catheter was then redirected into the main trunk of the gastroduodenal artery. Selective arteriography was performed. The gastroduodenal artery was then embolized utilizing interlock coils bearing in diameter from 3 mm up to 5 mm. Arteriography was performed through the micro  catheter following embolization.   The micro catheter was removed. The superior mesenteric artery was catheterized with the 5 French catheter. Selective arteriography was performed. A micro catheter was advanced into a second order SMA branch. Selective arteriography was performed. A micro catheter was then further advanced in the superior mesenteric artery and additional selective arteriography performed. Transcatheter coil embolization was then performed of the superior mesenteric artery with coils ranging in diameter from 5 mm down to 3 mm. Additional arteriography was then performed through the 5 French catheter positioned in the proximal superior mesenteric artery.  Oblique arteriography was performed at the level of right groin access.  COMPLICATIONS: None immediate.  FINDINGS: Celiac arteriography and common hepatic arteriography shows a patent gastroduodenal artery. There is a prominent lateral pancreaticoduodenal branch off of the proximal GDA. Selective arteriography at the level of the pancreaticoduodenal branch demonstrated a focal pseudoaneurysm at the level of the pancreatic head or proximal duodenum. Tumor blush was also identified. This branch was successfully occluded with coils.  The gastroduodenal artery trunk was also embolized successfully with coils resulting in complete angiographic occlusion.  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 Once a micro catheter was advanced into the region of extravasation, contrast injection shows opacification of the duodenum lumen. The bleeding segment of the SMA trunk, including the pseudoaneurysm was able to be successfully occluded by embolization coils. Branch vessel patency was preserved and there is collateral reconstitution of distal branches beyond the SMA trunk. The inferior mesenteric artery is  presumably also supplying collateral supply. The IMA was not injected.  IMPRESSION: 1. 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 2. Tumor erosion of the SMA trunk with pseudoaneurysm formation and overt contrast extravasation into the duodenal lumen. The SMA trunk was embolized with coils, preserve ring proximal branch patency. There is collateral reconstitution of multiple distal branches with SMA injection. The IMA is also presumably supplying additional collateral vessels to bowel.   Electronically Signed   By: Aletta Edouard M.D.   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 Endoscopy on 11/28/2014 demonstrated friable tumor extending into the lumen of the duodenum. CT has demonstrated invasion and encasement of the SMA trunk by the large duodenal tumor.  EXAM: 1. ULTRASOUND GUIDANCE FOR VASCULAR ACCESS OF THE RIGHT COMMON FEMORAL ARTERY 2. SELECTIVE ARTERIOGRAPHY OF THE CELIAC AXIS 3. SELECTIVE ARTERIOGRAPHY OF THE COMMON HEPATIC ARTERY 4. SELECTIVE ARTERIOGRAPHY OF THE GASTRODUODENAL ARTERY 5. SELECTIVE ARTERIOGRAPHY OF A PANCREATICODUODENAL TRUNK OFF OF THE GASTRODUODENAL ARTERY 6. TRANSCATHETER COIL EMBOLIZATION OF PANCREATICODUODENAL TRUNK 7. TRANSCATHETER COIL EMBOLIZATION OF GASTRODUODENAL ARTERY 8. SELECTIVE ARTERIOGRAPHY OF THE SUPERIOR MESENTERIC ARTERY 9. ADDITIONAL SECOND ORDER ARTERIOGRAPHY TIMES TWO OF SUPERIOR MESENTERIC ARTERY BRANCHES 10. TRANSCATHETER COIL EMBOLIZATION OF SUPERIOR MESENTERIC ARTERY  ANESTHESIA/SEDATION: Sedation:  2.5 mg IV Versed sec, 150 mcg IV fentanyl  Total Moderate Sedation Time:  84 minutes.   MEDICATIONS: 1.5 mg IV Dilaudid  CONTRAST:  132mL OMNIPAQUE IOHEXOL 300 MG/ML  SOLN  FLUOROSCOPY TIME:  24 min and 24 seconds.  PROCEDURE: Prior to  the procedure, informed consent was obtained from the patient for visceral arteriography with possible transcatheter embolization to treat active upper GI bleeding. Maximal barrier sterile technique was utilized including caps, mask, sterile gowns, sterile gloves, sterile drape, hand hygiene and skin antiseptic.  The right groin was prepped with Betadine. Local anesthesia was provided with 1% lidocaine. A time-out was performed prior to the procedure.  Ultrasound was used to confirm patency of the right common femoral artery. Under direct ultrasound guidance, the artery was accessed with a micropuncture set. A 5 French sheath was placed over a guidewire. A 5 [place] cobra catheter was advanced into the abdominal aorta. This is used to selectively catheterize the celiac axis. Selective arteriography was through the 5 French catheter.  The 5 French catheter was further advanced into the common hepatic artery. Selective arteriography was performed. A micro catheter was then advanced through the 5 French catheter into a pancreaticoduodenal artery emanating off of the proximal gastroduodenal artery. Selective arteriography was performed. Transcatheter embolization of the pancreaticoduodenal branch was then performed utilizing 2 mm and 3 mm diameter Interlock coils. Arteriography was performed through the micro catheter following embolization.  The micro catheter was then redirected into the main trunk of the gastroduodenal artery. Selective arteriography was performed. The gastroduodenal artery was then embolized utilizing interlock coils bearing in diameter from 3 mm up to 5 mm. Arteriography was performed through the micro catheter following embolization.  The micro catheter was removed. The superior mesenteric artery was catheterized with the 5 French catheter. Selective  arteriography was performed. A micro catheter was advanced into a second order SMA branch. Selective arteriography was performed. A micro catheter was then further advanced in the superior mesenteric artery and additional selective arteriography performed. Transcatheter coil embolization was then performed of the superior mesenteric artery with coils ranging in diameter from 5 mm down to 3 mm. Additional arteriography was then performed through the 5 French catheter positioned in the proximal superior mesenteric artery.  Oblique arteriography was performed at the level of right groin access.  COMPLICATIONS: None immediate.  FINDINGS: Celiac arteriography and common hepatic arteriography shows a patent gastroduodenal artery. There is a prominent lateral pancreaticoduodenal branch off of the proximal GDA. Selective arteriography at the level of the pancreaticoduodenal branch demonstrated a focal pseudoaneurysm at the level of the pancreatic head or proximal duodenum. Tumor blush was also identified. This branch was successfully occluded with coils.  The gastroduodenal artery trunk was also embolized successfully with coils resulting in complete angiographic occlusion.  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 Once a micro catheter was advanced into the region of extravasation, contrast injection shows opacification of the duodenum lumen. The bleeding segment of the SMA trunk, including the pseudoaneurysm was able to be successfully occluded by embolization coils. Branch vessel patency was preserved and there is collateral reconstitution of distal branches beyond the SMA trunk. The inferior mesenteric artery is presumably also supplying collateral supply. The IMA was not injected.  IMPRESSION: 1. 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 2. Tumor erosion of the SMA trunk with pseudoaneurysm formation and overt contrast extravasation into the duodenal lumen. The SMA trunk was embolized with coils, preserve ring proximal branch patency. There  is collateral reconstitution of multiple distal branches with SMA injection. The IMA is also presumably supplying additional collateral vessels to bowel.   Electronically Signed   By: Aletta Edouard M.D.   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 Endoscopy on 11/28/2014 demonstrated friable tumor extending into the lumen of the duodenum. CT has demonstrated invasion and encasement of the SMA trunk by the large duodenal tumor.  EXAM: 1. ULTRASOUND GUIDANCE FOR VASCULAR ACCESS OF THE RIGHT COMMON FEMORAL ARTERY 2. SELECTIVE ARTERIOGRAPHY OF THE CELIAC AXIS 3. SELECTIVE ARTERIOGRAPHY OF THE COMMON HEPATIC ARTERY 4. SELECTIVE ARTERIOGRAPHY OF THE GASTRODUODENAL ARTERY 5. SELECTIVE ARTERIOGRAPHY OF A PANCREATICODUODENAL TRUNK OFF OF THE GASTRODUODENAL ARTERY 6. TRANSCATHETER COIL EMBOLIZATION OF PANCREATICODUODENAL TRUNK 7. TRANSCATHETER COIL EMBOLIZATION OF GASTRODUODENAL ARTERY 8. SELECTIVE ARTERIOGRAPHY OF THE SUPERIOR MESENTERIC ARTERY 9. ADDITIONAL SECOND ORDER ARTERIOGRAPHY TIMES TWO OF SUPERIOR MESENTERIC ARTERY BRANCHES 10. TRANSCATHETER COIL EMBOLIZATION OF SUPERIOR MESENTERIC ARTERY  ANESTHESIA/SEDATION: Sedation:  2.5 mg IV Versed sec, 150 mcg IV fentanyl  Total Moderate Sedation Time:  84 minutes.  MEDICATIONS: 1.5 mg IV Dilaudid  CONTRAST:  193mL OMNIPAQUE IOHEXOL 300 MG/ML  SOLN  FLUOROSCOPY TIME:  24 min and 24 seconds.   PROCEDURE: Prior to the procedure, informed consent was obtained from the patient for visceral arteriography with possible transcatheter embolization to treat active upper GI bleeding. Maximal barrier sterile technique was utilized including caps, mask, sterile gowns, sterile gloves, sterile drape, hand hygiene and skin antiseptic.  The right groin was prepped with Betadine. Local anesthesia was provided with 1% lidocaine. A time-out was performed prior to the procedure.  Ultrasound was used to confirm patency of the right common femoral artery. Under direct ultrasound guidance, the artery was accessed with a micropuncture set. A 5 French sheath was placed over a guidewire. A 5 [place] cobra catheter was advanced into the abdominal aorta. This is used to selectively catheterize the celiac axis. Selective arteriography was through the 5 French catheter.  The 5 French catheter was further advanced into the common hepatic artery. Selective arteriography was performed. A micro catheter was then advanced through the 5 French catheter into a pancreaticoduodenal artery emanating off of the proximal gastroduodenal artery. Selective arteriography was performed. Transcatheter embolization of the pancreaticoduodenal branch was then performed utilizing 2 mm and 3 mm diameter Interlock coils. Arteriography was performed through the micro catheter following embolization.  The micro catheter was then redirected into the main trunk of the gastroduodenal artery. Selective arteriography was performed. The gastroduodenal artery was then embolized utilizing interlock coils bearing in diameter from 3 mm up to 5 mm. Arteriography was performed through the micro catheter following embolization.  The micro catheter was removed. The superior mesenteric artery was catheterized with the 5 French catheter. Selective arteriography was performed. A micro catheter was advanced into a second order SMA branch. Selective arteriography was performed.  A micro catheter was then further advanced in the superior mesenteric artery and additional selective arteriography performed. Transcatheter coil embolization was then performed of the superior mesenteric artery with coils ranging in diameter from 5 mm down to 3 mm. Additional arteriography was then performed through the 5 French catheter positioned in the proximal superior mesenteric artery.  Oblique arteriography was performed at the level of right groin access.  COMPLICATIONS: None immediate.  FINDINGS: Celiac arteriography and  common hepatic arteriography shows a patent gastroduodenal artery. There is a prominent lateral pancreaticoduodenal branch off of the proximal GDA. Selective arteriography at the level of the pancreaticoduodenal branch demonstrated a focal pseudoaneurysm at the level of the pancreatic head or proximal duodenum. Tumor blush was also identified. This branch was successfully occluded with coils.  The gastroduodenal artery trunk was also embolized successfully with coils resulting in complete angiographic occlusion.  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 Once a micro catheter was advanced into the region of extravasation, contrast injection shows opacification of the duodenum lumen. The bleeding segment of the SMA trunk, including the pseudoaneurysm was able to be successfully occluded by embolization coils. Branch vessel patency was preserved and there is collateral reconstitution of distal branches beyond the SMA trunk. The inferior mesenteric artery is presumably also supplying collateral supply. The IMA was not injected.  IMPRESSION: 1. Transcatheter coil embolization of the gastroduodenal artery as well as a pancreaticoduodenal branch supplying tumor with evidence of focal pseudoaneurysm in the  distribution of the pancreaticoduodenal supply. 2. Tumor erosion of the SMA trunk with pseudoaneurysm formation and overt contrast extravasation into the duodenal lumen. The SMA trunk was embolized with coils, preserve ring proximal branch patency. There is collateral reconstitution of multiple distal branches with SMA injection. The IMA is also presumably supplying additional collateral vessels to bowel.   Electronically Signed   By: Aletta Edouard M.D.   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 Endoscopy on 11/28/2014 demonstrated friable tumor extending into the lumen of the duodenum. CT has demonstrated invasion and encasement of the SMA trunk by the large duodenal tumor.  EXAM: 1. ULTRASOUND GUIDANCE FOR VASCULAR ACCESS OF THE RIGHT COMMON FEMORAL ARTERY 2. SELECTIVE ARTERIOGRAPHY OF THE CELIAC AXIS 3. SELECTIVE ARTERIOGRAPHY OF THE COMMON HEPATIC ARTERY 4. SELECTIVE ARTERIOGRAPHY OF THE GASTRODUODENAL ARTERY 5. SELECTIVE ARTERIOGRAPHY OF A PANCREATICODUODENAL TRUNK OFF OF THE GASTRODUODENAL ARTERY 6. TRANSCATHETER COIL EMBOLIZATION OF PANCREATICODUODENAL TRUNK 7. TRANSCATHETER COIL EMBOLIZATION OF GASTRODUODENAL ARTERY 8. SELECTIVE ARTERIOGRAPHY OF THE SUPERIOR MESENTERIC ARTERY 9. ADDITIONAL SECOND ORDER ARTERIOGRAPHY TIMES TWO OF SUPERIOR MESENTERIC ARTERY BRANCHES 10. TRANSCATHETER COIL EMBOLIZATION OF SUPERIOR MESENTERIC ARTERY  ANESTHESIA/SEDATION: Sedation:  2.5 mg IV Versed sec, 150 mcg IV fentanyl  Total Moderate Sedation Time:  84 minutes.  MEDICATIONS: 1.5 mg IV Dilaudid  CONTRAST:  161mL OMNIPAQUE IOHEXOL 300 MG/ML  SOLN  FLUOROSCOPY TIME:  24 min and 24 seconds.  PROCEDURE: Prior to the procedure, informed consent was obtained from the patient for visceral arteriography with possible  transcatheter embolization to treat active upper GI bleeding. Maximal barrier sterile technique was utilized including caps, mask, sterile gowns, sterile gloves, sterile drape, hand hygiene and skin antiseptic.  The right groin was prepped with Betadine. Local anesthesia was provided with 1% lidocaine. A time-out was performed prior to the procedure.  Ultrasound was used to confirm patency of the right common femoral artery. Under direct ultrasound guidance, the artery was accessed with a micropuncture set. A 5 French sheath was placed over a guidewire. A 5 [place] cobra catheter was advanced into the abdominal aorta. This is used to selectively catheterize the celiac  axis. Selective arteriography was through the 5 French catheter.  The 5 French catheter was further advanced into the common hepatic artery. Selective arteriography was performed. A micro catheter was then advanced through the 5 French catheter into a pancreaticoduodenal artery emanating off of the proximal gastroduodenal artery. Selective arteriography was performed. Transcatheter embolization of the pancreaticoduodenal branch was then performed utilizing 2 mm and 3 mm diameter Interlock coils. Arteriography was performed through the micro catheter following embolization.  The micro catheter was then redirected into the main trunk of the gastroduodenal artery. Selective arteriography was performed. The gastroduodenal artery was then embolized utilizing interlock coils bearing in diameter from 3 mm up to 5 mm. Arteriography was performed through the micro catheter following embolization.  The micro catheter was removed. The superior mesenteric artery was catheterized with the 5 French catheter. Selective arteriography was performed. A micro catheter was advanced into a second order SMA branch. Selective arteriography was performed. A micro catheter was then further advanced in the superior mesenteric artery and additional selective arteriography  performed. Transcatheter coil embolization was then performed of the superior mesenteric artery with coils ranging in diameter from 5 mm down to 3 mm. Additional arteriography was then performed through the 5 French catheter positioned in the proximal superior mesenteric artery.  Oblique arteriography was performed at the level of right groin access.  COMPLICATIONS: None immediate.  FINDINGS: Celiac arteriography and common hepatic arteriography shows a patent gastroduodenal artery. There is a prominent lateral pancreaticoduodenal branch off of the proximal GDA. Selective arteriography at the level of the pancreaticoduodenal branch demonstrated a focal pseudoaneurysm at the level of the pancreatic head or proximal duodenum. Tumor blush was also identified. This branch was successfully occluded with coils.  The gastroduodenal artery trunk was also embolized successfully with coils resulting in complete angiographic occlusion.  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 Once a micro catheter was advanced into the region of extravasation, contrast injection shows opacification of the duodenum lumen. The bleeding segment of the SMA trunk, including the pseudoaneurysm was able to be successfully occluded by embolization coils. Branch vessel patency was preserved and there is collateral reconstitution of distal branches beyond the SMA trunk. The inferior mesenteric artery is presumably also supplying collateral supply. The IMA was not injected.  IMPRESSION: 1. 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 2. Tumor erosion of the SMA trunk with pseudoaneurysm formation and  overt contrast extravasation into the duodenal lumen. The SMA trunk was embolized with coils, preserve ring proximal branch patency. There is collateral reconstitution of multiple distal branches with SMA injection. The IMA is also presumably supplying additional collateral vessels to bowel.   Electronically Signed   By: Aletta Edouard M.D.   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 Endoscopy on 11/28/2014 demonstrated friable tumor extending into the lumen of the duodenum. CT has demonstrated invasion and encasement of the SMA trunk by the large duodenal tumor.  EXAM: 1. ULTRASOUND GUIDANCE FOR VASCULAR ACCESS OF THE RIGHT COMMON FEMORAL ARTERY 2. SELECTIVE ARTERIOGRAPHY OF  THE CELIAC AXIS 3. SELECTIVE ARTERIOGRAPHY OF THE COMMON HEPATIC ARTERY 4. SELECTIVE ARTERIOGRAPHY OF THE GASTRODUODENAL ARTERY 5. SELECTIVE ARTERIOGRAPHY OF A PANCREATICODUODENAL TRUNK OFF OF THE GASTRODUODENAL ARTERY 6. TRANSCATHETER COIL EMBOLIZATION OF PANCREATICODUODENAL TRUNK 7. TRANSCATHETER COIL EMBOLIZATION OF GASTRODUODENAL ARTERY 8. SELECTIVE ARTERIOGRAPHY OF THE SUPERIOR MESENTERIC ARTERY 9. ADDITIONAL SECOND ORDER ARTERIOGRAPHY TIMES TWO OF SUPERIOR MESENTERIC ARTERY BRANCHES 10. TRANSCATHETER COIL EMBOLIZATION OF SUPERIOR MESENTERIC ARTERY  ANESTHESIA/SEDATION: Sedation:  2.5 mg IV Versed sec, 150 mcg IV fentanyl  Total Moderate Sedation Time:  84 minutes.  MEDICATIONS: 1.5 mg IV Dilaudid  CONTRAST:  176mL OMNIPAQUE IOHEXOL 300 MG/ML  SOLN  FLUOROSCOPY TIME:  24 min and 24 seconds.  PROCEDURE: Prior to the procedure, informed consent was obtained from the patient for visceral arteriography with possible transcatheter embolization to treat active upper GI bleeding. Maximal barrier sterile technique was utilized including caps, mask,  sterile gowns, sterile gloves, sterile drape, hand hygiene and skin antiseptic.  The right groin was prepped with Betadine. Local anesthesia was provided with 1% lidocaine. A time-out was performed prior to the procedure.  Ultrasound was used to confirm patency of the right common femoral artery. Under direct ultrasound guidance, the artery was accessed with a micropuncture set. A 5 French sheath was placed over a guidewire. A 5 [place] cobra catheter was advanced into the abdominal aorta. This is used to selectively catheterize the celiac axis. Selective arteriography was through the 5 French catheter.  The 5 French catheter was further advanced into the common hepatic artery. Selective arteriography was performed. A micro catheter was then advanced through the 5 French catheter into a pancreaticoduodenal artery emanating off of the proximal gastroduodenal artery. Selective arteriography was performed. Transcatheter embolization of the pancreaticoduodenal branch was then performed utilizing 2 mm and 3 mm diameter Interlock coils. Arteriography was performed through the micro catheter following embolization.  The micro catheter was then redirected into the main trunk of the gastroduodenal artery. Selective arteriography was performed. The gastroduodenal artery was then embolized utilizing interlock coils bearing in diameter from 3 mm up to 5 mm. Arteriography was performed through the micro catheter following embolization.  The micro catheter was removed. The superior mesenteric artery was catheterized with the 5 French catheter. Selective arteriography was performed. A micro catheter was advanced into a second order SMA branch. Selective arteriography was performed. A micro catheter was then further advanced in the superior mesenteric artery and additional selective arteriography performed. Transcatheter coil embolization was then performed of the superior mesenteric artery with coils ranging in diameter from 5 mm  down to 3 mm. Additional arteriography was then performed through the 5 French catheter positioned in the proximal superior mesenteric artery.  Oblique arteriography was performed at the level of right groin access.  COMPLICATIONS: None immediate.  FINDINGS: Celiac arteriography and common hepatic arteriography shows a patent gastroduodenal artery. There is a prominent lateral pancreaticoduodenal branch off of the proximal GDA. Selective arteriography at the level of the pancreaticoduodenal branch demonstrated a focal pseudoaneurysm at the level of the pancreatic head or proximal duodenum. Tumor blush was also identified. This branch was successfully occluded with coils.  The gastroduodenal artery trunk was also embolized successfully with coils resulting in complete angiographic occlusion.  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 Once a micro catheter was advanced into the region  of extravasation, contrast injection shows opacification of the duodenum lumen. The bleeding segment of the SMA trunk, including the pseudoaneurysm was able to be successfully occluded by embolization coils. Branch vessel patency was preserved and there is collateral reconstitution of distal branches beyond the SMA trunk. The inferior mesenteric artery is presumably also supplying collateral supply. The IMA was not injected.  IMPRESSION: 1. 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 2. Tumor erosion of the SMA trunk with pseudoaneurysm formation and overt contrast extravasation into the duodenal lumen. The SMA trunk was embolized with coils, preserve ring proximal branch patency. There is  collateral reconstitution of multiple distal branches with SMA injection. The IMA is also presumably supplying additional collateral vessels to bowel.   Electronically Signed   By: Aletta Edouard M.D.   On: 11/30/2014 16:13   Dg Chest Port 1 View  11/28/2014   CLINICAL DATA:  Worsening generalized weakness and shortness of breath. Initial encounter.  EXAM: PORTABLE CHEST - 1 VIEW  COMPARISON:  Chest radiograph performed 09/25/2014  FINDINGS: The lungs are well-aerated and clear. There is no evidence of focal opacification, pleural effusion or pneumothorax. Bilateral nipple shadows are seen.  The cardiomediastinal silhouette is within normal limits. A left-sided chest port is seen ending about the mid SVC. No acute osseous abnormalities are seen.  IMPRESSION: No acute cardiopulmonary process seen.   Electronically Signed   By: Garald Balding M.D.   On: 11/28/2014 00:53   [place] Guide Roadmapping  11/30/2014   CLINICAL DATA:  Progression of metastatic pancreatic carcinoma with invasion of the duodenum and active bleeding requiring multiple units of blood transfusion overnight. Endoscopy on 11/28/2014 demonstrated friable tumor extending into the lumen of the duodenum. CT has demonstrated invasion and encasement of the SMA trunk by the large duodenal tumor.  EXAM: 1. ULTRASOUND GUIDANCE FOR VASCULAR ACCESS OF THE RIGHT COMMON FEMORAL ARTERY 2. SELECTIVE ARTERIOGRAPHY OF THE CELIAC AXIS 3. SELECTIVE ARTERIOGRAPHY OF THE COMMON HEPATIC ARTERY 4. SELECTIVE ARTERIOGRAPHY OF THE GASTRODUODENAL ARTERY 5. SELECTIVE ARTERIOGRAPHY OF A PANCREATICODUODENAL TRUNK OFF OF THE GASTRODUODENAL ARTERY 6. TRANSCATHETER COIL EMBOLIZATION OF PANCREATICODUODENAL TRUNK 7. TRANSCATHETER COIL EMBOLIZATION OF GASTRODUODENAL ARTERY 8. SELECTIVE ARTERIOGRAPHY OF THE SUPERIOR MESENTERIC ARTERY 9. ADDITIONAL SECOND ORDER ARTERIOGRAPHY TIMES TWO OF SUPERIOR MESENTERIC ARTERY BRANCHES 10. TRANSCATHETER COIL  EMBOLIZATION OF SUPERIOR MESENTERIC ARTERY  ANESTHESIA/SEDATION: Sedation:  2.5 mg IV Versed sec, 150 mcg IV fentanyl  Total Moderate Sedation Time:  84 minutes.  MEDICATIONS: 1.5 mg IV Dilaudid  CONTRAST:  118mL OMNIPAQUE IOHEXOL 300 MG/ML  SOLN  FLUOROSCOPY TIME:  24 min and 24 seconds.  PROCEDURE: Prior to the procedure, informed consent was obtained from the patient for visceral arteriography with possible transcatheter embolization to treat active upper GI bleeding. Maximal barrier sterile technique was utilized including caps, mask, sterile gowns, sterile gloves, sterile drape, hand hygiene and skin antiseptic.  The right groin was prepped with Betadine. Local anesthesia was provided with 1% lidocaine. A time-out was performed prior to the procedure.  Ultrasound was used to confirm patency of the right common femoral artery. Under direct ultrasound guidance, the artery was accessed with a micropuncture set. A 5 French sheath was placed over a guidewire. A 5 [place] cobra catheter was advanced into the abdominal aorta. This is used to selectively catheterize the celiac axis. Selective arteriography  was through the 5 [place] catheter.  The 5 French catheter was further advanced into the common hepatic artery. Selective arteriography was performed. A micro catheter was then advanced through the 5 French catheter into a pancreaticoduodenal artery emanating off of the proximal gastroduodenal artery. Selective arteriography was performed. Transcatheter embolization of the pancreaticoduodenal branch was then performed utilizing 2 mm and 3 mm diameter Interlock coils. Arteriography was performed through the micro catheter following embolization.  The micro catheter was then redirected into the main trunk of the gastroduodenal artery. Selective arteriography was performed. The gastroduodenal artery was then embolized utilizing interlock coils bearing in diameter from 3 mm up to 5 mm. Arteriography was performed through  the micro catheter following embolization.  The micro catheter was removed. The superior mesenteric artery was catheterized with the 5 French catheter. Selective arteriography was performed. A micro catheter was advanced into a second order SMA branch. Selective arteriography was performed. A micro catheter was then further advanced in the superior mesenteric artery and additional selective arteriography performed. Transcatheter coil embolization was then performed of the superior mesenteric artery with coils ranging in diameter from 5 mm down to 3 mm. Additional arteriography was then performed through the 5 French catheter positioned in the proximal superior mesenteric artery.  Oblique arteriography was performed at the level of right groin access.  COMPLICATIONS: None immediate.  FINDINGS: Celiac arteriography and common hepatic arteriography shows a patent gastroduodenal artery. There is a prominent lateral pancreaticoduodenal branch off of the proximal GDA. Selective arteriography at the level of the pancreaticoduodenal branch demonstrated a focal pseudoaneurysm at the level of the pancreatic head or proximal duodenum. Tumor blush was also identified. This branch was successfully occluded with coils.  The gastroduodenal artery trunk was also embolized successfully with coils resulting in complete angiographic occlusion.  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 Once a micro catheter was advanced into the region of extravasation, contrast injection shows opacification of the duodenum lumen. The bleeding segment of the SMA trunk, including the pseudoaneurysm was able to be successfully occluded by embolization coils. Branch vessel patency was preserved and there is collateral reconstitution of distal branches beyond the SMA  trunk. The inferior mesenteric artery is presumably also supplying collateral supply. The IMA was not injected.  IMPRESSION: 1. 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 2. Tumor erosion of the SMA trunk with pseudoaneurysm formation and overt contrast extravasation into the duodenal lumen. The SMA trunk was embolized with coils, preserve ring proximal branch patency. There is collateral reconstitution of multiple distal branches with SMA injection. The IMA is also presumably supplying additional collateral vessels to bowel.   Electronically Signed   By: Aletta Edouard M.D.   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 Endoscopy on 11/28/2014 demonstrated friable tumor extending into the lumen of the duodenum. CT has demonstrated invasion and encasement of the SMA trunk by the large duodenal tumor.  EXAM: 1. ULTRASOUND GUIDANCE FOR VASCULAR ACCESS OF THE RIGHT COMMON FEMORAL ARTERY 2.  SELECTIVE ARTERIOGRAPHY OF THE CELIAC AXIS 3. SELECTIVE ARTERIOGRAPHY OF THE COMMON HEPATIC ARTERY 4. SELECTIVE ARTERIOGRAPHY OF THE GASTRODUODENAL ARTERY 5. SELECTIVE ARTERIOGRAPHY OF A PANCREATICODUODENAL TRUNK OFF OF THE GASTRODUODENAL ARTERY 6. TRANSCATHETER COIL EMBOLIZATION OF PANCREATICODUODENAL TRUNK 7. TRANSCATHETER COIL EMBOLIZATION OF GASTRODUODENAL ARTERY 8. SELECTIVE ARTERIOGRAPHY OF THE SUPERIOR MESENTERIC ARTERY 9. ADDITIONAL SECOND ORDER ARTERIOGRAPHY TIMES TWO OF SUPERIOR MESENTERIC ARTERY BRANCHES 10. TRANSCATHETER COIL EMBOLIZATION OF SUPERIOR MESENTERIC ARTERY  ANESTHESIA/SEDATION: Sedation:  2.5 mg IV Versed sec, 150 mcg IV  fentanyl  Total Moderate Sedation Time:  84 minutes.  MEDICATIONS: 1.5 mg IV Dilaudid  CONTRAST:  190mL OMNIPAQUE IOHEXOL 300 MG/ML  SOLN  FLUOROSCOPY TIME:  24 min and 24 seconds.  PROCEDURE: Prior to the procedure, informed consent was obtained from the patient for visceral arteriography with possible transcatheter embolization to treat active upper GI bleeding. Maximal barrier sterile technique was utilized including caps, mask, sterile gowns, sterile gloves, sterile drape, hand hygiene and skin antiseptic.  The right groin was prepped with Betadine. Local anesthesia was provided with 1% lidocaine. A time-out was performed prior to the procedure.  Ultrasound was used to confirm patency of the right common femoral artery. Under direct ultrasound guidance, the artery was accessed with a micropuncture set. A 5 French sheath was placed over a guidewire. A 5 [place] cobra catheter was advanced into the abdominal aorta. This is used to selectively catheterize the celiac axis. Selective arteriography was through the 5 French catheter.  The 5 French catheter was further advanced into the common hepatic artery. Selective arteriography was performed. A micro catheter was then advanced through the 5 French catheter into a pancreaticoduodenal artery emanating off of the proximal gastroduodenal artery. Selective arteriography was performed. Transcatheter embolization of the pancreaticoduodenal branch was then performed utilizing 2 mm and 3 mm diameter Interlock coils. Arteriography was performed through the micro catheter following embolization.  The micro catheter was then redirected into the main trunk of the gastroduodenal artery. Selective arteriography was performed. The gastroduodenal artery was then embolized utilizing interlock coils bearing in diameter from 3 mm up to 5 mm. Arteriography was performed through the micro catheter following embolization.  The micro catheter was removed. The superior mesenteric artery was  catheterized with the 5 French catheter. Selective arteriography was performed. A micro catheter was advanced into a second order SMA branch. Selective arteriography was performed. A micro catheter was then further advanced in the superior mesenteric artery and additional selective arteriography performed. Transcatheter coil embolization was then performed of the superior mesenteric artery with coils ranging in diameter from 5 mm down to 3 mm. Additional arteriography was then performed through the 5 French catheter positioned in the proximal superior mesenteric artery.  Oblique arteriography was performed at the level of right groin access.  COMPLICATIONS: None immediate.  FINDINGS: Celiac arteriography and common hepatic arteriography shows a patent gastroduodenal artery. There is a prominent lateral pancreaticoduodenal branch off of the proximal GDA. Selective arteriography at the level of the pancreaticoduodenal branch demonstrated a focal pseudoaneurysm at the level of the pancreatic head or proximal duodenum. Tumor blush was also identified. This branch was successfully occluded with coils.  The gastroduodenal artery trunk was also embolized successfully with coils resulting in complete angiographic occlusion.  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 Once a micro catheter was advanced  into the region of extravasation, contrast injection shows opacification of the duodenum lumen. The bleeding segment of the SMA trunk, including the pseudoaneurysm was able to be successfully occluded by embolization coils. Branch vessel patency was preserved and there is collateral reconstitution of distal branches beyond the SMA trunk. The inferior mesenteric artery is presumably also supplying collateral supply. The IMA was not injected.   IMPRESSION: 1. 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 2. Tumor erosion of the SMA trunk with pseudoaneurysm formation and overt contrast extravasation into the duodenal lumen. The SMA trunk was embolized with coils, preserve ring proximal branch patency. There is collateral reconstitution of multiple distal branches with SMA injection. The IMA is also presumably supplying additional collateral vessels to bowel.   Electronically Signed   By: Aletta Edouard M.D.   On: 11/30/2014 16:13    Microbiology: Recent Results (from the past 240 hour(s))  MRSA PCR Screening     Status: None   Collection Time: 11/28/14  6:42 AM  Result Value Ref Range Status   MRSA by PCR NEGATIVE NEGATIVE Final    Comment:        The GeneXpert MRSA Assay (FDA approved for NASAL specimens only), is one component of a comprehensive MRSA colonization surveillance program. It is not intended to diagnose MRSA infection nor to guide or monitor treatment for MRSA infections.      Labs: Basic Metabolic Panel:  Recent Labs Lab 11/30/14 0400 12/01/14 0400 12/02/14 0450 12/03/14 0243 12/04/14 0400  NA 137 140 132* 137 136  K 3.6 3.5 3.1* 3.3*  3.2* 3.6  CL 108 110 106 102 100  CO2 22 24 25 26 29   GLUCOSE 137* 128* 86 99 102*  BUN 12 12 <5* <5* <5*  CREATININE 0.49* 0.44* 0.32* 0.42* 0.44*  CALCIUM 7.0* 7.1* 7.0* 7.6* 7.6*  MG  --   --   --  1.7  --    Liver Function Tests:  Recent Labs Lab 11/29/14 0430 11/30/14 0400 12/01/14 0400 12/02/14 0450  AST 17 20 21 19   ALT 16 15 18 15   ALKPHOS 51 39 37* 43  BILITOT 0.6 0.7 0.6 0.7  PROT 5.0* 3.8* 3.8* 4.2*  ALBUMIN 2.5* 1.9* 1.9* 2.1*   CBC:  Recent Labs Lab 11/29/14 0430 11/29/14 1430  11/30/14 0549  12/02/14 1658 12/03/14 0243 12/03/14 1400 12/04/14 0400 12/05/14 0440  WBC  6.2 5.9  < > 7.1  --  13.0* 12.3*  --  10.4 10.2  NEUTROABS 4.5 4.0  --  5.3  --   --   --   --  8.1*  --   HGB 8.8* 7.9*  < > 5.4*  < > 9.3* 8.9*  8.9* 8.9* 8.2* 8.1*  HCT 26.0* 23.5*  < > 15.9*  < > 27.6* 26.1*  26.2* 26.5* 24.5* 24.3*  MCV 87.0 87.7  < > 87.8  --  86.5 87.9  --  88.8 89.3  PLT 238 243  < > 222  --  235 234  --  249 291  < > = values in this interval not displayed.  Signed:  Barton Dubois  [facility] 12/05/2014, 4:04 PM

## 2014-12-05 NOTE — Addendum Note (Signed)
Addended by: Domenic Schwab on: 12/05/2014 04:46 PM   Modules accepted: Orders

## 2014-12-05 NOTE — Progress Notes (Signed)
IP PROGRESS NOTE  Subjective:  He reports the pain is under good control. He has episodes of right abdominal pain with inspiration. No further bleeding. The stool was clay-colored today. He was able to ambulate yesterday.   Objective: Vital signs in last 24 hours: Blood pressure 108/88, pulse 104, temperature 99 F (37.2 C), temperature source Oral, resp. rate 18, height 5\' 9"  (1.753 m), weight 182 lb 12.2 oz (82.9 kg), SpO2 94 %.  Intake/Output from previous day: 01/12 0701 - 01/13 0700 In: 483 [P.O.:120; I.V.:363] Out: -   Physical Exam: Lungs: Clear bilaterally Cardiac: Regular rate and rhythm Abdomen: Nontender, no mass Extremities: No leg edema Skin: Acne type rash at the upper chest and back   Portacath/PICC-without erythema  Lab Results:  Recent Labs  12/04/14 0400 12/05/14 0440  WBC 10.4 10.2  HGB 8.2* 8.1*  HCT 24.5* 24.3*  PLT 249 291    BMET  Recent Labs  12/03/14 0243 12/04/14 0400  NA 137 136  K 3.3*  3.2* 3.6  CL 102 100  CO2 26 29  GLUCOSE 99 102*  BUN <5* <5*  CREATININE 0.42* 0.44*  CALCIUM 7.6* 7.6*    Studies/Results: No results found.  Medications: I have reviewed the patient's current medications.  Assessment/Plan:  1. 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  Cycle 3 FOLFIRINOX 10/31/2014.  CA-19-9 improved (33.4) on 11/13/2014.  Cycle 4 FOLFIRINOX 11/13/2014.  CA-19-9 improved (30.5) 11/26/2014.  Restaging CT 11/29/2014 consistent with progression of the primary pancreas tumor with invasion of the duodenum, respiratory lymphadenopathy, and liver metastases 2. Pain secondary to #1, improved.  3. Psoriasis   4. Acute  nausea and vomiting with cycle 1 FOLFIRINOX-emend and prophylactic Decadron were added with cycle 2, improved.  5. Urinary hesitancy-this is most likely related to narcotic analgesics, chemotherapy, and prostatic hypertrophy. 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 Improved with Flomax.  6. Anorexia/weight loss secondary to #1  7. Admission with hypotension and severe anemia secondary to upper gastrointestinal bleeding on 11/28/2013   Upper endoscopy 11/28/2013 confirmed a friable mass in the duodenum consistent with pancreas cancer invading the duodenum  Embolization of branches of the gastroduodenal and superior mesenteric arteries on 11/30/2014    The hemoglobin has stabilized following the embolization procedure 11/30/2014. The plan is to begin palliative radiation and concurrent capecitabine 12/06/2014. He appears stable for discharge from an oncology standpoint.  Recommendations: 1. Begin palliative radiation and Xeloda for treatment of the bleeding pancreas on 12/06/2014, I will submit a Xeloda prescription to the outpatient pharmacy today 2. Continue MS Contin and Dilaudid for pain 3. Outpatient follow-up will be scheduled at the [facility] .    LOS: 7 days   Richard Cantu  12/05/2014, 1:34 PM

## 2014-12-06 ENCOUNTER — Other Ambulatory Visit: Payer: Self-pay | Admitting: Nurse Practitioner

## 2014-12-06 ENCOUNTER — Encounter (HOSPITAL_COMMUNITY): Payer: Self-pay

## 2014-12-06 ENCOUNTER — Ambulatory Visit (HOSPITAL_COMMUNITY)
Admission: RE | Admit: 2014-12-06 | Discharge: 2014-12-06 | Disposition: A | Discharge status: Home / Self Care | Payer: BLUE CROSS/BLUE SHIELD | Source: Ambulatory Visit | Attending: Nurse Practitioner | Admitting: Nurse Practitioner

## 2014-12-06 ENCOUNTER — Encounter: Payer: Self-pay | Admitting: Oncology

## 2014-12-06 ENCOUNTER — Ambulatory Visit
Admission: RE | Admit: 2014-12-06 | Discharge: 2014-12-06 | Disposition: A | Discharge status: Home / Self Care | Payer: BLUE CROSS/BLUE SHIELD | Source: Ambulatory Visit | Attending: Radiation Oncology | Admitting: Radiation Oncology

## 2014-12-06 DIAGNOSIS — Z510 Encounter for antineoplastic radiation therapy: Secondary | ICD-10-CM | POA: Diagnosis not present

## 2014-12-06 DIAGNOSIS — C250 Malignant neoplasm of head of pancreas: Secondary | ICD-10-CM

## 2014-12-06 DIAGNOSIS — C61 Malignant neoplasm of prostate: Secondary | ICD-10-CM | POA: Diagnosis not present

## 2014-12-06 MED ORDER — RADIAPLEXRX EX GEL
Freq: Once | CUTANEOUS | Status: AC
  Administered 2014-12-06: 11:00:00 via TOPICAL

## 2014-12-06 MED ORDER — IOHEXOL 300 MG/ML  SOLN
100.0000 mL | Freq: Once | INTRAMUSCULAR | Status: AC | PRN
  Administered 2014-12-06: 100 mL via INTRAVENOUS

## 2014-12-06 NOTE — Progress Notes (Signed)
Faxed xeloda prescription to [facility] °

## 2014-12-06 NOTE — Telephone Encounter (Signed)
RECEIVED A FAX FROM [facility] CONCERNING A CONFIRMATION OF FACSIMILE RECEIPT FOR PT. REFERRAL. 

## 2014-12-06 NOTE — Progress Notes (Signed)
Pt education, , radiation therapy and you book, radiaplex gel, my business card, schedule appts, discussed  Skin irritation, nausea, vomiting,diarrhea, fatigue, weight loss, possible need for IVF"S s/p dehydration, pain, to use radiaplex on affected abdomen after rad txs  Daily and prn, when skin becomes irritated or itching, not 4 hours prior to rad txs, also discussed when to take patent's Xeloda, to take with his breakfast before coming for radiation treatments and again with evening meals daily only on days of  radiation ,(Mondays-Fridays) not on weekends, increase protein in diet, may need to go to 5-6 smaller meals , low fiber diet if diarrhea does occur  , verbal understanding, teach back given 11:34 AM

## 2014-12-07 ENCOUNTER — Other Ambulatory Visit: Payer: Self-pay | Admitting: *Deleted

## 2014-12-07 ENCOUNTER — Ambulatory Visit
Admission: RE | Admit: 2014-12-07 | Discharge: 2014-12-07 | Disposition: A | Discharge status: Home / Self Care | Payer: BLUE CROSS/BLUE SHIELD | Source: Ambulatory Visit | Attending: Radiation Oncology | Admitting: Radiation Oncology

## 2014-12-07 ENCOUNTER — Encounter: Payer: Self-pay | Admitting: Radiation Oncology

## 2014-12-07 ENCOUNTER — Ambulatory Visit: Payer: BLUE CROSS/BLUE SHIELD | Admitting: Radiation Oncology

## 2014-12-07 DIAGNOSIS — Z510 Encounter for antineoplastic radiation therapy: Secondary | ICD-10-CM | POA: Diagnosis not present

## 2014-12-07 MED ORDER — INDOMETHACIN 50 MG PO CAPS: 50.0000 mg | ORAL_CAPSULE | Freq: Every day | ORAL | Status: DC

## 2014-12-07 NOTE — Telephone Encounter (Signed)
Pt called states "I have gout in my right big toe; would Dr. Benay Spice call me in script for pain"  Per Dr. Benay Spice; notified pt that Indocin, anti-inflammatory will be called in to [facility] and if symptoms worse to notified office next week.  Pt verbalized understanding and expressed appreciation.

## 2014-12-09 ENCOUNTER — Other Ambulatory Visit: Payer: Self-pay | Admitting: Oncology

## 2014-12-09 ENCOUNTER — Emergency Department (HOSPITAL_COMMUNITY): Payer: BLUE CROSS/BLUE SHIELD

## 2014-12-09 ENCOUNTER — Encounter (HOSPITAL_COMMUNITY): Payer: Self-pay | Admitting: Emergency Medicine

## 2014-12-09 ENCOUNTER — Inpatient Hospital Stay (HOSPITAL_COMMUNITY)
Admission: EM | Admit: 2014-12-09 | Discharge: 2014-12-17 | DRG: 374 | Disposition: A | Discharge status: Hospice | Payer: BLUE CROSS/BLUE SHIELD | Attending: Internal Medicine | Admitting: Internal Medicine

## 2014-12-09 DIAGNOSIS — K920 Hematemesis: Secondary | ICD-10-CM | POA: Diagnosis present

## 2014-12-09 DIAGNOSIS — R55 Syncope and collapse: Secondary | ICD-10-CM | POA: Diagnosis present

## 2014-12-09 DIAGNOSIS — C787 Secondary malignant neoplasm of liver and intrahepatic bile duct: Secondary | ICD-10-CM | POA: Diagnosis present

## 2014-12-09 DIAGNOSIS — L409 Psoriasis, unspecified: Secondary | ICD-10-CM | POA: Diagnosis present

## 2014-12-09 DIAGNOSIS — Z79899 Other long term (current) drug therapy: Secondary | ICD-10-CM

## 2014-12-09 DIAGNOSIS — Z515 Encounter for palliative care: Secondary | ICD-10-CM | POA: Diagnosis not present

## 2014-12-09 DIAGNOSIS — Z87891 Personal history of nicotine dependence: Secondary | ICD-10-CM | POA: Diagnosis not present

## 2014-12-09 DIAGNOSIS — M109 Gout, unspecified: Secondary | ICD-10-CM | POA: Diagnosis present

## 2014-12-09 DIAGNOSIS — Z923 Personal history of irradiation: Secondary | ICD-10-CM | POA: Diagnosis not present

## 2014-12-09 DIAGNOSIS — G893 Neoplasm related pain (acute) (chronic): Secondary | ICD-10-CM | POA: Diagnosis present

## 2014-12-09 DIAGNOSIS — R601 Generalized edema: Secondary | ICD-10-CM | POA: Diagnosis not present

## 2014-12-09 DIAGNOSIS — C784 Secondary malignant neoplasm of small intestine: Principal | ICD-10-CM | POA: Diagnosis present

## 2014-12-09 DIAGNOSIS — K922 Gastrointestinal hemorrhage, unspecified: Secondary | ICD-10-CM | POA: Diagnosis present

## 2014-12-09 DIAGNOSIS — D62 Acute posthemorrhagic anemia: Secondary | ICD-10-CM | POA: Diagnosis present

## 2014-12-09 DIAGNOSIS — N400 Enlarged prostate without lower urinary tract symptoms: Secondary | ICD-10-CM | POA: Diagnosis present

## 2014-12-09 DIAGNOSIS — F419 Anxiety disorder, unspecified: Secondary | ICD-10-CM | POA: Diagnosis present

## 2014-12-09 DIAGNOSIS — R042 Hemoptysis: Secondary | ICD-10-CM | POA: Diagnosis present

## 2014-12-09 DIAGNOSIS — K219 Gastro-esophageal reflux disease without esophagitis: Secondary | ICD-10-CM | POA: Diagnosis present

## 2014-12-09 DIAGNOSIS — E43 Unspecified severe protein-calorie malnutrition: Secondary | ICD-10-CM | POA: Diagnosis present

## 2014-12-09 DIAGNOSIS — Z6824 Body mass index (BMI) 24.0-24.9, adult: Secondary | ICD-10-CM | POA: Diagnosis not present

## 2014-12-09 DIAGNOSIS — C250 Malignant neoplasm of head of pancreas: Secondary | ICD-10-CM | POA: Diagnosis present

## 2014-12-09 DIAGNOSIS — Z66 Do not resuscitate: Secondary | ICD-10-CM | POA: Diagnosis present

## 2014-12-09 DIAGNOSIS — I959 Hypotension, unspecified: Secondary | ICD-10-CM | POA: Diagnosis present

## 2014-12-09 DIAGNOSIS — Z9889 Other specified postprocedural states: Secondary | ICD-10-CM

## 2014-12-09 DIAGNOSIS — Z79891 Long term (current) use of opiate analgesic: Secondary | ICD-10-CM | POA: Diagnosis not present

## 2014-12-09 DIAGNOSIS — I10 Essential (primary) hypertension: Secondary | ICD-10-CM | POA: Diagnosis present

## 2014-12-09 LAB — CBC WITH DIFFERENTIAL/PLATELET
BASOS PCT: 0 % (ref 0–1)
Basophils Absolute: 0 10*3/uL (ref 0.0–0.1)
EOS PCT: 0 % (ref 0–5)
Eosinophils Absolute: 0 10*3/uL (ref 0.0–0.7)
HCT: 17.6 % — ABNORMAL LOW (ref 39.0–52.0)
Hemoglobin: 5.5 g/dL — CL (ref 13.0–17.0)
LYMPHS PCT: 3 % — AB (ref 12–46)
Lymphs Abs: 0.7 10*3/uL (ref 0.7–4.0)
MCH: 28.2 pg (ref 26.0–34.0)
MCHC: 31.3 g/dL (ref 30.0–36.0)
MCV: 90.3 fL (ref 78.0–100.0)
MONO ABS: 1.3 10*3/uL — AB (ref 0.1–1.0)
MONOS PCT: 7 % (ref 3–12)
NEUTROS ABS: 18.2 10*3/uL — AB (ref 1.7–7.7)
Neutrophils Relative %: 90 % — ABNORMAL HIGH (ref 43–77)
Platelets: 494 10*3/uL — ABNORMAL HIGH (ref 150–400)
RBC: 1.95 MIL/uL — ABNORMAL LOW (ref 4.22–5.81)
RDW: 15.8 % — ABNORMAL HIGH (ref 11.5–15.5)
WBC: 20.2 10*3/uL — ABNORMAL HIGH (ref 4.0–10.5)

## 2014-12-09 LAB — COMPREHENSIVE METABOLIC PANEL
ALBUMIN: 1.9 g/dL — AB (ref 3.5–5.2)
ALT: 9 U/L (ref 0–53)
ANION GAP: 9 (ref 5–15)
AST: 24 U/L (ref 0–37)
Alkaline Phosphatase: 58 U/L (ref 39–117)
BUN: 10 mg/dL (ref 6–23)
CHLORIDE: 106 meq/L (ref 96–112)
CO2: 21 mmol/L (ref 19–32)
CREATININE: 0.65 mg/dL (ref 0.50–1.35)
Calcium: 7.1 mg/dL — ABNORMAL LOW (ref 8.4–10.5)
GFR calc Af Amer: >90 mL/min (ref >90)
GFR calc non Af Amer: >90 mL/min (ref >90)
Glucose, Bld: 176 mg/dL — ABNORMAL HIGH (ref 70–99)
POTASSIUM: 4.4 mmol/L (ref 3.5–5.1)
SODIUM: 136 mmol/L (ref 135–145)
Total Bilirubin: 0.5 mg/dL (ref 0.3–1.2)
Total Protein: 4.5 g/dL — ABNORMAL LOW (ref 6.0–8.3)

## 2014-12-09 LAB — PREPARE RBC (CROSSMATCH)

## 2014-12-09 LAB — POC OCCULT BLOOD, ED: Fecal Occult Bld: POSITIVE — AB

## 2014-12-09 LAB — LIPASE, BLOOD: LIPASE: 63 U/L — AB (ref 11–59)

## 2014-12-09 LAB — I-STAT CG4 LACTIC ACID, ED: LACTIC ACID, VENOUS: 3.52 mmol/L — AB (ref 0.5–2.2)

## 2014-12-09 LAB — PROTIME-INR
INR: 1.37 (ref 0.00–1.49)
PROTHROMBIN TIME: 17 s — AB (ref 11.6–15.2)

## 2014-12-09 MED ORDER — SODIUM CHLORIDE 0.9 % IV SOLN
INTRAVENOUS | Status: DC
  Administered 2014-12-09 – 2014-12-13 (×6): via INTRAVENOUS
  Administered 2014-12-14: 1000 mL via INTRAVENOUS
  Administered 2014-12-16: 21:00:00 via INTRAVENOUS

## 2014-12-09 MED ORDER — SODIUM CHLORIDE 0.9 % IV SOLN
80.0000 mg | Freq: Once | INTRAVENOUS | Status: AC
  Administered 2014-12-09: 80 mg via INTRAVENOUS
  Filled 2014-12-09: qty 80

## 2014-12-09 MED ORDER — ONDANSETRON HCL 4 MG PO TABS
4.0000 mg | ORAL_TABLET | Freq: Four times a day (QID) | ORAL | Status: DC | PRN
  Filled 2014-12-09: qty 1

## 2014-12-09 MED ORDER — PANTOPRAZOLE SODIUM 40 MG IV SOLR
40.0000 mg | Freq: Two times a day (BID) | INTRAVENOUS | Status: DC
  Administered 2014-12-13 – 2014-12-17 (×9): 40 mg via INTRAVENOUS
  Filled 2014-12-09 (×12): qty 40

## 2014-12-09 MED ORDER — ONDANSETRON HCL 4 MG/2ML IJ SOLN
4.0000 mg | Freq: Once | INTRAMUSCULAR | Status: AC
  Administered 2014-12-09: 4 mg via INTRAVENOUS
  Filled 2014-12-09: qty 2

## 2014-12-09 MED ORDER — MORPHINE SULFATE 2 MG/ML IJ SOLN
2.0000 mg | INTRAMUSCULAR | Status: DC | PRN
  Administered 2014-12-09: 4 mg via INTRAVENOUS
  Filled 2014-12-09 (×2): qty 2

## 2014-12-09 MED ORDER — ONDANSETRON HCL 4 MG/2ML IJ SOLN
4.0000 mg | Freq: Four times a day (QID) | INTRAMUSCULAR | Status: DC | PRN
  Administered 2014-12-10 – 2014-12-17 (×14): 4 mg via INTRAVENOUS
  Filled 2014-12-09 (×14): qty 2

## 2014-12-09 MED ORDER — SODIUM CHLORIDE 0.9 % IV SOLN: Freq: Once | INTRAVENOUS | Status: DC

## 2014-12-09 MED ORDER — SODIUM CHLORIDE 0.9 % IV SOLN
8.0000 mg/h | INTRAVENOUS | Status: AC
  Administered 2014-12-09 – 2014-12-11 (×6): 8 mg/h via INTRAVENOUS
  Filled 2014-12-09 (×16): qty 80

## 2014-12-09 MED ORDER — SODIUM CHLORIDE 0.9 % IV BOLUS (SEPSIS)
1000.0000 mL | Freq: Once | INTRAVENOUS | Status: AC
  Administered 2014-12-09: 1000 mL via INTRAVENOUS

## 2014-12-09 MED ORDER — SODIUM CHLORIDE 0.9 % IV SOLN
Freq: Once | INTRAVENOUS | Status: AC
  Administered 2014-12-09: 19:00:00 via INTRAVENOUS

## 2014-12-09 MED ORDER — SODIUM CHLORIDE 0.9 % IV SOLN
10.0000 mL/h | Freq: Once | INTRAVENOUS | Status: AC
  Administered 2014-12-10: 10 mL/h via INTRAVENOUS

## 2014-12-09 MED ORDER — SODIUM CHLORIDE 0.9 % IV BOLUS (SEPSIS)
125.0000 mL | Freq: Once | INTRAVENOUS | Status: DC
Start: 2014-12-09 — End: 2014-12-09

## 2014-12-09 MED ORDER — SODIUM CHLORIDE 0.9 % IV SOLN
Freq: Once | INTRAVENOUS | Status: AC
Start: 2014-12-09 — End: 2014-12-09
  Administered 2014-12-09: 21:00:00 via INTRAVENOUS

## 2014-12-09 NOTE — ED Notes (Signed)
Unable to obtain blood for labs at this time due to blood transfusing. RN made aware

## 2014-12-09 NOTE — ED Notes (Signed)
Pt from home via [facility]. Per EMS, pt had recent d/c from this facility. EMS reports rectal bleeding and coffee ground emesis that started today. Pt has hx of pancreatic CA that he is currently being tx for. Pt adds that he has weakness. Pt is A&O x4 and in NAD

## 2014-12-09 NOTE — ED Notes (Signed)
Patient has no needs. Pt is resting quietly. He is talking to friends and family at bedside. Appears no acute distress.

## 2014-12-09 NOTE — ED Provider Notes (Signed)
CSN: [id number]     Arrival date & time 12/09/14  64 History   First MD Initiated Contact with Patient 12/09/14 1731     Chief Complaint  Patient presents with  . Hemoptysis  . Rectal Bleeding     (Consider location/radiation/quality/duration/timing/severity/associated sxs/prior Treatment) HPI Patient presents for days after discharge from this facility with concern of ongoing weakness, bloody stool, hematemesis. Patient notes that since discharge he has had one session of radiation therapy, 1 session of chemotherapy. Patient has been eating very small amounts of food. He continues to have bloody stool, and today, just prior to ED arrival had one episode of vomiting, with blood. He denies syncope does acknowledge near syncope. He denies confusion, disorientation, asymmetric weakness, chest pain, dyspnea.   Past Medical History  Diagnosis Date  . Hemorrhoids   . Chronic pancreatitis   . Pancreatic cyst dx'd 08/03/2014  . History of gout   . Anxiety   . 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 HTN (hypertension)   . Wears glasses   . GERD (gastroesophageal reflux disease)    Past Surgical History  Procedure Laterality Date  . Colonoscopy    . Eus N/A 08/03/2014    Procedure: ESOPHAGEAL ENDOSCOPIC ULTRASOUND (EUS) RADIAL;  Surgeon: Arta Silence, MD;  Location: [place];  Service: Endoscopy;  Laterality: N/A;  H&P in file  . Eus N/A 09/04/2014    Procedure: ESOPHAGEAL ENDOSCOPIC ULTRASOUND (EUS) RADIAL;  Surgeon: Arta Silence, MD;  Location: [facility];  Service: Endoscopy;  Laterality: N/A;  . Fine needle aspiration N/A 09/04/2014    Procedure: FINE NEEDLE ASPIRATION (FNA) RADIAL;  Surgeon: Arta Silence, MD;  Location: [facility];  Service: Endoscopy;  Laterality: N/A;  . Portacath placement Left 09/25/2014    Procedure: INSERTION PORT-A-CATH;  Surgeon: Stark Klein, MD;  Location: [place];  Service: General;  Laterality: Left;  .  Esophagogastroduodenoscopy N/A 11/28/2014    Procedure: ESOPHAGOGASTRODUODENOSCOPY (EGD);  Surgeon: Wonda Horner, MD;  Location: [facility];  Service: Endoscopy;  Laterality: N/A;   No family history on file. History  Substance Use Topics  . Smoking status: Former Smoker -- 0.50 packs/day for 30 years    Types: Cigarettes    Quit date: 09/21/2011  . Smokeless tobacco: Never Used     Comment: "quit smoking  <2014"  . Alcohol Use: Yes     Comment: 08/31/2014 "stopped drinking 07/24/2014"    Review of Systems  Constitutional:       Per HPI, otherwise negative  HENT:       Per HPI, otherwise negative  Respiratory:       Per HPI, otherwise negative  Cardiovascular:       Per HPI, otherwise negative  Gastrointestinal: Positive for nausea, vomiting, abdominal pain, blood in stool and abdominal distention.  Endocrine:       Negative aside from HPI  Genitourinary:       Neg aside from HPI   Musculoskeletal:       Per HPI, otherwise negative  Skin: Positive for pallor.  Neurological: Positive for weakness and light-headedness. Negative for syncope.      Allergies  Review of patient's allergies indicates no known allergies.  Home Medications   Prior to Admission medications   Medication Sig Start Date End Date Taking? Authorizing Provider  ALPRAZolam Duanne Moron) 1 MG tablet Take 1 tablet (1 mg total) by mouth at bedtime as needed for anxiety. 10/15/14   Ladell Pier, MD  capecitabine (  XELODA) 500 MG tablet Take 3 tabs = 1500 mg in AM; Take 3 tabs = 1500 mg in PM.  Total daily dose of 3000 mg.  TAKE ON DAYS OF RADIATION ONLY Starts 12/06/14 12/05/14   Ladell Pier, MD  hyaluronate sodium (RADIAPLEXRX) GEL Apply 1 application topically daily. Apply to affected site on abdomen daily after rad txs and prn 12/07/14   Jodelle Gross, MD  HYDROmorphone (DILAUDID) 8 MG tablet Take 1-1.5 tablets (8-12 mg total) by mouth every 4 (four) hours as needed for moderate pain or severe pain. 12/05/14    Barton Dubois, MD  indomethacin (INDOCIN) 50 MG capsule Take 1 capsule (50 mg total) by mouth daily. 12/07/14   Ladell Pier, MD  lactose free nutrition (BOOST) LIQD Take 237 mLs by mouth 4 (four) times daily.    Historical Provider, MD  lidocaine-prilocaine (EMLA) cream Apply 1 application topically as needed. Apply to [facility] site 1-2 hours prior to stick and cover with plastic wrap 09/21/14   Ladell Pier, MD  morphine (MS CONTIN) 100 MG 12 hr tablet Take 1 tablet (100 mg total) by mouth every 12 (twelve) hours. 12/05/14   Barton Dubois, MD  pantoprazole (PROTONIX) 40 MG tablet Take 1 tablet (40 mg total) by mouth daily. 12/05/14   Barton Dubois, MD  prochlorperazine (COMPAZINE) 10 MG tablet Take 1 tablet (10 mg total) by mouth every 6 (six) hours as needed for nausea. 09/21/14   Ladell Pier, MD  tamsulosin (FLOMAX) 0.4 MG CAPS capsule Take 1 capsule (0.4 mg total) by mouth daily. 11/26/14   Ladell Pier, MD   BP 110/60 mmHg  Pulse 155  Temp(Src) 98.5 F (36.9 C) (Rectal)  Resp 20  SpO2 93% Physical Exam  Constitutional: He is oriented to person, place, and time. He has a sickly appearance. He appears distressed.  HENT:  Head: Normocephalic and atraumatic.  Eyes: Conjunctivae and EOM are normal.  Cardiovascular: Regular rhythm and intact distal pulses.  Tachycardia present.   Pulmonary/Chest: No stridor. Tachypnea noted. He has decreased breath sounds. He has no wheezes.  Abdominal: He exhibits no distension.    Musculoskeletal: He exhibits no edema.  Neurological: He is alert and oriented to person, place, and time.  Skin: Skin is warm and dry. There is pallor.  Psychiatric: His mood appears anxious.  Nursing note and vitals reviewed.   ED Course  Procedures (including critical care time) Labs Review Labs Reviewed  COMPREHENSIVE METABOLIC PANEL - Abnormal; Notable for the following:    Glucose, Bld 176 (*)    Calcium 7.1 (*)    Total Protein 4.5 (*)    Albumin 1.9 (*)     All other components within normal limits  LIPASE, BLOOD - Abnormal; Notable for the following:    Lipase 63 (*)    All other components within normal limits  CBC WITH DIFFERENTIAL - Abnormal; Notable for the following:    WBC 20.2 (*)    RBC 1.95 (*)    Hemoglobin 5.5 (*)    HCT 17.6 (*)    RDW 15.8 (*)    Platelets 494 (*)    Neutrophils Relative % 90 (*)    Neutro Abs 18.2 (*)    Lymphocytes Relative 3 (*)    Monocytes Absolute 1.3 (*)    All other components within normal limits  PROTIME-INR - Abnormal; Notable for the following:    Prothrombin Time 17.0 (*)    All other components within normal  limits  I-STAT CG4 LACTIC ACID, ED - Abnormal; Notable for the following:    Lactic Acid, Venous 3.52 (*)    All other components within normal limits  POC OCCULT BLOOD, ED - Abnormal; Notable for the following:    Fecal Occult Bld POSITIVE (*)    All other components within normal limits  TYPE AND SCREEN  PREPARE RBC (CROSSMATCH)    Imaging Review Dg Chest Port 1 View  12/09/2014   CLINICAL DATA:  Hypotension, hypoxia, shortness of breath, weakness, rectal bleeding, coffee ground emesis, symptoms began today, history pancreatic cancer, hypertension, former smoker  EXAM: PORTABLE CHEST - 1 VIEW  COMPARISON:  Portable exam 6301 hr compared to 11/28/2014  FINDINGS: LEFT subclavian Port-A-Cath with tip projecting over SVC.  Normal heart size, mediastinal contours, and pulmonary vascularity.  Lungs clear.  No pleural effusion or pneumothorax.  Bones unremarkable.  IMPRESSION: No acute abnormalities.   Electronically Signed   By: Lavonia Dana M.D.   On: 12/09/2014 17:53     EKG Interpretation   Date/Time:  Sunday December 09 2014 17:29:54 EST Ventricular Rate:  151 PR Interval:  91 QRS Duration: 76 QT Interval:  278 QTC Calculation: 441 R Axis:   74 Text Interpretation:  Sinus tachycardia Ventricular premature complex Low  voltage, extremity and precordial leads Sinus tachycardia  Artifact  Abnormal ekg Confirmed by Carmin Muskrat  MD [phone]) on 12/09/2014 5:32:18  PM     Patient received empiric IV fluids after my initial evaluation.  I reviewed the patient's chart, including recent discharge summary, notation from interventional radiology note of treatment of SMA.  I also discussed the CT findings with the patient and his family.  On repeat exam the patient is less tachycardic, remains generally uncomfortable appearing. I had a lengthy conversation with multiple friends about all findings, the patient about all findings.  I discussed patient's case with his gastroenterologist. Patient had recent endoscopy, and there is no indication for emergent endoscopy with this current evaluation, though they will follow the patient's case after admission.    MDM  Patient with metastatic pancreas cancer presents with ongoing GI bleed, weakness.  Patient is initially tachycardic, tachypneic, uncomfortable appearing.  Patient has evidence for progression of disease, both clinically, and with CT scan performed within the past week. However, the patient is awake, alert, answering all questions appropriately, in spite of his ongoing blood loss. Patient was started with transfusion, admitted to a step down unit for further evaluation and management.  CRITICAL CARE Performed by: Carmin Muskrat Total critical care time: 45 Critical care time was exclusive of separately billable procedures and treating other patients. Critical care was necessary to treat or prevent imminent or life-threatening deterioration. Critical care was time spent personally by me on the following activities: development of treatment plan with patient and/or surrogate as well as nursing, discussions with consultants, evaluation of patient's response to treatment, examination of patient, obtaining history from patient or surrogate, ordering and performing treatments and interventions, ordering and review of  laboratory studies, ordering and review of radiographic studies, pulse oximetry and re-evaluation of patient's condition.   Carmin Muskrat, MD 12/09/14 [phone]

## 2014-12-09 NOTE — H&P (Addendum)
Hospitalist Admission History and Physical  Patient name: Richard Cantu record number: [id number] Date of birth: [birth year] Age: 59 y.o. Gender: male  Primary Care Provider: Ned Card, NP  Chief Complaint: GIB, hemoptysis, acute blood loss anemia  Gastroenterology: Richard Cantu  History of Present Illness:This is a 59 y.o. year old male with significant past medical history of pancreatic cancer on radiation and chemotherapy-noted necrotic pancreatic mass w/ invasion of 3rd part of duodenum, recent admission for GIB bleeding w/ noted friable mass in duodenum and emergent visceral arteriogram w/ multivessel embolization by IR 1/8 presenting with hemoptysis, GIB. Pt noted to have been discharged from the hospital on 1/13 for acute blood loss anemia secodnary to GI bleed in setting of invasive pancreatic cancer into 3rd portion of the duodenum. Pt also w/ emergent visceral arteriogram w/ multi-vessel embolization by IR 1/8. Pt received a total of 8 units of pRBCs during hospitalization 1/6-1/13. Discharge hgb 8.  Pt states that he has had progressive decline since leaving the hospital w/ intermittent episodes of coffee ground emesis and tarry stools. Episodes have progressively worsened since leaving the hospital. Was discharged on PPI. States that he has been compliant with this since leaving the hospital up until last night. Denies any ASA or NSAID use. No strenuous movement. States that he has been having his hgb checked serially. Has been 7s-8s per pt.  Woke up this am weak and dizzy. No abd pain. No fever.  Presented to ER T 98.7, HR 100s-150s, resp 10s-20s, BP 80s-110s. Satting 100% on RA. WBC 20.2, hgb 5.5, Cr 0.65. Lactate 3.5. Hemoccult positive.  Case discussed w/ Dr Richard Cantu w/ GI. Acute intervention not indicated. Will see in am.   Assessment and Plan: Richard Cantu is a 59 y.o. year old male presenting with GIB, hemoptysis   Active Problems:   GIB (gastrointestinal  bleeding)   1- GIB, hemoptysis  -Active bleed w/ symptomatic anemia in setting of invasive pancreatic cancer  -transfuse pRBCs -IV PPI  -stepdown unit -trend lactate  -IVFs  -follow  -f/u GI recs in am   2-Acute blood loss anemia -hgb 5.5 on admission -overall goal hgb >7  -treat as above   3- Pancreatic Cancer -invasive disease w/ invasion into 3rd part of duodenum based on imaging  -currently on palliative radiation and chemotherapy per med-onc note 12/05/14 -med-onc, +/- rad-onc c/s in am    FEN/GI: NPO for now  Prophylaxis: SCDs  Disposition: pending further evaluation  Code Status:Full Code (may need discussion in conjunction w/ oncology about long term goals of care)   Patient Active Problem List   Diagnosis Date Noted  . GI bleeding   . Esophageal reflux   . BPH (benign prostatic hyperplasia)   . Bleeding   . GIB (gastrointestinal bleeding) 11/28/2014  . Protein-calorie malnutrition, severe 11/28/2014  . Severe malnutrition 11/28/2014  . Cancer of head of pancreas 09/17/2014  . Acute recurrent pancreatitis 08/30/2014  . Hypertension 08/30/2014  . Abdominal pain 08/30/2014  . Pancreatic pseudocyst 08/30/2014  . Pseudocyst of pancreas 08/30/2014   Past Medical History: Past Medical History  Diagnosis Date  . Hemorrhoids   . Chronic pancreatitis   . Pancreatic cyst dx'd 08/03/2014  . History of gout   . Anxiety   . 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 HTN (hypertension)   . Wears glasses   . GERD (gastroesophageal reflux disease)     Past Surgical History: Past Surgical History  Procedure Laterality Date  .  Colonoscopy    . Eus N/A 08/03/2014    Procedure: ESOPHAGEAL ENDOSCOPIC ULTRASOUND (EUS) RADIAL;  Surgeon: Richard Silence, MD;  Location: [place];  Service: Endoscopy;  Laterality: N/A;  H&P in file  . Eus N/A 09/04/2014    Procedure: ESOPHAGEAL ENDOSCOPIC ULTRASOUND (EUS) RADIAL;  Surgeon: Richard Silence, MD;  [facility];  Service: Endoscopy;  Laterality: N/A;  . Fine needle aspiration N/A 09/04/2014    Procedure: FINE NEEDLE ASPIRATION (FNA) RADIAL;  Surgeon: Richard Silence, MD;  Location: [facility];  Service: Endoscopy;  Laterality: N/A;  . Portacath placement Left 09/25/2014    Procedure: INSERTION PORT-A-CATH;  Surgeon: Richard Klein, MD;  Location: [place];  Service: General;  Laterality: Left;  . Esophagogastroduodenoscopy N/A 11/28/2014    Procedure: ESOPHAGOGASTRODUODENOSCOPY (EGD);  Surgeon: Wonda Horner, MD;  Location: [facility];  Service: Endoscopy;  Laterality: N/A;    Social History: History   Social History  . Marital Status: Divorced    Spouse Name: N/A    Number of Children: N/A  . Years of Education: N/A   Social History Main Topics  . Smoking status: Former Smoker -- 0.50 packs/day for 30 years    Types: Cigarettes    Quit date: 09/21/2011  . Smokeless tobacco: Never Used     Comment: "quit smoking  <2014"  . Alcohol Use: Yes     Comment: 08/31/2014 "stopped drinking 07/24/2014"  . Drug Use: No  . Sexual Activity: None     Comment: 08/02/14--quit a couple years ago   Other Topics Concern  . 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  Allergies: No Known Allergies  Current Facility-Administered Medications  Medication Dose Route Frequency Provider Last Rate Last Dose  . 0.9 %  sodium chloride infusion  10 mL/hr Intravenous Once Carmin Muskrat, MD      . 0.9 %  sodium chloride infusion   Intravenous Continuous Shanda Howells, MD      . 0.9 %  sodium chloride infusion   Intravenous Once Shanda Howells, MD      . 0.9 %  sodium chloride infusion   Intravenous Once Shanda Howells, MD      . ondansetron [facility]) tablet  4 mg  4 mg Oral Q6H PRN Shanda Howells, MD       Or  . ondansetron [facility]) injection 4 mg  4 mg Intravenous Q6H PRN Shanda Howells, MD      . pantoprazole (PROTONIX) 80 mg in sodium chloride 0.9 % 100 mL IVPB  80 mg Intravenous Once Shanda Howells, MD      . pantoprazole (PROTONIX) 80 mg in sodium chloride 0.9 % 250 mL (0.32 mg/mL) infusion  8 mg/hr Intravenous Continuous Shanda Howells, MD      . Derrill Memo ON 12/13/2014] pantoprazole (PROTONIX) injection 40 mg  40 mg Intravenous Q12H Shanda Howells, MD       Current Outpatient Prescriptions  Medication Sig Dispense Refill  . ALPRAZolam (XANAX) 1 MG tablet Take 1 tablet (1 mg total) by mouth at bedtime as needed for anxiety. 30 tablet 0  . capecitabine (XELODA) 500 MG tablet Take 3 tabs = 1500 mg in AM; Take 3 tabs =  1500 mg in PM.  Total daily dose of 3000 mg.  TAKE ON DAYS OF RADIATION ONLY Starts 12/06/14 168 tablet 0  . hyaluronate sodium (RADIAPLEXRX) GEL Apply 1 application topically daily. Apply to affected site on abdomen daily after rad txs and prn    . HYDROmorphone (DILAUDID) 8 MG tablet Take 1-1.5 tablets (8-12 mg total) by mouth every 4 (four) hours as needed for moderate pain or severe pain. 30 tablet 0  . indomethacin (INDOCIN) 50 MG capsule Take 1 capsule (50 mg total) by mouth daily. 5 capsule 0  . lactose free nutrition (BOOST) LIQD Take 237 mLs by mouth 4 (four) times daily.    Marland Kitchen lidocaine-prilocaine (EMLA) cream Apply 1 application topically as needed. Apply to [facility] site 1-2 hours prior to stick and cover with plastic wrap 30 g 11  . morphine (MS CONTIN) 100 MG 12 hr tablet Take 1 tablet (100 mg total) by mouth every 12 (twelve) hours. 30 tablet 0  . pantoprazole (PROTONIX) 40 MG tablet Take 1 tablet (40 mg total) by mouth daily. 30 tablet 1  . PRESCRIPTION MEDICATION Radiation/Chemo at [facility]    . prochlorperazine (COMPAZINE) 10 MG tablet Take 1 tablet (10 mg total) by mouth every 6 (six) hours as needed for nausea. 60 tablet 1  .  tamsulosin (FLOMAX) 0.4 MG CAPS capsule Take 1 capsule (0.4 mg total) by mouth daily. 30 capsule 1   Review Of Systems: 12 point ROS negative except as noted above in HPI.  Physical Exam: Filed Vitals:   12/09/14 2001  BP: 106/66  Pulse: 113  Temp: 98.7 F (37.1 C)  Resp: 15    General: alert, cooperative and cachectic HEENT: PERRLA and dry oral mucosa  Heart: S1, S2 normal, no murmur, rub or gallop, regular rate and rhythm Lungs: clear to auscultation, no wheezes or rales and unlabored breathing Abdomen: abdomen is soft without significant tenderness, masses, organomegaly or guarding Extremities: extremities normal, atraumatic, no cyanosis or edema Skin:no rashes, no ecchymoses Neurology: normal without focal findings  Labs and Imaging: Lab Results  Component Value Date/Time   NA 136 12/09/2014 05:43 PM   NA 139 11/26/2014 01:24 PM   K 4.4 12/09/2014 05:43 PM   K 4.5 11/26/2014 01:24 PM   CL 106 12/09/2014 05:43 PM   CO2 21 12/09/2014 05:43 PM   CO2 26 11/26/2014 01:24 PM   BUN 10 12/09/2014 05:43 PM   BUN 13.4 11/26/2014 01:24 PM   CREATININE 0.65 12/09/2014 05:43 PM   CREATININE 0.8 11/26/2014 01:24 PM   GLUCOSE 176* 12/09/2014 05:43 PM   GLUCOSE 122 11/26/2014 01:24 PM   Lab Results  Component Value Date   WBC 20.2* 12/09/2014   HGB 5.5* 12/09/2014   HCT 17.6* 12/09/2014   MCV 90.3 12/09/2014   PLT 494* 12/09/2014    Dg Chest Port 1 View  12/09/2014   CLINICAL DATA:  Hypotension, hypoxia, shortness of breath, weakness, rectal bleeding, coffee ground emesis, symptoms began today, history pancreatic cancer, hypertension, former smoker  EXAM: PORTABLE CHEST - 1 VIEW  COMPARISON:  Portable exam 1748 hr compared to 11/28/2014  FINDINGS: LEFT subclavian Port-A-Cath with tip projecting over SVC.  Normal heart size, mediastinal contours, and pulmonary vascularity.  Lungs clear.  No pleural effusion or pneumothorax.  Bones unremarkable.  IMPRESSION: No acute  abnormalities.   Electronically Signed   By: Lavonia Dana M.D.   On: 12/09/2014 17:53           Shanda Howells  MD  Pager: [phone]

## 2014-12-09 NOTE — ED Notes (Signed)
No reaction noted at this time. Pt resting and talking with friend at bedside. Appears in no acute distress.

## 2014-12-09 NOTE — ED Notes (Signed)
Bed: RESA Expected date: 12/09/14 Expected time: 5:18 PM Means of arrival:  Comments: EMS-rectal bleed

## 2014-12-09 NOTE — Consult Note (Signed)
Referring Provider: Dr. Vanita Panda Primary Care Physician:  Ned Card, NP   Reason for Consultation:  GI bleed  HPI: Richard Cantu is a 59 y.o. male with metastatic pancreatic cancer (on chemotherapy and radiation), who was in the hospital last week for a GI bleed and had multivessel embolization last week for a friable mass in the duodenum. Patient reports having 3 coffee grounds emesis episodes since discharge and mild abdominal pain. Has been having melena as well. Woke up this morning very weak and dizzy and could not get out of bed. Hgb 5.5 (8.1 on 12/05/14). WBC 20 (10 on 12/05/14). Denies any NSAIDs.   Past Medical History  Diagnosis Date  . Hemorrhoids   . Chronic pancreatitis   . Pancreatic cyst dx'd 08/03/2014  . History of gout   . Anxiety   . 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 HTN (hypertension)   . Wears glasses   . GERD (gastroesophageal reflux disease)     Past Surgical History  Procedure Laterality Date  . Colonoscopy    . Eus N/A 08/03/2014    Procedure: ESOPHAGEAL ENDOSCOPIC ULTRASOUND (EUS) RADIAL;  Surgeon: Arta Silence, MD;  Location: [place];  Service: Endoscopy;  Laterality: N/A;  H&P in file  . Eus N/A 09/04/2014    Procedure: ESOPHAGEAL ENDOSCOPIC ULTRASOUND (EUS) RADIAL;  Surgeon: Arta Silence, MD;  [facility];  Service: Endoscopy;  Laterality: N/A;  . Fine needle aspiration N/A 09/04/2014    Procedure: FINE NEEDLE ASPIRATION (FNA) RADIAL;  Surgeon: Arta Silence, MD;  Location: [facility];  Service: Endoscopy;  Laterality: N/A;  . Portacath placement Left 09/25/2014    Procedure: INSERTION PORT-A-CATH;  Surgeon: Stark Klein, MD;  Location: [place];  Service: General;  Laterality: Left;  . Esophagogastroduodenoscopy N/A 11/28/2014    Procedure: ESOPHAGOGASTRODUODENOSCOPY (EGD);  Surgeon: Wonda Horner, MD;  Location: [facility];  Service: Endoscopy;  Laterality: N/A;    Prior to Admission  medications   Medication Sig Start Date End Date Taking? Authorizing Provider  ALPRAZolam Duanne Moron) 1 MG tablet Take 1 tablet (1 mg total) by mouth at bedtime as needed for anxiety. 10/15/14  Yes Ladell Pier, MD  capecitabine (XELODA) 500 MG tablet Take 3 tabs = 1500 mg in AM; Take 3 tabs = 1500 mg in PM.  Total daily dose of 3000 mg.  TAKE ON DAYS OF RADIATION ONLY Starts 12/06/14 12/05/14  Yes Ladell Pier, MD  hyaluronate sodium (RADIAPLEXRX) GEL Apply 1 application topically daily. Apply to affected site on abdomen daily after rad txs and prn 12/07/14  Yes Jodelle Gross, MD  HYDROmorphone (DILAUDID) 8 MG tablet Take 1-1.5 tablets (8-12 mg total) by mouth every 4 (four) hours as needed for moderate pain or severe pain. 12/05/14  Yes Barton Dubois, MD  indomethacin (INDOCIN) 50 MG capsule Take 1 capsule (50 mg total) by mouth daily. 12/07/14  Yes Ladell Pier, MD  lactose free nutrition (BOOST) LIQD Take 237 mLs by mouth 4 (four) times daily.   Yes Historical Provider, MD  lidocaine-prilocaine (EMLA) cream Apply 1 application topically as needed. Apply to [facility] site 1-2 hours prior to stick and cover with plastic wrap 09/21/14  Yes Ladell Pier, MD  morphine (MS CONTIN) 100 MG 12 hr tablet Take 1 tablet (100 mg total) by mouth every 12 (twelve) hours. 12/05/14  Yes Barton Dubois, MD  pantoprazole (PROTONIX) 40 MG tablet Take 1 tablet (40 mg total) by mouth daily.  12/05/14  Yes Barton Dubois, MD  PRESCRIPTION MEDICATION Radiation/Chemo at [facility]   Yes Historical Provider, MD  prochlorperazine (COMPAZINE) 10 MG tablet Take 1 tablet (10 mg total) by mouth every 6 (six) hours as needed for nausea. 09/21/14  Yes Ladell Pier, MD  tamsulosin (FLOMAX) 0.4 MG CAPS capsule Take 1 capsule (0.4 mg total) by mouth daily. 11/26/14  Yes Ladell Pier, MD    Scheduled Meds: . sodium chloride  10 mL/hr Intravenous Once  . sodium chloride   Intravenous Once  . [START ON 12/13/2014] pantoprazole (PROTONIX) IV   40 mg Intravenous Q12H   Continuous Infusions: . sodium chloride 125 mL/hr at 12/09/14 2045  . pantoprozole (PROTONIX) infusion 8 mg/hr (12/09/14 2103)   PRN Meds:.ondansetron **OR** ondansetron (ZOFRAN) IV  Allergies as of 12/09/2014  . (No Known Allergies)    No family history on file.  History   Social History  . Marital Status: Divorced    Spouse Name: N/A    Number of Children: N/A  . Years of Education: N/A   Occupational History  . Not on file.   Social History Main Topics  . Smoking status: Former Smoker -- 0.50 packs/day for 30 years    Types: Cigarettes    Quit date: 09/21/2011  . Smokeless tobacco: Never Used     Comment: "quit smoking  <2014"  . Alcohol Use: Yes     Comment: 08/31/2014 "stopped drinking 07/24/2014"  . Drug Use: No  . Sexual Activity: Not on file     Comment: 08/02/14--quit a couple years ago   Other Topics Concern  . 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  Physical Exam: Vital signs: Filed Vitals:   12/09/14 2100  BP: 106/70  Pulse: 103  Temp: 98.7  Resp: 11     General:   Lethargic, thin, pleasant and cooperative in NAD HEENT: anicteric Lungs:  Clear throughout to auscultation.   No wheezes, crackles, or rhonchi. No acute distress. Heart:  Regular rate and rhythm; no murmurs, clicks, rubs,  or gallops. Abdomen: mild tenderness in epigastric and RUQ with guarding, soft, nondistended, +BS  Rectal:  Deferred Ext: no edema Skin: pale  GI:  Lab Results:  Recent Labs  12/09/14 1743  WBC 20.2*  HGB 5.5*  HCT 17.6*  PLT 494*   BMET  Recent Labs  12/09/14 1743  NA 136  K 4.4  CL 106  CO2 21  GLUCOSE 176*  BUN 10  CREATININE 0.65  CALCIUM 7.1*    LFT  Recent Labs  12/09/14 1743  PROT 4.5*  ALBUMIN 1.9*  AST 24  ALT 9  ALKPHOS 58  BILITOT 0.5   PT/INR  Recent Labs  12/09/14 1743  LABPROT 17.0*  INR 1.37     Studies/Results: Dg Chest Port 1 View  12/09/2014   CLINICAL DATA:  Hypotension, hypoxia, shortness of breath, weakness, rectal bleeding, coffee ground emesis, symptoms began today, history pancreatic cancer, hypertension, former smoker  EXAM: PORTABLE CHEST - 1 VIEW  COMPARISON:  Portable exam 1748 hr compared to 11/28/2014  FINDINGS: LEFT subclavian Port-A-Cath with tip projecting over SVC.  Normal heart size, mediastinal  contours, and pulmonary vascularity.  Lungs clear.  No pleural effusion or pneumothorax.  Bones unremarkable.  IMPRESSION: No acute abnormalities.   Electronically Signed   By: Lavonia Dana M.D.   On: 12/09/2014 17:53    Impression/Plan: 59 yo with metastatic pancreatic cancer and duodenal invasion that bled last week and is likely the source of bleeding now. No role for endoscopy for this friable mass. On palliative chemo/radiation, which could be contributing to the bleeding. Doubt that repeat embolization will be helpful. Agree with transfusion and supportive care. Would recommend palliative medicine consultation. Dr. Watt Climes or Penelope Coop to follow up tomorrow.    LOS: 0 days   Neahkahnie C.  12/09/2014, 9:34 PM

## 2014-12-10 ENCOUNTER — Ambulatory Visit: Payer: BLUE CROSS/BLUE SHIELD

## 2014-12-10 ENCOUNTER — Telehealth: Payer: Self-pay | Admitting: *Deleted

## 2014-12-10 ENCOUNTER — Ambulatory Visit: Payer: BLUE CROSS/BLUE SHIELD | Admitting: Radiation Oncology

## 2014-12-10 DIAGNOSIS — E43 Unspecified severe protein-calorie malnutrition: Secondary | ICD-10-CM

## 2014-12-10 DIAGNOSIS — K219 Gastro-esophageal reflux disease without esophagitis: Secondary | ICD-10-CM

## 2014-12-10 DIAGNOSIS — G893 Neoplasm related pain (acute) (chronic): Secondary | ICD-10-CM | POA: Diagnosis present

## 2014-12-10 DIAGNOSIS — D500 Iron deficiency anemia secondary to blood loss (chronic): Secondary | ICD-10-CM

## 2014-12-10 DIAGNOSIS — K922 Gastrointestinal hemorrhage, unspecified: Secondary | ICD-10-CM | POA: Diagnosis present

## 2014-12-10 DIAGNOSIS — C250 Malignant neoplasm of head of pancreas: Secondary | ICD-10-CM

## 2014-12-10 DIAGNOSIS — R630 Anorexia: Secondary | ICD-10-CM

## 2014-12-10 DIAGNOSIS — R112 Nausea with vomiting, unspecified: Secondary | ICD-10-CM

## 2014-12-10 LAB — CBC WITH DIFFERENTIAL/PLATELET
Basophils Absolute: 0 10*3/uL (ref 0.0–0.1)
Basophils Relative: 0 % (ref 0–1)
Eosinophils Absolute: 0 10*3/uL (ref 0.0–0.7)
Eosinophils Relative: 0 % (ref 0–5)
HCT: 22 % — ABNORMAL LOW (ref 39.0–52.0)
Hemoglobin: 7.1 g/dL — ABNORMAL LOW (ref 13.0–17.0)
LYMPHS PCT: 8 % — AB (ref 12–46)
Lymphs Abs: 1.1 10*3/uL (ref 0.7–4.0)
MCH: 28.9 pg (ref 26.0–34.0)
MCHC: 32.3 g/dL (ref 30.0–36.0)
MCV: 89.4 fL (ref 78.0–100.0)
Monocytes Absolute: 0.9 10*3/uL (ref 0.1–1.0)
Monocytes Relative: 7 % (ref 3–12)
Neutro Abs: 11.3 10*3/uL — ABNORMAL HIGH (ref 1.7–7.7)
Neutrophils Relative %: 85 % — ABNORMAL HIGH (ref 43–77)
Platelets: 402 10*3/uL — ABNORMAL HIGH (ref 150–400)
RBC: 2.46 MIL/uL — AB (ref 4.22–5.81)
RDW: 14.9 % (ref 11.5–15.5)
WBC: 13.4 10*3/uL — ABNORMAL HIGH (ref 4.0–10.5)

## 2014-12-10 LAB — HEMOGLOBIN AND HEMATOCRIT, BLOOD
HCT: 22.6 % — ABNORMAL LOW (ref 39.0–52.0)
Hemoglobin: 7.6 g/dL — ABNORMAL LOW (ref 13.0–17.0)

## 2014-12-10 LAB — COMPREHENSIVE METABOLIC PANEL
ALBUMIN: 1.8 g/dL — AB (ref 3.5–5.2)
ALT: 11 U/L (ref 0–53)
ANION GAP: 6 (ref 5–15)
AST: 15 U/L (ref 0–37)
Alkaline Phosphatase: 54 U/L (ref 39–117)
BUN: 13 mg/dL (ref 6–23)
CHLORIDE: 109 meq/L (ref 96–112)
CO2: 24 mmol/L (ref 19–32)
Calcium: 7.1 mg/dL — ABNORMAL LOW (ref 8.4–10.5)
Creatinine, Ser: 0.5 mg/dL (ref 0.50–1.35)
GFR calc Af Amer: >90 mL/min (ref >90)
GFR calc non Af Amer: >90 mL/min (ref >90)
GLUCOSE: 98 mg/dL (ref 70–99)
Potassium: 4.1 mmol/L (ref 3.5–5.1)
Sodium: 139 mmol/L (ref 135–145)
Total Bilirubin: 0.6 mg/dL (ref 0.3–1.2)
Total Protein: 4.4 g/dL — ABNORMAL LOW (ref 6.0–8.3)

## 2014-12-10 LAB — LACTIC ACID, PLASMA: LACTIC ACID, VENOUS: 1 mmol/L (ref 0.5–2.2)

## 2014-12-10 LAB — PREPARE RBC (CROSSMATCH)

## 2014-12-10 MED ORDER — SODIUM CHLORIDE 0.9 % IJ SOLN: 10.0000 mL | INTRAMUSCULAR | Status: DC | PRN

## 2014-12-10 MED ORDER — PROMETHAZINE HCL 25 MG/ML IJ SOLN
12.5000 mg | Freq: Once | INTRAMUSCULAR | Status: AC
  Administered 2014-12-10: 12.5 mg via INTRAVENOUS
  Filled 2014-12-10: qty 1

## 2014-12-10 MED ORDER — SODIUM CHLORIDE 0.9 % IV BOLUS (SEPSIS)
1000.0000 mL | Freq: Once | INTRAVENOUS | Status: AC
  Administered 2014-12-10: 1000 mL via INTRAVENOUS

## 2014-12-10 MED ORDER — HYDROMORPHONE HCL 1 MG/ML IJ SOLN
1.0000 mg | INTRAMUSCULAR | Status: DC | PRN
  Administered 2014-12-10 (×4): 2 mg via INTRAVENOUS
  Filled 2014-12-10 (×4): qty 2

## 2014-12-10 MED ORDER — HYDROMORPHONE HCL 1 MG/ML IJ SOLN
1.0000 mg | INTRAMUSCULAR | Status: DC | PRN
  Administered 2014-12-10 – 2014-12-11 (×5): 2 mg via INTRAVENOUS
  Administered 2014-12-11: 1 mg via INTRAVENOUS
  Administered 2014-12-11 (×3): 2 mg via INTRAVENOUS
  Filled 2014-12-10 (×9): qty 2

## 2014-12-10 MED ORDER — CETYLPYRIDINIUM CHLORIDE 0.05 % MT LIQD
7.0000 mL | Freq: Two times a day (BID) | OROMUCOSAL | Status: DC
Start: 2014-12-10 — End: 2014-12-17
  Administered 2014-12-10 – 2014-12-17 (×15): 7 mL via OROMUCOSAL

## 2014-12-10 MED ORDER — SODIUM CHLORIDE 0.9 % IV SOLN
Freq: Once | INTRAVENOUS | Status: AC
Start: 2014-12-10 — End: 2014-12-10
  Administered 2014-12-10: 17:00:00 via INTRAVENOUS

## 2014-12-10 MED ORDER — LORAZEPAM 2 MG/ML IJ SOLN: 1.0000 mg | INTRAMUSCULAR | Status: DC | PRN

## 2014-12-10 MED ORDER — SODIUM CHLORIDE 0.9 % IJ SOLN
10.0000 mL | Freq: Two times a day (BID) | INTRAMUSCULAR | Status: DC
  Administered 2014-12-10 – 2014-12-14 (×7): 10 mL

## 2014-12-10 MED ORDER — MORPHINE SULFATE ER 100 MG PO TBCR
100.0000 mg | EXTENDED_RELEASE_TABLET | Freq: Two times a day (BID) | ORAL | Status: DC
  Administered 2014-12-10 – 2014-12-17 (×15): 100 mg via ORAL
  Filled 2014-12-10 (×15): qty 1

## 2014-12-10 NOTE — Progress Notes (Signed)
IR aware of request for re-evaluation for possible arteriogram and embolization given repeat GI bleed. Patient underwent extensive multi-vessel embolization on 11/30/14 including GDA, pancreaticoduodenal trunk and SMA. Patient's hgb 7.1 today after transfusion, BP and HR stable. Spoke with Dr. Kathlene Cote and Dr. Rockne Menghini, IR will reserve repeat arteriogram if patient's labs and vitals become unstable with active bleeding, we may not be able to offer much more with embolization given extensive embo last week.   Tsosie Billing PA-C Interventional Radiology  12/10/14  9:12 AM

## 2014-12-10 NOTE — Progress Notes (Signed)
INITIAL NUTRITION ASSESSMENT  DOCUMENTATION CODES Per approved criteria  -Severe malnutrition in the context of chronic illness  Pt meets criteria for severe MALNUTRITION in the context of chronic illness as evidenced by 11% weight loss x 3.5 months, severe muscle depletion and PO intake <75% for >1 month.  INTERVENTION: Diet advancement per MD -When diet advanced, recommend Resource Breeze po TID, each supplement provides 250 kcal and 9 grams of protein -Also, HS snack daily RD to continue to monitor  NUTRITION DIAGNOSIS: Inadequate oral intake related to poor appetite as evidenced by PO intake PTA <75%.   Goal: Pt to meet >/= 90% of their estimated nutrition needs   Monitor:  Diet advancement, weight, labs, I/O's  Reason for Assessment: Pt identified as at nutrition risk on the Malnutrition Screen Tool  Admitting Dx: GI hemorrhage  ASSESSMENT: 59 y.o. year old male with significant past medical history of pancreatic cancer on radiation and chemotherapy-noted necrotic pancreatic mass w/ invasion of 3rd part of duodenum, recent admission for GIB bleeding w/ noted friable mass in duodenum and emergent visceral arteriogram w/ multivessel embolization by IR 1/8 presenting with hemoptysis, GIB. Pt states that he has had progressive decline since leaving the hospital w/ intermittent episodes of coffee ground emesis and tarry stools.  Pt followed by [place] RD, last seen 10/31/14. Pt recently assessed by other RDs during previous admission (1/06-1/11). Pt was ordered Boost Plus TID and a daily snack during that time. Pt was diagnosed with severe malnutrition.  Pt currently NPO d/t GI hemorrhage. Pt with recent weight loss (11% weight loss x 3.5 months, significant for time frame).  Pt reports "nonexistent" appetite. Pt reports Boost drinks making him sick to his stomach. RD offered Resource Breeze for pt to try when diet is advanced. Pt agreed to try supplement. Pt would also like  to have peanut butter crackers as a HS snack. RD to order when diet advanced.  Nutrition Focused Physical Exam:  Subcutaneous Fat:  Orbital Region: WNL Upper Arm Region: WNL Thoracic and Lumbar Region: NA  Muscle:  Temple Region: mild depletion Clavicle Bone Region: severe depletion Clavicle and Acromion Bone Region: severe depletion Scapular Bone Region: severe depletion Dorsal Hand: WNL Patellar Region: NA Anterior Thigh Region: NA Posterior Calf Region: NA  Edema: no LE edema  Labs reviewed: WNL  Height: Ht Readings from Last 1 Encounters:  12/09/14 5\' 9"  (1.753 m)    Weight: Wt Readings from Last 1 Encounters:  12/10/14 164 lb 3.9 oz (74.5 kg)    Ideal Body Weight: 160 lb  % Ideal Body Weight: 103%  Wt Readings from Last 10 Encounters:  12/10/14 164 lb 3.9 oz (74.5 kg)  12/02/14 182 lb 12.2 oz (82.9 kg)  11/27/14 163 lb 9.6 oz (74.208 kg)  11/13/14 165 lb 14.4 oz (75.252 kg)  10/31/14 163 lb 9.6 oz (74.208 kg)  10/24/14 161 lb 6.4 oz (73.211 kg)  10/10/14 169 lb 8 oz (76.885 kg)  09/25/14 178 lb (80.74 kg)  09/21/14 179 lb (81.194 kg)  09/17/14 178 lb 3.2 oz (80.831 kg)    Usual Body Weight: 190-200 lb  % Usual Body Weight: 86%  BMI:  Body mass index is 24.24 kg/(m^2).  Estimated Nutritional Needs: Kcal: 6195-0932 Protein: 110-120g Fluid: 2.3L/day  Skin: intact  Diet Order: Diet NPO time specified  EDUCATION NEEDS: -No education needs identified at this time   Intake/Output Summary (Last 24 hours) at 12/10/14 0938 Last data filed at 12/10/14 0800  Gross per  24 hour  Intake   2285 ml  Output    950 ml  Net   1335 ml    Last BM: 1/18  Labs:   Recent Labs Lab 12/04/14 0400 12/09/14 1743 12/10/14 0410  NA 136 136 139  K 3.6 4.4 4.1  CL 100 106 109  CO2 29 21 24   BUN <5* 10 13  CREATININE 0.44* 0.65 0.50  CALCIUM 7.6* 7.1* 7.1*  GLUCOSE 102* 176* 98    CBG (last 3)  No results for input(s): GLUCAP in the last 72  hours.  Scheduled Meds: . sodium chloride  10 mL/hr Intravenous Once  . sodium chloride   Intravenous Once  . antiseptic oral rinse  7 mL Mouth Rinse BID  . morphine  100 mg Oral Q12H  . [START ON 12/13/2014] pantoprazole (PROTONIX) IV  40 mg Intravenous Q12H    Continuous Infusions: . sodium chloride 125 mL/hr at 12/10/14 0300  . pantoprozole (PROTONIX) infusion 8 mg/hr (12/10/14 1950)    Past Medical History  Diagnosis Date  . Hemorrhoids   . Chronic pancreatitis   . Pancreatic cyst dx'd 08/03/2014  . History of gout   . Anxiety   . 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 HTN (hypertension)   . Wears glasses   . GERD (gastroesophageal reflux disease)     Past Surgical History  Procedure Laterality Date  . Colonoscopy    . Eus N/A 08/03/2014    Procedure: ESOPHAGEAL ENDOSCOPIC ULTRASOUND (EUS) RADIAL;  Surgeon: Arta Silence, MD;  Location: [place];  Service: Endoscopy;  Laterality: N/A;  H&P in file  . Eus N/A 09/04/2014    Procedure: ESOPHAGEAL ENDOSCOPIC ULTRASOUND (EUS) RADIAL;  Surgeon: Arta Silence, MD;  [facility];  Service: Endoscopy;  Laterality: N/A;  . Fine needle aspiration N/A 09/04/2014    Procedure: FINE NEEDLE ASPIRATION (FNA) RADIAL;  Surgeon: Arta Silence, MD;  Location[facility];  Service: Endoscopy;  Laterality: N/A;  . Portacath placement Left 09/25/2014    Procedure: INSERTION PORT-A-CATH;  Surgeon: Stark Klein, MD;  Location: [place];  Service: General;  Laterality: Left;  . Esophagogastroduodenoscopy N/A 11/28/2014    Procedure: ESOPHAGOGASTRODUODENOSCOPY (EGD);  Surgeon: Wonda Horner, MD;  Location: [facility];  Service: Endoscopy;  Laterality: N/A;    Clayton Bibles, MS, RD, LDN Pager: [phone] After Hours Pager: [phone]

## 2014-12-10 NOTE — Progress Notes (Signed)
   12/10/14 1615  Vitals  BP (!) 74/59 mmHg  MAP (mmHg) 62  Pulse Rate (!) 120  Dr. Rockne Menghini paged about another episode of progressing hypotension. Patient with no complaints currently but does look pale. Order received for 1L bolus and 2 additional PRBCs.

## 2014-12-10 NOTE — Progress Notes (Signed)
[facility] Progress Note  Subjective: Still having some bleeding. Receiving blood transfusion.  Objective: Vital signs in last 24 hours: Temp:  [98 F (36.7 C)-98.9 F (37.2 C)] 98 F (36.7 C) (01/18 1115) Pulse Rate:  [87-155] 107 (01/18 1144) Resp:  [11-21] 15 (01/18 1144) BP: (70-125)/(52-84) 115/71 mmHg (01/18 1144) SpO2:  [93 %-100 %] 100 % (01/18 1144) Weight:  [74.5 kg (164 lb 3.9 oz)-74.9 kg (165 lb 2 oz)] 74.5 kg (164 lb 3.9 oz) (01/18 0500) Weight change:    PE: Alert Abdomen soft  Lab Results: Results for orders placed or performed during the hospital encounter of 12/09/14 (from the past 24 hour(s))  Comprehensive metabolic panel     Status: Abnormal   Collection Time: 12/09/14  5:43 PM  Result Value Ref Range   Sodium 136 135 - 145 mmol/L   Potassium 4.4 3.5 - 5.1 mmol/L   Chloride 106 96 - 112 mEq/L   CO2 21 19 - 32 mmol/L   Glucose, Bld 176 (H) 70 - 99 mg/dL   BUN 10 6 - 23 mg/dL   Creatinine, Ser 0.65 0.50 - 1.35 mg/dL   Calcium 7.1 (L) 8.4 - 10.5 mg/dL   Total Protein 4.5 (L) 6.0 - 8.3 g/dL   Albumin 1.9 (L) 3.5 - 5.2 g/dL   AST 24 0 - 37 U/L   ALT 9 0 - 53 U/L   Alkaline Phosphatase 58 39 - 117 U/L   Total Bilirubin 0.5 0.3 - 1.2 mg/dL   GFR calc non Af Amer >90 >90 mL/min   GFR calc Af Amer >90 >90 mL/min   Anion gap 9 5 - 15  Lipase, blood     Status: Abnormal   Collection Time: 12/09/14  5:43 PM  Result Value Ref Range   Lipase 63 (H) 11 - 59 U/L  CBC with Differential     Status: Abnormal   Collection Time: 12/09/14  5:43 PM  Result Value Ref Range   WBC 20.2 (H) 4.0 - 10.5 K/uL   RBC 1.95 (L) 4.22 - 5.81 MIL/uL   Hemoglobin 5.5 (LL) 13.0 - 17.0 g/dL   HCT 17.6 (L) 39.0 - 52.0 %   MCV 90.3 78.0 - 100.0 fL   MCH 28.2 26.0 - 34.0 pg   MCHC 31.3 30.0 - 36.0 g/dL   RDW 15.8 (H) 11.5 - 15.5 %   Platelets 494 (H) 150 - 400 K/uL   Neutrophils Relative % 90 (H) 43 - 77 %   Neutro Abs 18.2 (H) 1.7 - 7.7 K/uL   Lymphocytes Relative  3 (L) 12 - 46 %   Lymphs Abs 0.7 0.7 - 4.0 K/uL   Monocytes Relative 7 3 - 12 %   Monocytes Absolute 1.3 (H) 0.1 - 1.0 K/uL   Eosinophils Relative 0 0 - 5 %   Eosinophils Absolute 0.0 0.0 - 0.7 K/uL   Basophils Relative 0 0 - 1 %   Basophils Absolute 0.0 0.0 - 0.1 K/uL  Protime-INR     Status: Abnormal   Collection Time: 12/09/14  5:43 PM  Result Value Ref Range   Prothrombin Time 17.0 (H) 11.6 - 15.2 seconds   INR 1.37 0.00 - 1.49  Type and screen     Status: None (Preliminary result)   Collection Time: 12/09/14  5:44 PM  Result Value Ref Range   ABO/RH(D) A POS    Antibody Screen NEG    Sample Expiration 12/12/2014    Unit Number [id number]  Blood Component Type RED CELLS,LR    Unit division 00    Status of Unit ISSUED,FINAL    Transfusion Status OK TO TRANSFUSE    Crossmatch Result Compatible    Unit Number [id number]    Blood Component Type RED CELLS,LR    Unit division 00    Status of Unit ISSUED,FINAL    Transfusion Status OK TO TRANSFUSE    Crossmatch Result Compatible    Unit Number [id number]    Blood Component Type RBC LR PHER1    Unit division 00    Status of Unit ISSUED    Transfusion Status OK TO TRANSFUSE    Crossmatch Result Compatible    Unit Number [id number]    Blood Component Type RED CELLS,LR    Unit division 00    Status of Unit ISSUED    Transfusion Status OK TO TRANSFUSE    Crossmatch Result Compatible   I-Stat CG4 Lactic Acid, ED     Status: Abnormal   Collection Time: 12/09/14  6:01 PM  Result Value Ref Range   Lactic Acid, Venous 3.52 (H) 0.5 - 2.2 mmol/L  POC occult blood, ED RN will collect     Status: Abnormal   Collection Time: 12/09/14  6:03 PM  Result Value Ref Range   Fecal Occult Bld POSITIVE (A) NEGATIVE  Prepare RBC     Status: None   Collection Time: 12/09/14  6:36 PM  Result Value Ref Range   Order Confirmation ORDER PROCESSED BY BLOOD BANK   Prepare RBC     Status: None   Collection Time: 12/09/14  8:30 PM   Result Value Ref Range   Order Confirmation ORDER PROCESSED BY BLOOD BANK   Comprehensive metabolic panel     Status: Abnormal   Collection Time: 12/10/14  4:10 AM  Result Value Ref Range   Sodium 139 135 - 145 mmol/L   Potassium 4.1 3.5 - 5.1 mmol/L   Chloride 109 96 - 112 mEq/L   CO2 24 19 - 32 mmol/L   Glucose, Bld 98 70 - 99 mg/dL   BUN 13 6 - 23 mg/dL   Creatinine, Ser 0.50 0.50 - 1.35 mg/dL   Calcium 7.1 (L) 8.4 - 10.5 mg/dL   Total Protein 4.4 (L) 6.0 - 8.3 g/dL   Albumin 1.8 (L) 3.5 - 5.2 g/dL   AST 15 0 - 37 U/L   ALT 11 0 - 53 U/L   Alkaline Phosphatase 54 39 - 117 U/L   Total Bilirubin 0.6 0.3 - 1.2 mg/dL   GFR calc non Af Amer >90 >90 mL/min   GFR calc Af Amer >90 >90 mL/min   Anion gap 6 5 - 15  Lactic acid, plasma     Status: None   Collection Time: 12/10/14  4:10 AM  Result Value Ref Range   Lactic Acid, Venous 1.0 0.5 - 2.2 mmol/L  CBC WITH DIFFERENTIAL     Status: Abnormal   Collection Time: 12/10/14  4:10 AM  Result Value Ref Range   WBC 13.4 (H) 4.0 - 10.5 K/uL   RBC 2.46 (L) 4.22 - 5.81 MIL/uL   Hemoglobin 7.1 (L) 13.0 - 17.0 g/dL   HCT 22.0 (L) 39.0 - 52.0 %   MCV 89.4 78.0 - 100.0 fL   MCH 28.9 26.0 - 34.0 pg   MCHC 32.3 30.0 - 36.0 g/dL   RDW 14.9 11.5 - 15.5 %   Platelets 402 (H) 150 - 400 K/uL   Neutrophils Relative %  85 (H) 43 - 77 %   Neutro Abs 11.3 (H) 1.7 - 7.7 K/uL   Lymphocytes Relative 8 (L) 12 - 46 %   Lymphs Abs 1.1 0.7 - 4.0 K/uL   Monocytes Relative 7 3 - 12 %   Monocytes Absolute 0.9 0.1 - 1.0 K/uL   Eosinophils Relative 0 0 - 5 %   Eosinophils Absolute 0.0 0.0 - 0.7 K/uL   Basophils Relative 0 0 - 1 %   Basophils Absolute 0.0 0.0 - 0.1 K/uL    Studies/Results: Dg Chest Port 1 View  12/09/2014   CLINICAL DATA:  Hypotension, hypoxia, shortness of breath, weakness, rectal bleeding, coffee ground emesis, symptoms began today, history pancreatic cancer, hypertension, former smoker  EXAM: PORTABLE CHEST - 1 VIEW  COMPARISON:   Portable exam 1748 hr compared to 11/28/2014  FINDINGS: LEFT subclavian Port-A-Cath with tip projecting over SVC.  Normal heart size, mediastinal contours, and pulmonary vascularity.  Lungs clear.  No pleural effusion or pneumothorax.  Bones unremarkable.  IMPRESSION: No acute abnormalities.   Electronically Signed   By: Lavonia Dana M.D.   On: 12/09/2014 17:53      Assessment: Pancreatic cancer invading into the duodenum and causing bleeding. S/P embolization. Now with repeat bleeding.  Plan: Continue to transfuse as needed. ? Repeat embolization per IR. I doubt that there is anything we can do endoscopically to stop this process. This was a large tumor and was friable.     Wonda Horner 12/10/2014, 12:50 PM  Lab Results  Component Value Date   HGB 7.1* 12/10/2014   HGB 5.5* 12/09/2014   HGB 8.1* 12/05/2014   HGB 10.0* 11/26/2014   HGB 11.0* 11/13/2014   HGB 11.7* 10/31/2014   HCT 22.0* 12/10/2014   HCT 17.6* 12/09/2014   HCT 24.3* 12/05/2014   HCT 30.5* 11/26/2014   HCT 32.9* 11/13/2014   HCT 34.9* 10/31/2014   ALKPHOS 54 12/10/2014   ALKPHOS 58 12/09/2014   ALKPHOS 43 12/02/2014   ALKPHOS 81 11/26/2014   ALKPHOS 86 11/13/2014   ALKPHOS 84 10/31/2014   AST 15 12/10/2014   AST 24 12/09/2014   AST 19 12/02/2014   AST 17 11/26/2014   AST 16 11/13/2014   AST 15 10/31/2014   ALT 11 12/10/2014   ALT 9 12/09/2014   ALT 15 12/02/2014   ALT 23 11/26/2014   ALT 25 11/13/2014   ALT 28 10/31/2014

## 2014-12-10 NOTE — Progress Notes (Addendum)
Dr. Rockne Menghini paged regarding progressing hypotension (70/52) and tachycardia in the 120's. Patient is receiving his first of 2 PRBCs and 1L bolus order received.

## 2014-12-10 NOTE — Progress Notes (Signed)
Per Dr. Rockne Menghini, you can reach her after 7pm at [phone]. Call with any changes.

## 2014-12-10 NOTE — Progress Notes (Signed)
IP PROGRESS NOTE  Subjective:  He began palliative radiation and Xeloda on 12/06/2014. He reports developing nausea on 12/07/2014 and had several episodes of dark emesis 12/08/2014. He was admitted 12/09/2014 after another episode of emesis and syncope. He complained of a gout flare in the right great toe on 12/07/2014 and was prescribed Indocin. He took 1 dose. The gout has improved. He reports having dark stools.   Objective: Vital signs in last 24 hours: Blood pressure 116/75, pulse 110, temperature 98.6 F (37 C), temperature source Oral, resp. rate 12, height 5\' 9"  (1.753 m), weight 164 lb 3.9 oz (74.5 kg), SpO2 100 %.  Intake/Output from previous day: 01/17 0701 - 01/18 0700 In: 2135 [I.V.:1465; Blood:670] Out: 650 [Urine:650]  Physical Exam: HEENT: No thrush or ulcers  Abdomen: Soft, mild tenderness in the mid abdomen, no mass Extremities: No leg edema Skin: Pale Musculoskeletal: Mild erythema/induration at the right first MTP joint   Portacath/PICC-without erythema  Lab Results:  Recent Labs  12/09/14 1743 12/10/14 0410  WBC 20.2* 13.4*  HGB 5.5* 7.1*  HCT 17.6* 22.0*  PLT 494* 402*    BMET  Recent Labs  12/09/14 1743 12/10/14 0410  NA 136 139  K 4.4 4.1  CL 106 109  CO2 21 24  GLUCOSE 176* 98  BUN 10 13  CREATININE 0.65 0.50  CALCIUM 7.1* 7.1*    Studies/Results: Dg Chest Port 1 View  12/09/2014   CLINICAL DATA:  Hypotension, hypoxia, shortness of breath, weakness, rectal bleeding, coffee ground emesis, symptoms began today, history pancreatic cancer, hypertension, former smoker  EXAM: PORTABLE CHEST - 1 VIEW  COMPARISON:  Portable exam 1748 hr compared to 11/28/2014  FINDINGS: LEFT subclavian Port-A-Cath with tip projecting over SVC.  Normal heart size, mediastinal contours, and pulmonary vascularity.  Lungs clear.  No pleural effusion or pneumothorax.  Bones unremarkable.  IMPRESSION: No acute abnormalities.   Electronically Signed   By: Lavonia Dana M.D.   On: 12/09/2014 17:53    Medications: I have reviewed the patient's current medications.  Assessment/Plan:  1. 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  Cycle 3 FOLFIRINOX 10/31/2014.  CA-19-9 improved (33.4) on 11/13/2014.  Cycle 4 FOLFIRINOX 11/13/2014.  CA-19-9 improved (30.5) 11/26/2014.  Restaging CT 11/29/2014 consistent with progression of the primary pancreas tumor with invasion of the duodenum, respiratory lymphadenopathy, and liver metastases 2. Pain secondary to #1  3. Psoriasis   4. Acute nausea and vomiting with cycle 1 FOLFIRINOX-emend and prophylactic Decadron were added with cycle 2, improved.  5. Urinary hesitancy-this is most likely related to narcotic analgesics, chemotherapy, and prostatic hypertrophy. 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 Improved with Flomax.  6. Anorexia/weight loss secondary to #1  7. Admission with hypotension and severe anemia secondary to upper gastrointestinal bleeding on 11/28/2013   Upper endoscopy 11/28/2013 confirmed a friable mass in the duodenum consistent with pancreas cancer invading the duodenum  Embolization of branches of the gastroduodenal and superior mesenteric arteries on 11/30/2014  Palliative radiation/Xeloda started 12/06/2014  Admission 12/09/2014 with recurrent GI bleeding         8. Severe anemia secondary to acute GI blood loss  He is admitted with recurrent GI bleeding. Treatment options are limited. Dr. Rockne Menghini discussed the  case with interventional radiology and gastroenterology and they do not recommend intervention at present.  Recommendations: 1.  Transfusion support 2. Continue MS Contin and Dilaudid for pain 3. I will see him in the a.m. 12/11/2014. I will discuss comfort/hospice care with him if the bleeding does not improve.    LOS: 1 day   Pat Sires  12/10/2014, 2:20 PM

## 2014-12-10 NOTE — Progress Notes (Addendum)
Progress Note   Richard Cantu NUU:[id number] DOB: [birth year] DOA: 12/09/2014 PCP: Ned Card, NP   Brief Narrative:   Richard Cantu is an 59 y.o. 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 He subsequently was discharged on 12/05/14 with initiation of palliative radiation on 12/06/14 as well as further treatment with Xeloda, who presented to the hospital for admission on 12/09/14 with recurrent GI bleeding and hemoptysis. Hemoglobin was 5.5 on admission.  He did receive one dose of Indocin for treatment of gout.  Assessment/Plan:   Principal Problem:   GI hemorrhage from duodenal mass in the setting of metastatic pancreatic cancer  Status post 2 units of PRBCs with a post transfusion hemoglobin of 7.1. Give another 2 units.  Continue IV PPI therapy.  GI consultation performed 12/09/14, no role for repeat EGD with his known friable duodenal mass. Repeat embolization not indicated per IR. Recommendations for transfusion and supportive care.  Dr. Benay Spice notified of the patient's admission. Hold Xeloda.  Hold Indocin.  ADDENDUM: AT 6 PM, the patient began to hemorrhage with hematemesis and hematochezia.  BP was low, but patient was lucid.  Given an additional 2 units of blood and IVF boluses to stabilize BP.  Discussion held with him regarding EOL issues, and he requested DNR status should his condition deteriorate to the point that we are unable to reverse his hemorrhage.    Active Problems:   Cancer related pain  Resume MS Contin.    Protein-calorie malnutrition, severe  Currently nothing by mouth due to GI hemorrhage.    Esophageal reflux  Continue PPI  therapy.    BPH (benign prostatic hyperplasia)  Flomax currently on hold. Monitor for retention.    DVT Prophylaxis  SCDs ordered.  Code Status: Full. Family Communication: Olena Leatherwood at bedside [phone]); Wife at bedside. Disposition Plan: Home when stable.   IV Access:    Port-A-Cath   Procedures and diagnostic studies:   Dg Chest Port 1 View 12/09/2014: No acute abnormalities.     Medical Consultants:    Dr. Wilford Corner, Gastroenterology.  Dr. Julieanne Manson, Oncology.  IR  Anti-Infectives:    None.  Subjective:    Richard Cantu is having some abdominal pain related to stopping his MS Contin.  He had 4 black/maroon stools over night.  No vomiting.    Objective:    Filed Vitals:   12/10/14 0130 12/10/14 0200 12/10/14 0242 12/10/14 0500  BP:  119/80    Pulse:  96 94   Temp: 98.9 F (37.2 C)   98.3 F (36.8 C)  TempSrc:    Oral  Resp:  19 11   Height:      Weight:    74.5 kg (164 lb 3.9 oz)  SpO2:  100% 100%     Intake/Output Summary (Last 24 hours) at 12/10/14 0746 Last data filed at 12/10/14 0300  Gross per 24 hour  Intake   1610 ml  Output    650 ml  Net    960 ml    Exam: Gen:  NAD Cardiovascular:  Tachy, No M/R/G Respiratory:  Lungs course with scattered rhonchi Gastrointestinal:  Abdomen soft, NT/ND, + BS Extremities:  No C/E/C   Data Reviewed:  Labs: Basic Metabolic Panel:  Recent Labs Lab 12/04/14 0400 12/09/14 1743 12/10/14 0410  NA 136 136 139  K 3.6 4.4 4.1  CL 100 106 109  CO2 29 21 24   GLUCOSE 102* 176* 98  BUN <5* 10 13  CREATININE 0.44* 0.65 0.50  CALCIUM 7.6* 7.1* 7.1*   GFR Estimated Creatinine Clearance: 100.6 mL/min (by C-G formula based on Cr of 0.5). Liver Function Tests:  Recent Labs Lab 12/09/14 1743 12/10/14 0410  AST 24 15  ALT 9 11  ALKPHOS 58 54  BILITOT 0.5 0.6  PROT 4.5* 4.4*  ALBUMIN 1.9* 1.8*    Recent Labs Lab 12/09/14 1743  LIPASE 63*   Coagulation  profile  Recent Labs Lab 12/09/14 1743  INR 1.37    CBC:  Recent Labs Lab 12/03/14 1400 12/04/14 0400 12/05/14 0440 12/09/14 1743 12/10/14 0410  WBC  --  10.4 10.2 20.2* 13.4*  NEUTROABS  --  8.1*  --  18.2* 11.3*  HGB 8.9* 8.2* 8.1* 5.5* 7.1*  HCT 26.5* 24.5* 24.3* 17.6* 22.0*  MCV  --  88.8 89.3 90.3 89.4  PLT  --  249 291 494* 402*   Sepsis Labs:  Recent Labs Lab 12/04/14 0400 12/05/14 0440 12/09/14 1743 12/09/14 1801 12/10/14 0410  WBC 10.4 10.2 20.2*  --  13.4*  LATICACIDVEN  --   --   --  3.52* 1.0   Microbiology No results found for this or any previous visit (from the past 240 hour(s)).   Medications:   . sodium chloride  10 mL/hr Intravenous Once  . sodium chloride   Intravenous Once  . antiseptic oral rinse  7 mL Mouth Rinse BID  . [START ON 12/13/2014] pantoprazole (PROTONIX) IV  40 mg Intravenous Q12H   Continuous Infusions: . sodium chloride 125 mL/hr at 12/10/14 0300  . pantoprozole (PROTONIX) infusion 8 mg/hr (12/10/14 0658)    Time spent: 35 minutes.  The patient is medically complex and requires high complexity decision making.    LOS: 1 day   Kaleb Linquist  [facility] Pager [phone]. If unable to reach me by pager, please call my cell phone at [phone].  *Please refer to [email or web address], password [email or web address] to get updated schedule on who will round on this patient, as hospitalists switch teams weekly. If 7PM-7AM, please contact night-coverage at [email or web address], password [email or web address] for any overnight needs.  12/10/2014, 7:46 AM

## 2014-12-10 NOTE — Telephone Encounter (Signed)
Cancel appt pt in hospital

## 2014-12-11 ENCOUNTER — Other Ambulatory Visit: Payer: BC Managed Care – PPO

## 2014-12-11 ENCOUNTER — Ambulatory Visit: Payer: BC Managed Care – PPO | Admitting: Oncology

## 2014-12-11 ENCOUNTER — Ambulatory Visit: Payer: BC Managed Care – PPO

## 2014-12-11 ENCOUNTER — Ambulatory Visit: Payer: BLUE CROSS/BLUE SHIELD

## 2014-12-11 LAB — BASIC METABOLIC PANEL
Anion gap: 4 — ABNORMAL LOW (ref 5–15)
BUN: 16 mg/dL (ref 6–23)
CO2: 21 mmol/L (ref 19–32)
CREATININE: 0.6 mg/dL (ref 0.50–1.35)
Calcium: 6.7 mg/dL — ABNORMAL LOW (ref 8.4–10.5)
Chloride: 114 mEq/L — ABNORMAL HIGH (ref 96–112)
Glucose, Bld: 145 mg/dL — ABNORMAL HIGH (ref 70–99)
Potassium: 4.2 mmol/L (ref 3.5–5.1)
Sodium: 139 mmol/L (ref 135–145)

## 2014-12-11 LAB — HEMOGLOBIN AND HEMATOCRIT, BLOOD
HCT: 29.7 % — ABNORMAL LOW (ref 39.0–52.0)
HEMOGLOBIN: 10.2 g/dL — AB (ref 13.0–17.0)

## 2014-12-11 LAB — CBC WITH DIFFERENTIAL/PLATELET
BASOS ABS: 0 10*3/uL (ref 0.0–0.1)
BASOS PCT: 0 % (ref 0–1)
Eosinophils Absolute: 0 10*3/uL (ref 0.0–0.7)
Eosinophils Relative: 0 % (ref 0–5)
HEMATOCRIT: 26.4 % — AB (ref 39.0–52.0)
Hemoglobin: 9.3 g/dL — ABNORMAL LOW (ref 13.0–17.0)
Lymphocytes Relative: 5 % — ABNORMAL LOW (ref 12–46)
Lymphs Abs: 0.9 10*3/uL (ref 0.7–4.0)
MCH: 31.1 pg (ref 26.0–34.0)
MCHC: 35.2 g/dL (ref 30.0–36.0)
MCV: 88.3 fL (ref 78.0–100.0)
MONOS PCT: 5 % (ref 3–12)
Monocytes Absolute: 0.9 10*3/uL (ref 0.1–1.0)
NEUTROS ABS: 16.1 10*3/uL — AB (ref 1.7–7.7)
NEUTROS PCT: 90 % — AB (ref 43–77)
Platelets: 325 10*3/uL (ref 150–400)
RBC: 2.99 MIL/uL — ABNORMAL LOW (ref 4.22–5.81)
RDW: 14.2 % (ref 11.5–15.5)
WBC: 18 10*3/uL — ABNORMAL HIGH (ref 4.0–10.5)

## 2014-12-11 MED ORDER — BOOST / RESOURCE BREEZE PO LIQD
1.0000 | Freq: Three times a day (TID) | ORAL | Status: DC
  Administered 2014-12-11 – 2014-12-17 (×18): 1 via ORAL

## 2014-12-11 MED ORDER — HYDROMORPHONE HCL 1 MG/ML IJ SOLN
1.0000 mg | INTRAMUSCULAR | Status: DC | PRN
  Administered 2014-12-11 – 2014-12-12 (×9): 2 mg via INTRAVENOUS
  Filled 2014-12-11 (×9): qty 2

## 2014-12-11 MED ORDER — LORAZEPAM 2 MG/ML IJ SOLN
1.0000 mg | INTRAMUSCULAR | Status: DC | PRN
  Administered 2014-12-13 – 2014-12-16 (×14): 1 mg via INTRAVENOUS
  Filled 2014-12-11 (×14): qty 1

## 2014-12-11 NOTE — Progress Notes (Signed)
Patient ID: Richard Cantu, male   DOB: 07-01-1956, 59 y.o.   MRN: [id number] [facility] PROGRESS NOTE  Richard Cantu [id number]:[id number] DOB: 29-Jul-1956 DOA: 12/09/2014 PCP: Ned Card, NP  Brief narrative:    59 y.o. 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 He subsequently was discharged on 12/05/14 with initiation of palliative radiation on 12/06/14 as well as further treatment with Xeloda, who presented to the hospital for admission on 12/09/14 with recurrent GI bleeding and hemoptysis. Hemoglobin was 5.5 on admission. He did receive one dose of Indocin for treatment of gout. At this time, patient does not wish to have any more procedures. He wants to be kept comfortable. Code status: DNR.  Assessment/Plan:    Principal Problem: GI hemorrhage from duodenal mass in the setting of metastatic pancreatic cancer - Status post total of 4  units of PRBCs since admission. - Continue  protonix drip. - GI consultation performed 12/09/14, no role for repeat EGD with his known friable duodenal mass. Repeat embolization not indicated per IR. Recommendations for transfusion and supportive care. - Appreciate Dr. Benay Spice seeing the pt. Hold Xeloda. Hold Indocin. - stable to transfer to med floor, comfort care and supportive care   Active Problems:  Cancer related pain - Continue MS Contin.   Protein-calorie malnutrition, severe - per pt request, full liquid diet    Esophageal reflux - Continue PPI therapy.   BPH (benign prostatic hyperplasia) - Flomax currently on hold. Monitor for retention.   DVT Prophylaxis  SCDs ordered.  Code Status: DNR/DNI Family Communication:  Olena Leatherwood at bedside [phone]); Wife at bedside. Disposition Plan: transfer to med floor, order placed.   IV Access:  Port-A-Cath  Procedures and diagnostic studies:  Dg Chest Port 1 View 12/09/2014: No acute abnormalities.   Medical Consultants:  Dr. Wilford Corner, Gastroenterology Dr. Julieanne Manson, Oncology Interventional radiology  Palliative care   Anti-Infectives:  None.    Leisa Lenz, MD  [facility] Pager [phone]  If 7PM-7AM, please contact night-coverage [email or web address] Password [email or web address] 12/11/2014, 2:28 PM   LOS: 2 days    HPI/Subjective: No acute overnight events.  Objective: Filed Vitals:   12/11/14 0600 12/11/14 0700 12/11/14 0800 12/11/14 1200  BP: 106/70 94/56 114/74 90/60  Pulse: 103 102 105 108  Temp:   97.7 F (36.5 C)   TempSrc:   Oral   Resp: 14 12 21 16   Height:      Weight:      SpO2: 98% 98% 100%     Intake/Output Summary (Last 24 hours) at 12/11/14 1428 Last data filed at 12/11/14 1200  Gross per 24 hour  Intake 5871.4 ml  Output   1475 ml  Net 4396.4 ml    Exam:   General:  Pt is alert, not in acute distress  Cardiovascular: Regular rate and rhythm, S1/S2 (+)  Respiratory:  no wheezing, no crackles, no rhonchi  Abdomen: Soft, non tender, non distended, bowel sounds present  Extremities: No edema, pulses DP and PT palpable bilaterally  Neuro: Grossly nonfocal  Data Reviewed: Basic Metabolic Panel:  Recent Labs Lab 12/09/14 1743 12/10/14 0410 12/11/14 0540  NA 136 139 139  K 4.4 4.1 4.2  CL 106 109 114*  CO2 21 24  21  GLUCOSE 176* 98 145*  BUN 10 13 16   CREATININE 0.65 0.50 0.60  CALCIUM 7.1* 7.1* 6.7*   Liver Function Tests:  Recent Labs Lab 12/09/14 1743 12/10/14 0410  AST 24 15  ALT 9 11  ALKPHOS 58 54  BILITOT 0.5 0.6  PROT 4.5* 4.4*  ALBUMIN 1.9* 1.8*    Recent Labs Lab 12/09/14 1743  LIPASE 63*   No results for input(s): AMMONIA in the last 168  hours. CBC:  Recent Labs Lab 12/05/14 0440 12/09/14 1743 12/10/14 0410 12/10/14 1530 12/10/14 2049 12/11/14 0540  WBC 10.2 20.2* 13.4*  --   --  18.0*  NEUTROABS  --  18.2* 11.3*  --   --  16.1*  HGB 8.1* 5.5* 7.1* 7.6* 10.2* 9.3*  HCT 24.3* 17.6* 22.0* 22.6* 29.7* 26.4*  MCV 89.3 90.3 89.4  --   --  88.3  PLT 291 494* 402*  --   --  325   Cardiac Enzymes: No results for input(s): CKTOTAL, CKMB, CKMBINDEX, TROPONINI in the last 168 hours. BNP: Invalid input(s): POCBNP CBG: No results for input(s): GLUCAP in the last 168 hours.  No results found for this or any previous visit (from the past 240 hour(s)).   Scheduled Meds: . feeding supplement   1 Container Oral TID BM  . morphine  100 mg Oral Q12H  .  (PROTONIX) IV  40 mg Intravenous Q12H   Continuous Infusions: . sodium chloride 125 mL/hr at 12/11/14 0656  . pantoprozole (PROTONIX) infusion 8 mg/hr (12/11/14 1407)

## 2014-12-11 NOTE — Progress Notes (Signed)
IP PROGRESS NOTE  Subjective:  He continues to have nausea and vomiting. He is vomiting blood. The abdominal pain is relieved with Dilaudid. The bowel movements are bloody.   Objective: Vital signs in last 24 hours: Blood pressure 90/60, pulse 108, temperature 97.7 F (36.5 C), temperature source Oral, resp. rate 16, height 5\' 9"  (1.753 m), weight 164 lb 3.9 oz (74.5 kg), SpO2 100 %.  Intake/Output from previous day: 01/18 0701 - 01/19 0700 In: 6681.4 [I.V.:3647.1; Blood:2034.3; IV Piggyback:1000] Out: 1600 [Urine:1600]  Physical Exam:   Abdomen: Soft, nontender    Portacath/PICC-without erythema  Lab Results:  Recent Labs  12/10/14 0410  12/10/14 2049 12/11/14 0540  WBC 13.4*  --   --  18.0*  HGB 7.1*  < > 10.2* 9.3*  HCT 22.0*  < > 29.7* 26.4*  PLT 402*  --   --  325  < > = values in this interval not displayed.  BMET  Recent Labs  12/10/14 0410 12/11/14 0540  NA 139 139  K 4.1 4.2  CL 109 114*  CO2 24 21  GLUCOSE 98 145*  BUN 13 16  CREATININE 0.50 0.60  CALCIUM 7.1* 6.7*    Studies/Results: Dg Chest Port 1 View  12/09/2014   CLINICAL DATA:  Hypotension, hypoxia, shortness of breath, weakness, rectal bleeding, coffee ground emesis, symptoms began today, history pancreatic cancer, hypertension, former smoker  EXAM: PORTABLE CHEST - 1 VIEW  COMPARISON:  Portable exam 1610 hr compared to 11/28/2014  FINDINGS: LEFT subclavian Port-A-Cath with tip projecting over SVC.  Normal heart size, mediastinal contours, and pulmonary vascularity.  Lungs clear.  No pleural effusion or pneumothorax.  Bones unremarkable.  IMPRESSION: No acute abnormalities.   Electronically Signed   By: Lavonia Dana M.D.   On: 12/09/2014 17:53    Medications: I have reviewed the patient's current medications.  Assessment/Plan:  1. 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  CT scans 07/31/2014  and 08/30/2014 concerning for involvement of the superior mesenteric artery  Cycle 1 FOLFIRINOX 09/26/2014  Cycle 2 FOLFIRINOX 10/10/2014  CA 19-9 improved (55.5) on 10/24/2014.  Cycle 3 FOLFIRINOX 10/31/2014.  CA-19-9 improved (33.4) on 11/13/2014.  Cycle 4 FOLFIRINOX 11/13/2014.  CA-19-9 improved (30.5) 11/26/2014.  Restaging CT 11/29/2014 consistent with progression of the primary pancreas tumor with invasion of the duodenum, respiratory lymphadenopathy, and liver metastases 2. Pain secondary to #1  3. Psoriasis   4. Acute nausea and vomiting with cycle 1 FOLFIRINOX-emend and prophylactic Decadron were added with cycle 2, improved.  5. Urinary hesitancy-this is most likely related to narcotic analgesics, chemotherapy, and prostatic hypertrophy. 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 Improved with Flomax.  6. Anorexia/weight loss secondary to #1  7. Admission with hypotension and severe anemia secondary to upper gastrointestinal bleeding on 11/28/2013   Upper endoscopy 11/28/2013 confirmed a friable mass in the duodenum consistent with pancreas cancer invading the duodenum  Embolization of branches of the gastroduodenal and superior mesenteric arteries on 11/30/2014  Palliative radiation/Xeloda started 12/06/2014  Admission 12/09/2014 with recurrent GI bleeding         8. Severe anemia secondary to acute GI blood loss  He continues to have GI bleeding. I discussed the situation with Richard Cantu and his family. He has an incurable malignancy and limited treatment options for tumor related bleeding. He is in agreement with a comfort care approach. We discussed CPR and ACLS issues. He will be  placed on a no CODE BLUE status. The plan is to continue Dilaudid for pain. He reports some relief of the nausea with Zofran and Ativan.  I discussed the case and reviewed the films from last week with interventional radiology. They will  contact me if they feel additional intervention would be helpful.  Recommendations: 1. Continue IV Dilaudid for pain. 2. IV Ativan for anxiety and nausea 3. Consider transferring him to the third for oncology unit for comfort care    LOS: 2 days   [facility], Dominica Severin  12/11/2014, 12:50 PM

## 2014-12-11 NOTE — Progress Notes (Signed)
Patient states he has vomited some blood and continues to pass blood per rectum. Says he does not want another blood transfusion. Says he just wants to be kept comfortable. Says he realizes there is not much more we can do and he is not going to be cured of his cancer. We will sign off.

## 2014-12-12 ENCOUNTER — Ambulatory Visit: Payer: BLUE CROSS/BLUE SHIELD

## 2014-12-12 DIAGNOSIS — N400 Enlarged prostate without lower urinary tract symptoms: Secondary | ICD-10-CM

## 2014-12-12 MED ORDER — HYDROMORPHONE HCL 2 MG/ML IJ SOLN
2.0000 mg | INTRAMUSCULAR | Status: DC | PRN
  Administered 2014-12-12 – 2014-12-17 (×27): 2 mg via INTRAVENOUS
  Filled 2014-12-12 (×28): qty 1

## 2014-12-12 NOTE — Progress Notes (Signed)
I visited with the patient and his family today. He had been undergoing palliative radiation treatment before his performance status declined associated with this hospital stay. Radiation treatment has been put on hold. The patient to me today reiterated his wish not to proceed with any aggressive treatment currently. I will continue to follow the patient, but at this time I do not anticipate restarting radiation treatment. Please let me know if I can be of any assistance.  ------------------------------------------------  Jodelle Gross, MD, PhD

## 2014-12-12 NOTE — Progress Notes (Signed)
Patient ID: Richard Cantu, male   DOB: 1956-07-01, 59 y.o.   MRN: [id number] [facility] PROGRESS NOTE  Marlon Suleiman Nuzzo YHC:[id number] DOB: 12/15/1955 DOA: 12/09/2014 PCP: Ned Card, NP  Brief narrative:    59 y.o. 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 He subsequently was discharged on 12/05/14 with initiation of palliative radiation on 12/06/14 as well as further treatment with Xeloda, who presented to the hospital for admission on 12/09/14 with recurrent GI bleeding and hemoptysis. Hemoglobin was 5.5 on admission. He did receive one dose of Indocin for treatment of gout. At this time, patient does not wish to have any more procedures. He wants to be kept comfortable. Code status: DNR.  Assessment/Plan:     Principal Problem: GI hemorrhage from duodenal mass in the setting of metastatic pancreatic cancer - Status post total of 4 units of PRBCs since admission. - Continue protonix drip. Patient did not have episodes of vomiting or hematemesis since yesterday. - GI consultation performed 12/09/14, no role for repeat EGD with his known friable duodenal mass. Repeat embolization not indicated per IR. Recommendations for transfusion and supportive care. - Appreciate Dr. Benay Spice seeing the pt. Hold Xeloda. Hold Indocin. - Patient can be transferred to medical floor once bed is available  Active Problems:  Cancer related pain - Continue MS Contin.   Protein-calorie malnutrition, severe - per pt request,  advance diet to soft.    Esophageal reflux - Continue PPI therapy.   BPH (benign prostatic hyperplasia) - Flomax currently on hold. Monitor for retention.    DVT Prophylaxis  SCDs ordered.  Code Status: DNR/DNI Family Communication: Olena Leatherwood at bedside [phone]); Wife at bedside. Disposition Plan: transfer to med floor, order placed.   IV Access:  Port-A-Cath  Procedures and diagnostic studies:  Dg Chest Port 1 View 12/09/2014: No acute abnormalities.   Medical Consultants:  Dr. Wilford Corner, Gastroenterology Dr. Julieanne Manson, Oncology Interventional radiology  Palliative care   Anti-Infectives:  None.    Leisa Lenz, MD  [facility] Pager [phone]  If 7PM-7AM, please contact night-coverage [email or web address] Password [email or web address] 12/12/2014, 11:29 AM   LOS: 3 days    HPI/Subjective: No acute overnight events.  Objective: Filed Vitals:   12/11/14 1200 12/11/14 1600 12/11/14 2000 12/12/14 0000  BP: 90/60 109/70 118/75 109/75  Pulse: 108 106 95 86  Temp:   98.1 F (36.7 C)   TempSrc:   Oral   Resp: 16 16 14    Height:      Weight:      SpO2:        Intake/Output Summary (Last 24 hours) at 12/12/14 1129 Last data filed at 12/12/14 0730  Gross per 24 hour  Intake   3360 ml  Output    700 ml  Net   2660 ml    Exam:   General:  Pt is alert, follows commands appropriately, not in acute distress  Cardiovascular: Regular rate and rhythm, S1/S2 (+)  Respiratory: Clear to auscultation bilaterally, no wheezing, no crackles, no rhonchi  Abdomen: Soft, non tender, non distended, bowel sounds present  Extremities: No edema, pulses DP and PT palpable bilaterally  Neuro: Grossly nonfocal  Data Reviewed: Basic Metabolic Panel:  Recent Labs Lab 12/09/14 1743 12/10/14  0410 12/11/14 0540  NA 136 139 139  K 4.4 4.1 4.2  CL 106 109 114*  CO2 21 24 21   GLUCOSE 176* 98 145*  BUN 10 13 16   CREATININE 0.65 0.50 0.60  CALCIUM 7.1* 7.1* 6.7*   Liver Function Tests:  Recent Labs Lab 12/09/14 1743 12/10/14 0410  AST 24 15  ALT 9 11  ALKPHOS 58 54  BILITOT 0.5 0.6  PROT 4.5*  4.4*  ALBUMIN 1.9* 1.8*    Recent Labs Lab 12/09/14 1743  LIPASE 63*   No results for input(s): AMMONIA in the last 168 hours. CBC:  Recent Labs Lab 12/09/14 1743 12/10/14 0410 12/10/14 1530 12/10/14 2049 12/11/14 0540  WBC 20.2* 13.4*  --   --  18.0*  NEUTROABS 18.2* 11.3*  --   --  16.1*  HGB 5.5* 7.1* 7.6* 10.2* 9.3*  HCT 17.6* 22.0* 22.6* 29.7* 26.4*  MCV 90.3 89.4  --   --  88.3  PLT 494* 402*  --   --  325   Cardiac Enzymes: No results for input(s): CKTOTAL, CKMB, CKMBINDEX, TROPONINI in the last 168 hours. BNP: Invalid input(s): POCBNP CBG: No results for input(s): GLUCAP in the last 168 hours.  No results found for this or any previous visit (from the past 240 hour(s)).   Scheduled Meds: . sodium chloride   Intravenous Once  . antiseptic oral rinse  7 mL Mouth Rinse BID  . feeding supplement (RESOURCE BREEZE)  1 Container Oral TID BM  . morphine  100 mg Oral Q12H  . [START ON 12/13/2014] pantoprazole (PROTONIX) IV  40 mg Intravenous Q12H  . sodium chloride  10-40 mL Intracatheter Q12H   Continuous Infusions: . sodium chloride 125 mL/hr at 12/11/14 2333  . pantoprozole (PROTONIX) infusion 8 mg/hr (12/11/14 2333)

## 2014-12-13 ENCOUNTER — Ambulatory Visit: Payer: BLUE CROSS/BLUE SHIELD

## 2014-12-13 LAB — TYPE AND SCREEN
ABO/RH(D): A POS
ANTIBODY SCREEN: NEGATIVE
UNIT DIVISION: 0
UNIT DIVISION: 0
UNIT DIVISION: 0
UNIT DIVISION: 0
Unit division: 0
Unit division: 0
Unit division: 0
Unit division: 0
Unit division: 0
Unit division: 0

## 2014-12-13 NOTE — Progress Notes (Addendum)
Patient ID: Richard Cantu, male   DOB: December 18, 1955, 59 y.o.   MRN: [id number] [facility] PROGRESS NOTE  Richard Cantu SJG:[id number] DOB: [birth year] DOA: 12/09/2014 PCP: Ned Card, NP  Brief narrative:    59 y.o. 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 He subsequently was discharged on 12/05/14 with initiation of palliative radiation on 12/06/14 as well as further treatment with Xeloda, who presented to the hospital for admission on 12/09/14 with recurrent GI bleeding and hemoptysis. Hemoglobin was 5.5 on admission. He did receive one dose of Indocin for treatment of gout. At this time, patient does not wish to have any more procedures. He wants to be kept comfortable. Code status: DNR.  Assessment/Plan:    Principal Problem: GI hemorrhage from duodenal mass in the setting of metastatic pancreatic cancer - Status post total of 4 units of PRBCs since admission. - Stopped protonix drip and started protonix 40 mg IV Q 12 hours.  - GI consulted 12/09/14, no role for repeat EGD with his known friable duodenal mass. Repeat embolization not indicated per IR. Recommendations for transfusion and supportive care. - Appreciate Dr. Gearldine Shown further discussion of goals of care - Continue comfort care   Active Problems:  Cancer related pain - Continue pain management efforts. Now on dilaudid 1   Protein-calorie malnutrition, severe - diet as tolerated   Esophageal reflux - Continue Protonix   BPH (benign prostatic hyperplasia) - Flomax currently on hold. Monitor for retention.   DVT Prophylaxis  SCDs ordered while pt is in hospital   Code Status: DNR/DNI Family  Communication: Olena Leatherwood at bedside [phone]); Wife at bedside. Disposition Plan: home when stable    IV Access:  Port-A-Cath  Procedures and diagnostic studies:  Dg Chest Port 1 View 12/09/2014: No acute abnormalities.   Medical Consultants:  Dr. Wilford Corner, Gastroenterology Dr. Julieanne Manson, Oncology Interventional radiology  Palliative care   Anti-Infectives:  None.    Leisa Lenz, MD  [facility] Pager [phone]  If 7PM-7AM, please contact night-coverage [email or web address] Password [email or web address] 12/13/2014, 11:37 AM   LOS: 4 days    HPI/Subjective: No acute overnight events.  Objective: Filed Vitals:   12/12/14 0125 12/12/14 1824 12/12/14 2250 12/13/14 0655  BP: 105/71 124/81 132/94 114/78  Pulse:  112 108 98  Temp:  97.9 F (36.6 C) 98.4 F (36.9 C) 98.5 F (36.9 C)  TempSrc:  Oral Oral Oral  Resp:  16 16 20   Height:      Weight:      SpO2:  100% 100% 100%    Intake/Output Summary (Last 24 hours) at 12/13/14 1137 Last data filed at 12/12/14 2253  Gross per 24 hour  Intake    240 ml  Output    300 ml  Net    -60 ml    Exam:   General:  Pt is alert, follows commands appropriately, not in acute distress  Cardiovascular: Regular rate and rhythm, S1/S2 appreciated   Respiratory: no wheezing, no crackles, no rhonchi   Data Reviewed: Basic Metabolic Panel:  Recent Labs Lab 12/09/14 1743 12/10/14 0410 12/11/14 0540  NA 136 139 139  K 4.4 4.1 4.2  CL 106 109 114*  CO2 21 24 21   GLUCOSE 176* 98  145*  BUN 10 13 16   CREATININE 0.65 0.50 0.60  CALCIUM 7.1* 7.1* 6.7*   Liver Function Tests:  Recent Labs Lab 12/09/14 1743 12/10/14 0410  AST 24 15  ALT 9 11  ALKPHOS 58 54  BILITOT 0.5 0.6  PROT 4.5* 4.4*  ALBUMIN 1.9* 1.8*    Recent Labs Lab 12/09/14 1743  LIPASE 63*   No results for input(s): AMMONIA in the last 168 hours. CBC:  Recent Labs Lab 12/09/14 1743 12/10/14 0410 12/10/14 1530  12/10/14 2049 12/11/14 0540  WBC 20.2* 13.4*  --   --  18.0*  NEUTROABS 18.2* 11.3*  --   --  16.1*  HGB 5.5* 7.1* 7.6* 10.2* 9.3*  HCT 17.6* 22.0* 22.6* 29.7* 26.4*  MCV 90.3 89.4  --   --  88.3  PLT 494* 402*  --   --  325   Cardiac Enzymes: No results for input(s): CKTOTAL, CKMB, CKMBINDEX, TROPONINI in the last 168 hours. BNP: Invalid input(s): POCBNP CBG: No results for input(s): GLUCAP in the last 168 hours.  No results found for this or any previous visit (from the past 240 hour(s)).   Scheduled Meds: . antiseptic oral rinse  7 mL Mouth Rinse BID  . feeding supplement (RESOURCE BREEZE)  1 Container Oral TID BM  . morphine  100 mg Oral Q12H  . pantoprazole (PROTONIX) IV  40 mg Intravenous Q12H  . sodium chloride  10-40 mL Intracatheter Q12H   Continuous Infusions: . sodium chloride 125 mL/hr at 12/11/14 2333

## 2014-12-14 ENCOUNTER — Ambulatory Visit: Payer: BLUE CROSS/BLUE SHIELD

## 2014-12-14 ENCOUNTER — Ambulatory Visit: Payer: BLUE CROSS/BLUE SHIELD | Admitting: Radiation Oncology

## 2014-12-14 DIAGNOSIS — R601 Generalized edema: Secondary | ICD-10-CM

## 2014-12-14 DIAGNOSIS — R634 Abnormal weight loss: Secondary | ICD-10-CM

## 2014-12-14 DIAGNOSIS — R3911 Hesitancy of micturition: Secondary | ICD-10-CM

## 2014-12-14 MED ORDER — LORAZEPAM 1 MG PO TABS: 1.0000 mg | ORAL_TABLET | Freq: Four times a day (QID) | ORAL | Status: DC | PRN

## 2014-12-14 NOTE — Progress Notes (Signed)
Clinical Social Work Department BRIEF PSYCHOSOCIAL ASSESSMENT 12/14/2014  Patient:  Richard Cantu, Richard Cantu     Account Number:  [id number]     [place] date:  12/09/2014  Clinical Social Worker:  Maryln Manuel  Date/Time:  12/14/2014 09:45 AM  Referred by:  Physician  Date Referred:  12/14/2014 Referred for  Residential hospice placement   Other Referral:   Interview type:  Patient Other interview type:   patient friend present at bedside    PSYCHOSOCIAL DATA Living Status:  ALONE Admitted from facility:   Level of care:   Primary support name:  Legrand Como Drake/friend/[phone] Primary support relationship to patient:  FRIEND Degree of support available:   adequate    CURRENT CONCERNS Current Concerns  Post-Acute Placement   Other Concerns:    SOCIAL WORK ASSESSMENT / PLAN CSW received referral from MD that pt is agreeable to residential hospice placement and interested in [facility].    CSW met with pt and pt friend at bedside. CSW introduced self and explained role. Pt confirmed that he is in agreement to [facility]. CSW clarified pt questions regarding placement at [facility]. CSW provided support as pt discussed that he does not feel that he could manage his care at home. Pt discussed how he wants to maintain his comfort and symptom management. CSW discussed with pt process of referral and that it may not be until the day of bed availability that CSW is made aware to notify pt. Pt expressed understanding. CSW provided pt with CSW contact information and CSW weekend contact information for if pt has any questions or concerns.    CSW contacted [employer], Erling Conte and made referral. CSW faxed clinicals to [facility].    CSW to continue to follow to provide support and assist with transition to residential hospice.   Assessment/plan status:  Psychosocial Support/Ongoing Assessment of Needs Other assessment/ plan:   discharge planning   Information/referral  to community resources:   Residential Hospice List    PATIENT'S/FAMILY'S RESPONSE TO PLAN OF CARE: Pt alert and oriented x 4. Pt friend at bedside supportive of pt. Pt feels that [facility] will be best setting following discharge for his comfort. Per [facility], Dr. Benay Spice had also called referral to [facility] this morning.    Alison Murray, MSW, [place] [facility] [phone]

## 2014-12-14 NOTE — Consult Note (Addendum)
[place] Liaison: Received request from [place] for family interest in [facility]. Also Dr. Benay Spice spoke with [facility] director this morning re this patient. Chart reviewed. Note Dr. Benay Spice to attend. Appreciate help from CSW this morning. Will follow, update CSW. Thank you. Erling Conte LCSW [phone]

## 2014-12-14 NOTE — Progress Notes (Signed)
IP PROGRESS NOTE  Subjective:  The nausea and pain are under good control at present. No further emesis. He reports having a brown stool last night.  Objective: Vital signs in last 24 hours: Blood pressure 121/70, pulse 99, temperature 98.2 F (36.8 C), temperature source Oral, resp. rate 16, height 5\' 9"  (1.753 m), weight 164 lb 3.9 oz (74.5 kg), SpO2 100 %.  Intake/Output from previous day: 01/21 0701 - 01/22 0700 In: 7078.8 [P.O.:1280; I.V.:5798.8] Out: 751 [Urine:750; Stool:1]  Physical Exam: Lungs: Clear anteriorly Cardiac: Regular rate and rhythm  Abdomen: Mildly distended, soft and nontender Vascular: Pitting edema in the lower arms and legs bilaterally    Portacath/PICC-without erythema  Medications: I have reviewed the patient's current medications.  Assessment/Plan:  1. 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  Cycle 3 FOLFIRINOX 10/31/2014.  CA-19-9 improved (33.4) on 11/13/2014.  Cycle 4 FOLFIRINOX 11/13/2014.  CA-19-9 improved (30.5) 11/26/2014.  Restaging CT 11/29/2014 consistent with progression of the primary pancreas tumor with invasion of the duodenum, respiratory lymphadenopathy, and liver metastases 2. Pain secondary to #1  3. Psoriasis   4. Acute nausea and vomiting with cycle 1 FOLFIRINOX-emend and prophylactic Decadron were added with cycle 2, improved.  5. Urinary hesitancy-this is most likely related to narcotic analgesics, chemotherapy, and prostatic hypertrophy. 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 Improved with Flomax.  6. Anorexia/weight loss secondary to #1  7. Admission with hypotension and  severe anemia secondary to upper gastrointestinal bleeding on 11/28/2013   Upper endoscopy 11/28/2013 confirmed a friable mass in the duodenum consistent with pancreas cancer invading the duodenum  Embolization of branches of the gastroduodenal and superior mesenteric arteries on 11/30/2014  Palliative radiation/Xeloda started 12/06/2014  Admission 12/09/2014 with recurrent GI bleeding         8. Severe anemia secondary to acute GI blood loss        9. Anasarca secondary to intravenous hydration  The GI bleeding appears to have slowed. His condition has stabilized over the past 2 days. I discussed disposition plans with Mr Franchi. We discussed home Hospice care versus a referral to [facility] place. He appears to be a good candidate for [facility] place. The GI bleeding could recur over the next several days or weeks. He will be a candidate for a Dilaudid drip at [facility] place if he require frequent bolus dosing.  Recommendations: 1. [employer] referral for consideration of [employer]. 2. Continue Dilaudid and MS Contin 3. Try oral Ativan for anxiety and nausea 4. Decrease IV fluids 5. Please call oncology as needed over the weekend. I will see him 12/17/2014 if he remains in the hospital. I will follow him at discharge with the [facility] program.    LOS: 5 days   [facility], Dominica Severin  12/14/2014, 9:13 AM

## 2014-12-14 NOTE — Progress Notes (Signed)
Patient ID: RALPH BROUWER, male   DOB: Nov 03, 1956, 59 y.o.   MRN: [id number] [facility] PROGRESS NOTE  Selby Foisy Armwood QIH:[id number] DOB: 01-26-1956 DOA: 12/09/2014 PCP: Ned Card, NP  Brief narrative:    59 y.o. 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 He subsequently was discharged on 12/05/14 with initiation of palliative radiation on 12/06/14 as well as further treatment with Xeloda, who presented to the hospital for admission on 12/09/14 with recurrent GI bleeding and hemoptysis. Hemoglobin was 5.5 on admission. He did receive one dose of Indocin for treatment of gout.  At this time, patient does not wish to have any more procedures. He wants to be kept comfortable. Code status: DNR.  Assessment/Plan:    Principal Problem: GI hemorrhage from duodenal mass in the setting of metastatic pancreatic cancer - Status post total of 4 units of PRBCs since admission. - Stopped protonix drip and started protonix 40 mg IV Q 12 hours.  - GI consulted 12/09/14, no role for repeat EGD with his known friable duodenal mass. Repeat embolization not indicated per IR. Recommendations for transfusion and supportive care. - Appreciate Dr. Gearldine Shown assistance  - Continue comfort care as per pt's wishes   Active Problems:  Cancer related pain - Continue pain management efforts. Dilaudid as needed is adequate in pain control per pt - continue scheduled Morphine 100 mg BID   Protein-calorie malnutrition, severe - diet as tolerated   Esophageal reflux - Continue Protonix   BPH (benign prostatic hyperplasia) - Flomax currently on hold. Monitor for retention.   DVT Prophylaxis  SCDs ordered  while pt is in hospital  Code Status: DNR/DNI Family Communication: Olena Leatherwood at bedside [phone]); Wife at bedside. Disposition Plan: home when stable but for now remains inpatient    IV Access:  Port-A-Cath  Procedures and diagnostic studies:  Dg Chest Port 1 View 12/09/2014: No acute abnormalities.   Medical Consultants:  Dr. Wilford Corner, Gastroenterology Dr. Julieanne Manson, Oncology Interventional radiology  Palliative care   Anti-Infectives:  None.   Leisa Lenz, MD  [facility] Pager [phone]  If 7PM-7AM, please contact night-coverage [email or web address] Password [facility] 12/14/2014, 9:13 AM   LOS: 5 days    HPI/Subjective: No acute overnight events. Pt denies pain, dyspnea or chest pain.   Objective: Filed Vitals:   12/13/14 1324 12/13/14 2045 12/13/14 2048 12/14/14 0537  BP: 122/71  107/73 121/70  Pulse: 98 92 94 99  Temp: 98.3 F (36.8 C)  98 F (36.7 C) 98.2 F (36.8 C)  TempSrc: Oral  Oral Oral  Resp: 16  18 16   Height:      Weight:      SpO2: 100%  95% 100%    Intake/Output Summary (Last 24 hours) at 12/14/14 0913 Last data filed at 12/14/14 1660  Gross per 24 hour  Intake 7078.75 ml  Output    751 ml  Net 6327.75 ml    Exam:   General:  Pt is alert, follows commands appropriately, not in acute distress  Cardiovascular: Regular rate and rhythm, S1/S2, no murmurs  Respiratory: Clear to auscultation bilaterally, no wheezing, diminished breath sounds at bases   Abdomen: Soft, non tender, non distended, bowel sounds present  Data Reviewed: Basic Metabolic Panel:  Recent Labs Lab  12/09/14 1743 12/10/14 0410 12/11/14 0540  NA 136 139 139  K 4.4 4.1 4.2  CL 106 109 114*  CO2 21 24 21   GLUCOSE 176* 98 145*  BUN 10 13 16   CREATININE 0.65 0.50 0.60  CALCIUM 7.1* 7.1* 6.7*   Liver Function Tests:  Recent Labs Lab 12/09/14 1743 12/10/14 0410  AST 24 15  ALT 9 11  ALKPHOS 58 54  BILITOT  0.5 0.6  PROT 4.5* 4.4*  ALBUMIN 1.9* 1.8*    Recent Labs Lab 12/09/14 1743  LIPASE 63*   No results for input(s): AMMONIA in the last 168 hours. CBC:  Recent Labs Lab 12/09/14 1743 12/10/14 0410 12/10/14 1530 12/10/14 2049 12/11/14 0540  WBC 20.2* 13.4*  --   --  18.0*  NEUTROABS 18.2* 11.3*  --   --  16.1*  HGB 5.5* 7.1* 7.6* 10.2* 9.3*  HCT 17.6* 22.0* 22.6* 29.7* 26.4*  MCV 90.3 89.4  --   --  88.3  PLT 494* 402*  --   --  325   Scheduled Meds: . morphine  100 mg Oral Q12H  . Pantoprazole IV  40 mg Intravenous Q12H  . sodium chloride  10-40 mL Intracatheter Q12H   Continuous Infusions: . sodium chloride 1,000 mL (12/14/14 0609)

## 2014-12-15 NOTE — Plan of Care (Signed)
Problem: Phase II Progression Outcomes Goal: No active bleeding Outcome: Progressing Patient reports having a stool with dark red blood today

## 2014-12-15 NOTE — Progress Notes (Signed)
Patient ID: [name], male   DOB: 12-18-1955, 59 y.o.   MRN: [id number] [facility] PROGRESS NOTE  Richard Cantu RDE:[id number] DOB: [birth year] DOA: 12/09/2014 PCP: Ned Card, NP  Brief narrative:    59 y.o. 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 He subsequently was discharged on 12/05/14 with initiation of palliative radiation on 12/06/14 as well as further treatment with Xeloda, who presented to the hospital for admission on 12/09/14 with recurrent GI bleeding and hemoptysis. Hemoglobin was 5.5 on admission. Patient received total of 4 units of blood transfusion since the admission. He was on Protonix drip and this was then converted to Protonix 40 mg IV every 12 hours. He did receive one dose of Indocin for treatment of gout. In past 72 hours, patient did not have hemoptysis but he does have dark stool and blood with bowel movement.   Assessment/Plan:     Principal Problem: GI hemorrhage from duodenal mass in the setting of metastatic pancreatic cancer - Hemoglobin on admission 5.5. Patient is status post 4 units of PRBCs transfusion since the admission. - Patient was on Protonix drip. Currently he is on Protonix 40 mg IV every 12 hours. - Per GI, there is no role for repeat endoscopy with known friable duodenal mass. Interventional radiology does not feel that patient is a good candidate for repeat embolization. - Current recommendation is for supportive care with blood transfusion if needed. - Plan is for residential hospice once bed available.   Active Problems:  Cancer related pain - Continue pain management efforts. Pain is 5 out of 10 in intensity this morning.    Protein-calorie malnutrition, severe - diet as tolerated   Esophageal reflux - Continue Protonix 40 mg IV every 12 hours.   BPH (benign prostatic hyperplasia) - Flomax currently on hold. No reports of urinary retention.   DVT Prophylaxis  SCDs ordered due to risk of bleeding  Code Status: DNR/DNI Family Communication: Olena Leatherwood at bedside [phone]) Disposition Plan: remains inpatient    IV Access:  Port-A-Cath  Procedures and diagnostic studies:  Dg Chest Port 1 View 12/09/2014: No acute abnormalities.   Medical Consultants:  Dr. Wilford Corner, Gastroenterology Dr. Julieanne Manson, Oncology Interventional radiology  Palliative care   Anti-Infectives:  None.   Leisa Lenz, MD  [facility] Pager [phone]  If 7PM-7AM, please contact night-coverage [email or web address] Password [facility] 12/15/2014, 10:54 AM   LOS: 6 days    HPI/Subjective: No acute overnight events.  Objective: Filed Vitals:   12/14/14 0537 12/14/14 1400 12/14/14 1930 12/14/14 2112  BP: 121/70 122/77  134/75  Pulse: 99 94 102 109  Temp: 98.2 F (36.8 C) 98 F (36.7 C)  98.5 F (36.9 C)  TempSrc: Oral Oral  Oral  Resp: 16 18  18   Height:      Weight:      SpO2: 100% 100%  95%    Intake/Output Summary (Last 24 hours) at 12/15/14 1054 Last data filed at 12/15/14 0750  Gross per 24 hour  Intake 890.41 ml  Output    975 ml  Net -84.59 ml    Exam:   General: Pt is not in distress  Cardiovascular: Regular rate and rhythm, S1/S2 (+)  Respiratory: bilateral air entry, no wheezing  Abdomen: non distended, nontender abdomen, appreciate bowel sounds  Data Reviewed: Basic Metabolic Panel:  Recent Labs Lab 12/09/14 1743 12/10/14 0410 12/11/14 0540  NA 136 139 139  K 4.4 4.1 4.2  CL 106 109 114*  CO2 21 24 21   GLUCOSE 176* 98 145*  BUN 10 13 16   CREATININE 0.65 0.50 0.60  CALCIUM 7.1* 7.1* 6.7*   Liver Function Tests:  Recent  Labs Lab 12/09/14 1743 12/10/14 0410  AST 24 15  ALT 9 11  ALKPHOS 58 54  BILITOT 0.5 0.6  PROT 4.5* 4.4*  ALBUMIN 1.9* 1.8*    Recent Labs Lab 12/09/14 1743  LIPASE 63*   No results for input(s): AMMONIA in the last 168 hours. CBC:  Recent Labs Lab 12/09/14 1743 12/10/14 0410 12/10/14 1530 12/10/14 2049 12/11/14 0540  WBC 20.2* 13.4*  --   --  18.0*  NEUTROABS 18.2* 11.3*  --   --  16.1*  HGB 5.5* 7.1* 7.6* 10.2* 9.3*  HCT 17.6* 22.0* 22.6* 29.7* 26.4*  MCV 90.3 89.4  --   --  88.3  PLT 494* 402*  --   --  325   Cardiac Enzymes: No results for input(s): CKTOTAL, CKMB, CKMBINDEX, TROPONINI in the last 168 hours. BNP: Invalid input(s): POCBNP CBG: No results for input(s): GLUCAP in the last 168 hours.  No results found for this or any previous visit (from the past 240 hour(s)).   Scheduled Meds: . antiseptic oral rinse  7 mL Mouth Rinse BID  . feeding supplement (RESOURCE BREEZE)  1 Container Oral TID BM  . morphine  100 mg Oral Q12H  . pantoprazole (PROTONIX) IV  40 mg Intravenous Q12H  . sodium chloride  10-40 mL Intracatheter Q12H   Continuous Infusions: . sodium chloride 10 mL/hr (12/14/14 1040)

## 2014-12-16 MED ORDER — ONDANSETRON HCL 4 MG PO TABS: 4.0000 mg | ORAL_TABLET | Freq: Four times a day (QID) | ORAL | Status: AC | PRN

## 2014-12-16 MED ORDER — PANTOPRAZOLE SODIUM 40 MG IV SOLR: 40.0000 mg | Freq: Two times a day (BID) | INTRAVENOUS | Status: AC

## 2014-12-16 MED ORDER — FUROSEMIDE 10 MG/ML IJ SOLN
20.0000 mg | Freq: Once | INTRAMUSCULAR | Status: AC
  Administered 2014-12-16: 20 mg via INTRAVENOUS
  Filled 2014-12-16: qty 2

## 2014-12-16 MED ORDER — HYDROMORPHONE HCL 2 MG/ML IJ SOLN: 2.0000 mg | INTRAMUSCULAR | Status: AC | PRN

## 2014-12-16 MED ORDER — LORAZEPAM 2 MG/ML IJ SOLN: 1.0000 mg | INTRAMUSCULAR | Status: AC | PRN

## 2014-12-16 NOTE — Progress Notes (Addendum)
Patient ID: Richard Cantu, male   DOB: 03/30/1956, 59 y.o.   MRN: [id number] [facility] PROGRESS NOTE  Richard Cantu [id number]:[id number] DOB: 1956-03-18 DOA: 12/09/2014 PCP: Ned Card, NP  Brief narrative:    59 y.o. 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 He subsequently was discharged on 12/05/14 with initiation of palliative radiation on 12/06/14 as well as further treatment with Xeloda, who presented to the hospital for admission on 12/09/14 with recurrent GI bleeding and hemoptysis. Hemoglobin was 5.5 on admission. Patient received total of 4 units of blood transfusion since the admission. He was on Protonix drip and this was then converted to Protonix 40 mg IV every 12 hours. He did receive one dose of Indocin for treatment of gout. Pt continues to be stable with no hemoptysis but he does have dark stool and blood with bowel movement.   Assessment/Plan:    Principal Problem: GI hemorrhage from duodenal mass in the setting of metastatic pancreatic cancer - GI bleed likely from duodenal mass from pancreatic cancer. - Patient has required 4 units of PRBC transfusion since the admission. He was on Protonix drip but this was converted to Protonix 40 mg IV every 12 hours. - No further reports of hemoptysis. He still has dark stool and some blood with bowel movement. - Per GI, there is no role for repeat endoscopy with known friable duodenal mass. Interventional radiology does not feel that patient is a good candidate for repeat embolization. - Current recommendation is for supportive care with blood transfusion if needed. - Plan is for residential hospice once bed available.  Active  Problems:  Cancer related pain - Pain controlled this morning.   Protein-calorie malnutrition, severe - diet as tolerated   Esophageal reflux - We will continue Protonix 40 mg IV every 12 hours.   BPH (benign prostatic hyperplasia) - No urinary retention. Flomax still on hold.   DVT Prophylaxis  SCDs ordered due to risk of bleeding  Code Status: DNR/DNI Family Communication: Olena Leatherwood at bedside [phone]) Disposition Plan: remains inpatient    IV Access:  Port-A-Cath  Procedures and diagnostic studies:  Dg Chest Port 1 View 12/09/2014: No acute abnormalities.   Medical Consultants:  Dr. Wilford Corner, Gastroenterology Dr. Julieanne Manson, Oncology Interventional radiology  Palliative care   Anti-Infectives:  None.  Leisa Lenz, MD  [facility] Pager [phone]  If 7PM-7AM, please contact night-coverage [email or web address] Password [facility] 12/16/2014, 9:55 AM   LOS: 7 days    HPI/Subjective: No acute overnight events.  Objective: Filed Vitals:   12/14/14 2112 12/15/14 1453 12/15/14 2140 12/16/14 0601  BP: 134/75 115/78 134/81 123/84  Pulse: 109 90 117 100  Temp: 98.5 F (36.9 C) 98.2 F (36.8 C) 99.7 F (37.6 C) 98.5 F (36.9 C)  TempSrc: Oral Oral Oral Oral  Resp: 18 18 16 18   Height:      Weight:      SpO2: 95% 96% 97% 97%    Intake/Output Summary (Last 24 hours) at 12/16/14 0955 Last data filed at 12/15/14 2152  Gross per 24 hour  Intake   64.5 ml  Output    575 ml  Net -510.5 ml    Exam:   General: Pt is awake, alert, pt is no in distress  Cardiovascular: S1,S2, RRR  Respiratory: no wheezing, no rhonchi   Abdomen: (+)  Bowel sounds, non tender  Extremities: swollen upper extremities, about +1 but no pitting   Data Reviewed: Basic Metabolic Panel:  Recent Labs Lab 12/09/14 1743 12/10/14 0410 12/11/14 0540  NA 136 139 139  K 4.4 4.1 4.2  CL 106 109 114*  CO2 21 24 21    GLUCOSE 176* 98 145*  BUN 10 13 16   CREATININE 0.65 0.50 0.60  CALCIUM 7.1* 7.1* 6.7*   Liver Function Tests:  Recent Labs Lab 12/09/14 1743 12/10/14 0410  AST 24 15  ALT 9 11  ALKPHOS 58 54  BILITOT 0.5 0.6  PROT 4.5* 4.4*  ALBUMIN 1.9* 1.8*    Recent Labs Lab 12/09/14 1743  LIPASE 63*   No results for input(s): AMMONIA in the last 168 hours. CBC:  Recent Labs Lab 12/09/14 1743 12/10/14 0410 12/10/14 1530 12/10/14 2049 12/11/14 0540  WBC 20.2* 13.4*  --   --  18.0*  NEUTROABS 18.2* 11.3*  --   --  16.1*  HGB 5.5* 7.1* 7.6* 10.2* 9.3*  HCT 17.6* 22.0* 22.6* 29.7* 26.4*  MCV 90.3 89.4  --   --  88.3  PLT 494* 402*  --   --  325   Cardiac Enzymes: No results for input(s): CKTOTAL, CKMB, CKMBINDEX, TROPONINI in the last 168 hours. BNP: Invalid input(s): POCBNP CBG: No results for input(s): GLUCAP in the last 168 hours.  No results found for this or any previous visit (from the past 240 hour(s)).   Scheduled Meds: . antiseptic oral rinse  7 mL Mouth Rinse BID  . feeding supplement (RESOURCE BREEZE)  1 Container Oral TID BM  . furosemide  20 mg Intravenous Once  . morphine  100 mg Oral Q12H  . pantoprazole (PROTONIX) IV  40 mg Intravenous Q12H  . sodium chloride  10-40 mL Intracatheter Q12H   Continuous Infusions: . sodium chloride 10 mL/hr (12/14/14 1040)

## 2014-12-16 NOTE — Discharge Instructions (Signed)

## 2014-12-16 NOTE — Consult Note (Signed)
[facility] Liaison: [place] room available for Richard Cantu Monday if he is agreeable. CSW and MD aware. Will follow up in am. Thank you. Erling Conte LCSW [phone]

## 2014-12-16 NOTE — Discharge Summary (Addendum)
Physician Discharge Summary  Richard Cantu JSE:[id number] DOB: 11-22-1956 DOA: 12/09/2014  PCP: Ned Card, NP  Admit date: 12/09/2014 Discharge date: 12/17/2014  Recommendations for Outpatient Follow-up:  1. No further work up; continue pain management for comfort   Discharge Diagnoses:  Principal Problem:   GI hemorrhage Active Problems:   Cancer of head of pancreas   Protein-calorie malnutrition, severe   Esophageal reflux   BPH (benign prostatic hyperplasia)   Cancer associated pain    Discharge Condition: stable   Diet recommendation: as tolerated   History of present illness:  59 y.o. 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 He subsequently was discharged on 12/05/14 with initiation of palliative radiation on 12/06/14 as well as further treatment with Xeloda, who presented to the hospital for admission on 12/09/14 with recurrent GI bleeding and hemoptysis. Hemoglobin was 5.5 on admission. Patient received total of 4 units of blood transfusion since the admission. He was on Protonix drip and this was then converted to Protonix 40 mg IV every 12 hours. He did receive one dose of Indocin for treatment of gout.   Assessment/Plan:    Principal Problem: GI hemorrhage from duodenal mass in the setting of metastatic pancreatic cancer - GI bleed likely from duodenal mass from pancreatic cancer. - Patient has required 4 units of PRBC transfusion since the admission. He was on Protonix drip but this was converted to Protonix 40 mg IV every 12 hours. - No further reports of hemoptysis nut has blood with BM - Per GI, there is no further role for repeat endoscopy with known friable duodenal  mass. Interventional radiology does not feel that patient is a good candidate for repeat embolization. - continue supportive care with protonix, pain management   Active Problems:  Cancer related pain - Pain controlled with dilaudid IV PRN and MS contin scheduled   Protein-calorie malnutrition, severe - diet as tolerated   Esophageal reflux - We will continue Protonix 40 mg IV every 12 hours.   BPH (benign prostatic hyperplasia) - No urinary retention. Flomax on hold.   DVT Prophylaxis  SCDs ordered in hospital due to risk of bleeding  Code Status: DNR/DNI Family Communication: Olena Leatherwood at bedside [phone])   IV Access:  Port-A-Cath  Procedures and diagnostic studies:  Dg Chest Port 1 View 12/09/2014: No acute abnormalities.   Medical Consultants:  Dr. Wilford Corner, Gastroenterology Dr. Julieanne Manson, Oncology Interventional radiology  Palliative care   Anti-Infectives:  None.    SignedLeisa Lenz, MD  [facility] 12/16/2014, 10:48 PM  Pager #: [phone]   Discharge Exam: Filed Vitals:   12/16/14 2016  BP: 122/80  Pulse: 105  Temp: 98.9 F (37.2 C)  Resp: 16   Filed Vitals:   12/15/14 2140 12/16/14 0601 12/16/14 1519 12/16/14 2016  BP: 134/81 123/84 118/78 122/80  Pulse: 117 100 99 105  Temp: 99.7 F (37.6 C) 98.5 F (36.9 C) 99.1 F (37.3 C) 98.9 F (37.2 C)  TempSrc: Oral Oral Oral Oral  Resp: 16 18 18 16   Height:      Weight:      SpO2: 97% 97% 97% 98%    General: Pt is alert, follows commands appropriately, not in acute distress Cardiovascular: Regular rate and rhythm, S1/S2 (+)  Respiratory: no wheezing, bilateral air entry  Abdominal: non distended, bowel sounds +, no guarding Extremities: UE non pitting edema, no cyanosis, pulses palpable Neuro: Grossly nonfocal  Discharge Instructions  Discharge Instructions    Call MD for:  difficulty breathing, headache or  visual disturbances    Complete by:  As directed      Call MD for:  persistant dizziness or light-headedness    Complete by:  As directed      Call MD for:  persistant nausea and vomiting    Complete by:  As directed      Call MD for:  severe uncontrolled pain    Complete by:  As directed      Diet - low sodium heart healthy    Complete by:  As directed      Increase activity slowly    Complete by:  As directed             Medication List    STOP taking these medications        ALPRAZolam 1 MG tablet  Commonly known as:  XANAX     capecitabine 500 MG tablet  Commonly known as:  XELODA     hyaluronate sodium Gel     HYDROmorphone 8 MG tablet  Commonly known as:  DILAUDID  Replaced by:  HYDROmorphone 2 MG/ML injection     indomethacin 50 MG capsule  Commonly known as:  INDOCIN     lactose free nutrition Liqd     lidocaine-prilocaine cream  Commonly known as:  EMLA     pantoprazole 40 MG tablet  Commonly known as:  PROTONIX  Replaced by:  pantoprazole 40 MG injection     PRESCRIPTION MEDICATION     prochlorperazine 10 MG tablet  Commonly known as:  COMPAZINE     tamsulosin 0.4 MG Caps capsule  Commonly known as:  FLOMAX      TAKE these medications        HYDROmorphone 2 MG/ML injection  Commonly known as:  DILAUDID  Inject 1 mL (2 mg total) into the vein every hour as needed for moderate pain or severe pain.     LORazepam 2 MG/ML injection  Commonly known as:  ATIVAN  Inject 0.5 mLs (1 mg total) into the vein every 4 (four) hours as needed for anxiety (nausea).     morphine 100 MG 12 hr tablet  Commonly known as:  MS CONTIN  Take 1 tablet (100 mg total) by mouth every 12 (twelve) hours.     ondansetron 4 MG tablet  Commonly known as:  ZOFRAN  Take 1 tablet (4 mg total) by mouth every 6 (six) hours as needed for nausea.     pantoprazole 40 MG injection  Commonly known as:  PROTONIX  Inject 40 mg into the vein every 12 (twelve) hours.           The results of significant diagnostics from this hospitalization (including imaging, microbiology, ancillary and laboratory) are listed below for reference.    Significant Diagnostic Studies: Ct Abdomen Pelvis W Wo Contrast  12/06/2014   CLINICAL DATA:  Followup pancreatic carcinoma  EXAM: CT CHEST, ABDOMEN, AND PELVIS WITH CONTRAST  TECHNIQUE: Multidetector CT imaging of the chest, abdomen and pelvis was performed following the standard protocol during bolus administration of intravenous contrast.  CONTRAST:  19mL OMNIPAQUE IOHEXOL 300 MG/ML  SOLN  COMPARISON:  CT abdomen and pelvis from 11/29/2014. CT chest from 09/06/2014  FINDINGS: CT CHEST FINDINGS  Mediastinum: Normal heart size. There is no pericardial effusion. Calcified atherosclerotic disease involves the thoracic aorta as well as the LAD coronary artery. The trachea is patent and appears midline. Normal appearance of the esophagus. No mediastinal or hilar adenopathy identified.  Lungs/Pleura: Mild pleural thickening is identified along the posterior right lung. There is no suspicious pulmonary nodule or mass identified.  Musculoskeletal: No aggressive lytic or sclerotic bone lesions identified.  CT ABDOMEN AND PELVIS FINDINGS  Hepatobiliary: Multifocal liver metastases are again identified. Lateral segment of left lobe of liver lesion measures 1.6 cm, image 86/series 6. Previously 1.3 cm. Central right hepatic lobe lesion measures 1.7 cm, image 86/ series 6. Previously 1.6 cm. Posterior lateral right hepatic lobe lesion measures 3 cm, image 97/ series 6. Previously 2.9 cm. The gallbladder appears normal. Mild intrahepatic bile duct dilatation is similar to previous exam. The common bile duct Measures 1.3 cm, image 52 of series 602. Previously 1.0 cm.  Pancreas: Necrotic mass involving the pancreas is again identified. This measures 10 x 6 cm, image 67/series 2. Previously 9.5 x 5.7 cm. The mass encases the superior mesenteric artery which  has subsequently undergone coil embolization.  Spleen: Normal appearance of the spleen. The spleen is upper limits of normal in size measuring 12 cm.  Adrenals/Urinary Tract: The adrenal glands are both normal. Unremarkable appearance of both kidneys. Unremarkable appearance of the urinary bladder.  Stomach/Bowel: The stomach is normal. Pancreatic tumor has mass effect upon the duodenum. The mid and distal small bowel loops are unremarkable. The appendix is visualized and appears normal. Normal appearance of the colon.  Vascular/Lymphatic: Calcified atherosclerotic disease involves the abdominal aorta. Thrombus formation is identified within the proximal SMA status post coil embolization. Coils within the gastro duodenal artery also noted. The portal vein remains patent. The splenic vein is also patent. Retroperitoneal adenopathy is again noted. Index periaortic lymph node measures 2.3 cm, image 124/ series 6. Previously 2.1 cm. Aortocaval lymph node measures 1.4 cm, image 109/series 6. Previously 1.3 cm. Pre aortic lymph node measures 1.3 cm, image 140/ series 6. Previously 1 cm.  Reproductive: Prostate gland enlargement is noted.  Other: Small amount of ascites identified within the pelvis.  Musculoskeletal: No aggressive lytic or sclerotic bone lesions identified. Degenerative disc disease is noted at the L5-S1 level.  IMPRESSION: 1. Mild increase in size of large necrotic mass involving the pancreas. 2. Stable to mild increase in size of multi focal liver metastasis. 3. Increase in size of retroperitoneal adenopathy. 4. No evidence for metastatic disease to the chest. 5. Atherosclerotic disease including multi vessel coronary artery calcification. 6. Changes compatible with interval coil embolization of the gastroduodenal artery and superior mesenteric artery. 7. Mild increase in caliber of the common bile duct.   Electronically Signed   By: Kerby Moors M.D.   On: 12/06/2014 09:48   Ct Abdomen Pelvis W Wo  Contrast  11/29/2014   CLINICAL DATA:  59 year old male with history of pancreatic cancer diagnosed in October 2015. Abdominal pain. History of chronic pancreatitis. Assess for potential resectability of the mass.  EXAM: CT ABDOMEN AND PELVIS WITHOUT AND WITH CONTRAST  TECHNIQUE: Multidetector CT imaging of the abdomen and pelvis was performed following the standard protocol before and following the bolus administration of intravenous contrast.  CONTRAST:  16mL OMNIPAQUE IOHEXOL 300 MG/ML  SOLN  COMPARISON:  CT of the abdomen and pelvis 08/30/2014.  FINDINGS: Lower chest: Areas of mild scarring or subsegmental atelectasis are noted in the lung bases  bilaterally. Small amount of right-sided pleural thickening.  Hepatobiliary: Compared to the prior examination there are now numerous hypovascular hepatic lesions, compatible with widespread metastatic disease to the liver. The largest of these measure up to 3.5 x 1.8 cm in segment 7. Mild periportal edema. Mild intra and extrahepatic biliary ductal dilatation, with common bile duct measuring up to 9 mm in the porta hepatis. Gallbladder appears moderately distended, but is otherwise unremarkable in appearance.  Pancreas: The previously described mass in the head and uncinate process of the pancreas has markedly enlarged and is now centrally necrotic. There is an air-fluid level within the centrally necrotic portion of the mass, which suggests probable erosion of the lesion into the adjacent third portion of the duodenum. This mass is irregular in shape, and therefore difficult to measure, but is estimated to measure approximately 5.7 x 9.5 x 6.0 cm on today's examination. This mass now completely encircles the proximal superior mesenteric artery which is markedly narrowed. The mass comes in close contact with the posterior aspect of the superior mesenteric vein over approximately 1/3 of the circumference of the vessel, and is intimately associated with the splenoportal  confluence, immediately beneath which the superior mesenteric vein is very narrowed and nearly completely occluded, best appreciated on coronal image 51. The splenic vein remains patent at this time, as does the portal vein. Posterior extension of the mass is now causing some compression of the left renal vein which appears markedly narrowed. There is also obscuration of the fat plane between the mass and the adjacent inferior vena cava, best appreciated on axial image 73 of series 3, and coronal image 79.  Spleen: Unremarkable.  Adrenals/Urinary Tract: Bilateral adrenal glands and bilateral kidneys are normal in appearance. No hydroureteronephrosis. Urinary bladder is normal in appearance.  Stomach/Bowel: Normal appearance of the stomach. 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  Vascular/Lymphatic: Numerous prominent peripancreatic lymph nodes are noted, borderline enlarged and mildly enlarged (up to 12 mm in short axis), presumably metastatic. Other enlarged retroperitoneal lymph nodes measuring up to 21 x 26 mm in the left para-aortic station (image 40 of series 5). Vascular findings relevant to be pancreatic lesion, as above. Atherosclerosis throughout the abdominal and pelvic vasculature, without evidence of aneurysm or dissection.  Reproductive: Prostate gland and seminal vesicles are unremarkable in appearance.  Other: Small volume of ascites.  No pneumoperitoneum.  Musculoskeletal: There are no aggressive appearing lytic or blastic lesions noted in the visualized portions of the skeleton.  IMPRESSION: 1. Today's study demonstrates progression of disease with marked enlargement of what is now a 5.7 x 9.5 x 6.0 cm centrally necrotic pancreatic mass which has likely invaded into the third  portion of the duodenum, with extensive peripancreatic and retroperitoneal lymphadenopathy and numerous new hepatic metastasis, as above. There is vascular involvement, as detailed above. Additionally, this mass appears to be causing mild biliary tract obstruction. 2. Additional incidental findings, as above. These results were called by telephone at the time of interpretation on 11/29/2014 at 2:35 pm to Dr. Betsy Coder , who verbally acknowledged these results.   Electronically Signed   By: Vinnie Langton M.D.   On: 11/29/2014 14:41   Ct Chest W Contrast  12/06/2014   CLINICAL DATA:  Followup pancreatic carcinoma  EXAM: CT CHEST, ABDOMEN, AND PELVIS  WITH CONTRAST  TECHNIQUE: Multidetector CT imaging of the chest, abdomen and pelvis was performed following the standard protocol during bolus administration of intravenous contrast.  CONTRAST:  152mL OMNIPAQUE IOHEXOL 300 MG/ML  SOLN  COMPARISON:  CT abdomen and pelvis from 11/29/2014. CT chest from 09/06/2014  FINDINGS: CT CHEST FINDINGS  Mediastinum: Normal heart size. There is no pericardial effusion. Calcified atherosclerotic disease involves the thoracic aorta as well as the LAD coronary artery. The trachea is patent and appears midline. Normal appearance of the esophagus. No mediastinal or hilar adenopathy identified.  Lungs/Pleura: Mild pleural thickening is identified along the posterior right lung. There is no suspicious pulmonary nodule or mass identified.  Musculoskeletal: No aggressive lytic or sclerotic bone lesions identified.  CT ABDOMEN AND PELVIS FINDINGS  Hepatobiliary: Multifocal liver metastases are again identified. Lateral segment of left lobe of liver lesion measures 1.6 cm, image 86/series 6. Previously 1.3 cm. Central right hepatic lobe lesion measures 1.7 cm, image 86/ series 6. Previously 1.6 cm. Posterior lateral right hepatic lobe lesion measures 3 cm, image 97/ series 6. Previously 2.9 cm. The gallbladder appears normal. Mild  intrahepatic bile duct dilatation is similar to previous exam. The common bile duct Measures 1.3 cm, image 52 of series 602. Previously 1.0 cm.  Pancreas: Necrotic mass involving the pancreas is again identified. This measures 10 x 6 cm, image 67/series 2. Previously 9.5 x 5.7 cm. The mass encases the superior mesenteric artery which has subsequently undergone coil embolization.  Spleen: Normal appearance of the spleen. The spleen is upper limits of normal in size measuring 12 cm.  Adrenals/Urinary Tract: The adrenal glands are both normal. Unremarkable appearance of both kidneys. Unremarkable appearance of the urinary bladder.  Stomach/Bowel: The stomach is normal. Pancreatic tumor has mass effect upon the duodenum. The mid and distal small bowel loops are unremarkable. The appendix is visualized and appears normal. Normal appearance of the colon.  Vascular/Lymphatic: Calcified atherosclerotic disease involves the abdominal aorta. Thrombus formation is identified within the proximal SMA status post coil embolization. Coils within the gastro duodenal artery also noted. The portal vein remains patent. The splenic vein is also patent. Retroperitoneal adenopathy is again noted. Index periaortic lymph node measures 2.3 cm, image 124/ series 6. Previously 2.1 cm. Aortocaval lymph node measures 1.4 cm, image 109/series 6. Previously 1.3 cm. Pre aortic lymph node measures 1.3 cm, image 140/ series 6. Previously 1 cm.  Reproductive: Prostate gland enlargement is noted.  Other: Small amount of ascites identified within the pelvis.  Musculoskeletal: No aggressive lytic or sclerotic bone lesions identified. Degenerative disc disease is noted at the L5-S1 level.  IMPRESSION: 1. Mild increase in size of large necrotic mass involving the pancreas. 2. Stable to mild increase in size of multi focal liver metastasis. 3. Increase in size of retroperitoneal adenopathy. 4. No evidence for metastatic disease to the chest. 5.  Atherosclerotic disease including multi vessel coronary artery calcification. 6. Changes compatible with interval coil embolization of the gastroduodenal artery and superior mesenteric artery. 7. Mild increase in caliber of the common bile duct.   Electronically Signed   By: Kerby Moors M.D.   On: 12/06/2014 09:48   Ir Angiogram Visceral Selective  11/30/2014   CLINICAL DATA:  Progression of metastatic pancreatic carcinoma with invasion of the duodenum and active bleeding requiring multiple units of blood transfusion overnight. Endoscopy on 11/28/2014 demonstrated friable tumor extending into the lumen of the duodenum. CT has demonstrated invasion and encasement of the SMA trunk by  the large duodenal tumor.  EXAM: 1. ULTRASOUND GUIDANCE FOR VASCULAR ACCESS OF THE RIGHT COMMON FEMORAL ARTERY 2. SELECTIVE ARTERIOGRAPHY OF THE CELIAC AXIS 3. SELECTIVE ARTERIOGRAPHY OF THE COMMON HEPATIC ARTERY 4. SELECTIVE ARTERIOGRAPHY OF THE GASTRODUODENAL ARTERY 5. SELECTIVE ARTERIOGRAPHY OF A PANCREATICODUODENAL TRUNK OFF OF THE GASTRODUODENAL ARTERY 6. TRANSCATHETER COIL EMBOLIZATION OF PANCREATICODUODENAL TRUNK 7. TRANSCATHETER COIL EMBOLIZATION OF GASTRODUODENAL ARTERY 8. SELECTIVE ARTERIOGRAPHY OF THE SUPERIOR MESENTERIC ARTERY 9. ADDITIONAL SECOND ORDER ARTERIOGRAPHY TIMES TWO OF SUPERIOR MESENTERIC ARTERY BRANCHES 10. TRANSCATHETER COIL EMBOLIZATION OF SUPERIOR MESENTERIC ARTERY  ANESTHESIA/SEDATION: Sedation:  2.5 mg IV Versed sec, 150 mcg IV fentanyl  Total Moderate Sedation Time:  84 minutes.  MEDICATIONS: 1.5 mg IV Dilaudid  CONTRAST:  129mL OMNIPAQUE IOHEXOL 300 MG/ML  SOLN  FLUOROSCOPY TIME:  24 min and 24 seconds.  PROCEDURE: Prior to the procedure, informed consent was obtained from the patient for visceral arteriography with possible transcatheter embolization to treat active upper GI bleeding. Maximal barrier sterile technique was utilized including caps, mask, sterile gowns, sterile gloves, sterile drape, hand  hygiene and skin antiseptic.  The right groin was prepped with Betadine. Local anesthesia was provided with 1% lidocaine. A time-out was performed prior to the procedure.  Ultrasound was used to confirm patency of the right common femoral artery. Under direct ultrasound guidance, the artery was accessed with a micropuncture set. A 5 French sheath was placed over a guidewire. A 5 [place] cobra catheter was advanced into the abdominal aorta. This is used to selectively catheterize the celiac axis. Selective arteriography was through the 5 French catheter.  The 5 French catheter was further advanced into the common hepatic artery. Selective arteriography was performed. A micro catheter was then advanced through the 5 French catheter into a pancreaticoduodenal artery emanating off of the proximal gastroduodenal artery. Selective arteriography was performed. Transcatheter embolization of the pancreaticoduodenal branch was then performed utilizing 2 mm and 3 mm diameter Interlock coils. Arteriography was performed through the micro catheter following embolization.  The micro catheter was then redirected into the main trunk of the gastroduodenal artery. Selective arteriography was performed. The gastroduodenal artery was then embolized utilizing interlock coils bearing in diameter from 3 mm up to 5 mm. Arteriography was performed through the micro catheter following embolization.  The micro catheter was removed. The superior mesenteric artery was catheterized with the 5 French catheter. Selective arteriography was performed. A micro catheter was advanced into a second order SMA branch. Selective arteriography was performed. A micro catheter was then further advanced in the superior mesenteric artery and additional selective arteriography performed. Transcatheter coil embolization was then performed of the superior mesenteric artery with coils ranging in diameter from 5 mm down to 3 mm. Additional arteriography was then  performed through the 5 French catheter positioned in the proximal superior mesenteric artery.  Oblique arteriography was performed at the level of right groin access.  COMPLICATIONS: None immediate.  FINDINGS: Celiac arteriography and common hepatic arteriography shows a patent gastroduodenal artery. There is a prominent lateral pancreaticoduodenal branch off of the proximal GDA. Selective arteriography at the level of the pancreaticoduodenal branch demonstrated a focal pseudoaneurysm at the level of the pancreatic head or proximal duodenum. Tumor blush was also identified. This branch was successfully occluded with coils.  The gastroduodenal artery trunk was also embolized successfully with coils resulting in complete angiographic occlusion.  SMA arteriography shows erosion of the SMA trunk by tumor with formation of a pseudoaneurysm of the SMA trunk and contrast extravasation just inferior  to the level of proximal branches supplying the jejunum, ileum and right colon. Once a micro catheter was advanced into the region of extravasation, contrast injection shows opacification of the duodenum lumen. The bleeding segment of the SMA trunk, including the pseudoaneurysm was able to be successfully occluded by embolization coils. Branch vessel patency was preserved and there is collateral reconstitution of distal branches beyond the SMA trunk. The inferior mesenteric artery is presumably also supplying collateral supply. The IMA was not injected.  IMPRESSION: 1. 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 2. Tumor erosion of the SMA trunk with pseudoaneurysm formation and overt contrast extravasation into the duodenal lumen. The SMA trunk was embolized with coils, preserve ring proximal branch patency. There is collateral reconstitution of multiple distal  branches with SMA injection. The IMA is also presumably supplying additional collateral vessels to bowel.   Electronically Signed   By: Aletta Edouard M.D.   On: 11/30/2014 16:13   Ir Angiogram Visceral Selective  11/30/2014   CLINICAL DATA:  Progression of metastatic pancreatic carcinoma with invasion of the duodenum and active bleeding requiring multiple units of blood transfusion overnight. Endoscopy on 11/28/2014 demonstrated friable tumor extending into the lumen of the duodenum. CT has demonstrated invasion and encasement of the SMA trunk by the large duodenal tumor.  EXAM: 1. ULTRASOUND GUIDANCE FOR VASCULAR ACCESS OF THE RIGHT COMMON FEMORAL ARTERY 2. SELECTIVE ARTERIOGRAPHY OF THE CELIAC AXIS 3. SELECTIVE ARTERIOGRAPHY OF THE COMMON HEPATIC ARTERY 4. SELECTIVE ARTERIOGRAPHY OF THE GASTRODUODENAL ARTERY 5. SELECTIVE ARTERIOGRAPHY OF A PANCREATICODUODENAL TRUNK OFF OF THE GASTRODUODENAL ARTERY 6. TRANSCATHETER COIL EMBOLIZATION OF PANCREATICODUODENAL TRUNK 7. TRANSCATHETER COIL EMBOLIZATION OF GASTRODUODENAL ARTERY 8. SELECTIVE ARTERIOGRAPHY OF THE SUPERIOR MESENTERIC ARTERY 9. ADDITIONAL SECOND ORDER ARTERIOGRAPHY TIMES TWO OF SUPERIOR MESENTERIC ARTERY BRANCHES 10. TRANSCATHETER COIL EMBOLIZATION OF SUPERIOR MESENTERIC ARTERY  ANESTHESIA/SEDATION: Sedation:  2.5 mg IV Versed sec, 150 mcg IV fentanyl  Total Moderate Sedation Time:  84 minutes.  MEDICATIONS: 1.5 mg IV Dilaudid  CONTRAST:  162mL OMNIPAQUE IOHEXOL 300 MG/ML  SOLN  FLUOROSCOPY TIME:  24 min and 24 seconds.  PROCEDURE: Prior to the procedure, informed consent was obtained from the patient for visceral arteriography with possible transcatheter embolization to treat active upper GI bleeding. Maximal barrier sterile technique was utilized including caps, mask, sterile gowns, sterile gloves, sterile drape, hand hygiene and skin antiseptic.  The right groin was prepped with Betadine. Local anesthesia was provided with 1% lidocaine. A time-out was  performed prior to the procedure.  Ultrasound was used to confirm patency of the right common femoral artery. Under direct ultrasound guidance, the artery was accessed with a micropuncture set. A 5 French sheath was placed over a guidewire. A 5 [place] cobra catheter was advanced into the abdominal aorta. This is used to selectively catheterize the celiac axis. Selective arteriography was through the 5 French catheter.  The 5 French catheter was further advanced into the common hepatic artery. Selective arteriography was performed. A micro catheter was then advanced through the 5 French catheter into a pancreaticoduodenal artery emanating off of the proximal gastroduodenal artery. Selective arteriography was performed. Transcatheter embolization of the pancreaticoduodenal branch was then performed utilizing 2 mm and 3 mm diameter Interlock coils. Arteriography was performed through the micro catheter following embolization.  The micro catheter was then redirected into the main trunk of the gastroduodenal artery. Selective arteriography was performed.  The gastroduodenal artery was then embolized utilizing interlock coils bearing in diameter from 3 mm up to 5 mm. Arteriography was performed through the micro catheter following embolization.  The micro catheter was removed. The superior mesenteric artery was catheterized with the 5 French catheter. Selective arteriography was performed. A micro catheter was advanced into a second order SMA branch. Selective arteriography was performed. A micro catheter was then further advanced in the superior mesenteric artery and additional selective arteriography performed. Transcatheter coil embolization was then performed of the superior mesenteric artery with coils ranging in diameter from 5 mm down to 3 mm. Additional arteriography was then performed through the 5 French catheter positioned in the proximal superior mesenteric artery.  Oblique arteriography was performed at the  level of right groin access.  COMPLICATIONS: None immediate.  FINDINGS: Celiac arteriography and common hepatic arteriography shows a patent gastroduodenal artery. There is a prominent lateral pancreaticoduodenal branch off of the proximal GDA. Selective arteriography at the level of the pancreaticoduodenal branch demonstrated a focal pseudoaneurysm at the level of the pancreatic head or proximal duodenum. Tumor blush was also identified. This branch was successfully occluded with coils.  The gastroduodenal artery trunk was also embolized successfully with coils resulting in complete angiographic occlusion.  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 Once a micro catheter was advanced into the region of extravasation, contrast injection shows opacification of the duodenum lumen. The bleeding segment of the SMA trunk, including the pseudoaneurysm was able to be successfully occluded by embolization coils. Branch vessel patency was preserved and there is collateral reconstitution of distal branches beyond the SMA trunk. The inferior mesenteric artery is presumably also supplying collateral supply. The IMA was not injected.  IMPRESSION: 1. 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 2. Tumor erosion of the SMA trunk with pseudoaneurysm formation and overt contrast extravasation into the duodenal lumen. The SMA trunk was embolized with coils, preserve ring proximal branch patency. There is collateral reconstitution of multiple distal branches with SMA injection. The IMA is also presumably supplying additional collateral vessels to bowel.   Electronically Signed   By: Aletta Edouard M.D.   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 Endoscopy on 11/28/2014 demonstrated friable tumor extending into the lumen of the duodenum. CT has demonstrated invasion and encasement of the SMA trunk by the large duodenal tumor.  EXAM: 1. ULTRASOUND GUIDANCE FOR VASCULAR ACCESS OF THE RIGHT COMMON FEMORAL ARTERY 2. SELECTIVE ARTERIOGRAPHY OF THE CELIAC AXIS 3. SELECTIVE ARTERIOGRAPHY OF THE COMMON HEPATIC ARTERY 4. SELECTIVE ARTERIOGRAPHY OF THE GASTRODUODENAL ARTERY 5. SELECTIVE ARTERIOGRAPHY OF A PANCREATICODUODENAL TRUNK OFF OF THE GASTRODUODENAL ARTERY 6. TRANSCATHETER COIL EMBOLIZATION OF PANCREATICODUODENAL TRUNK 7. TRANSCATHETER COIL EMBOLIZATION OF GASTRODUODENAL ARTERY 8. SELECTIVE ARTERIOGRAPHY OF THE SUPERIOR MESENTERIC ARTERY 9. ADDITIONAL SECOND ORDER ARTERIOGRAPHY TIMES TWO OF SUPERIOR MESENTERIC ARTERY BRANCHES 10. TRANSCATHETER COIL EMBOLIZATION OF SUPERIOR MESENTERIC ARTERY  ANESTHESIA/SEDATION: Sedation:  2.5 mg IV Versed sec, 150 mcg IV fentanyl  Total Moderate Sedation Time:  84 minutes.  MEDICATIONS: 1.5 mg IV  Dilaudid  CONTRAST:  141mL OMNIPAQUE IOHEXOL 300 MG/ML  SOLN  FLUOROSCOPY TIME:  24 min and 24 seconds.  PROCEDURE: Prior to the procedure, informed consent was obtained from the patient for visceral arteriography with possible transcatheter embolization to treat active upper GI bleeding. Maximal barrier sterile technique was utilized including caps, mask, sterile gowns, sterile gloves, sterile drape, hand hygiene and skin antiseptic.  The right groin was prepped with Betadine. Local anesthesia was provided with 1% lidocaine. A time-out was performed prior to the procedure.  Ultrasound was used to confirm patency of the right common femoral artery. Under direct  ultrasound guidance, the artery was accessed with a micropuncture set. A 5 French sheath was placed over a guidewire. A 5 [place] cobra catheter was advanced into the abdominal aorta. This is used to selectively catheterize the celiac axis. Selective arteriography was through the 5 French catheter.  The 5 French catheter was further advanced into the common hepatic artery. Selective arteriography was performed. A micro catheter was then advanced through the 5 French catheter into a pancreaticoduodenal artery emanating off of the proximal gastroduodenal artery. Selective arteriography was performed. Transcatheter embolization of the pancreaticoduodenal branch was then performed utilizing 2 mm and 3 mm diameter Interlock coils. Arteriography was performed through the micro catheter following embolization.  The micro catheter was then redirected into the main trunk of the gastroduodenal artery. Selective arteriography was performed. The gastroduodenal artery was then embolized utilizing interlock coils bearing in diameter from 3 mm up to 5 mm. Arteriography was performed through the micro catheter following embolization.  The micro catheter was removed. The superior mesenteric artery was catheterized with the 5 French catheter. Selective arteriography was performed. A micro catheter was advanced into a second order SMA branch. Selective arteriography was performed. A micro catheter was then further advanced in the superior mesenteric artery and additional selective arteriography performed. Transcatheter coil embolization was then performed of the superior mesenteric artery with coils ranging in diameter from 5 mm down to 3 mm. Additional arteriography was then performed through the 5 French catheter positioned in the proximal superior mesenteric artery.  Oblique arteriography was performed at the level of right groin access.  COMPLICATIONS: None immediate.  FINDINGS: Celiac arteriography and common hepatic arteriography  shows a patent gastroduodenal artery. There is a prominent lateral pancreaticoduodenal branch off of the proximal GDA. Selective arteriography at the level of the pancreaticoduodenal branch demonstrated a focal pseudoaneurysm at the level of the pancreatic head or proximal duodenum. Tumor blush was also identified. This branch was successfully occluded with coils.  The gastroduodenal artery trunk was also embolized successfully with coils resulting in complete angiographic occlusion.  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 Once a micro catheter was advanced into the region of extravasation, contrast injection shows opacification of the duodenum lumen. The bleeding segment of the SMA trunk, including the pseudoaneurysm was able to be successfully occluded by embolization coils. Branch vessel patency was preserved and there is collateral reconstitution of distal branches beyond the SMA trunk. The inferior mesenteric artery is presumably also supplying collateral supply. The IMA was not injected.  IMPRESSION: 1. 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 2. Tumor erosion of the SMA trunk  with pseudoaneurysm formation and overt contrast extravasation into the duodenal lumen. The SMA trunk was embolized with coils, preserve ring proximal branch patency. There is collateral reconstitution of multiple distal branches with SMA injection. The IMA is also presumably supplying additional collateral vessels to bowel.   Electronically Signed   By: Aletta Edouard M.D.   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 Endoscopy on 11/28/2014 demonstrated friable tumor extending into the lumen of the duodenum. CT has demonstrated invasion and encasement of the SMA trunk by the large duodenal tumor.  EXAM: 1. ULTRASOUND GUIDANCE FOR VASCULAR ACCESS OF THE RIGHT COMMON FEMORAL ARTERY 2. SELECTIVE ARTERIOGRAPHY OF THE CELIAC AXIS 3. SELECTIVE ARTERIOGRAPHY OF THE COMMON HEPATIC ARTERY 4. SELECTIVE ARTERIOGRAPHY OF THE GASTRODUODENAL ARTERY 5. SELECTIVE ARTERIOGRAPHY OF A PANCREATICODUODENAL TRUNK OFF OF THE GASTRODUODENAL ARTERY 6. TRANSCATHETER COIL EMBOLIZATION OF PANCREATICODUODENAL TRUNK 7. TRANSCATHETER COIL EMBOLIZATION OF GASTRODUODENAL ARTERY 8. SELECTIVE ARTERIOGRAPHY OF THE SUPERIOR MESENTERIC ARTERY 9. ADDITIONAL SECOND ORDER ARTERIOGRAPHY TIMES TWO OF SUPERIOR MESENTERIC ARTERY BRANCHES 10. TRANSCATHETER COIL EMBOLIZATION OF SUPERIOR MESENTERIC ARTERY  ANESTHESIA/SEDATION: Sedation:  2.5 mg IV Versed sec, 150 mcg IV fentanyl  Total Moderate Sedation Time:  84 minutes.  MEDICATIONS: 1.5 mg IV Dilaudid  CONTRAST:  136mL OMNIPAQUE IOHEXOL 300 MG/ML  SOLN  FLUOROSCOPY TIME:  24 min and 24 seconds.  PROCEDURE: Prior to the procedure, informed consent was obtained from the patient for visceral arteriography with possible transcatheter embolization to treat active upper GI bleeding. Maximal barrier sterile technique was utilized including caps, mask, sterile gowns, sterile gloves, sterile drape, hand hygiene and skin antiseptic.  The right groin was prepped with Betadine. Local anesthesia was provided with 1% lidocaine. A time-out was performed prior to the procedure.  Ultrasound was used to confirm patency of the right common femoral artery. Under direct ultrasound guidance, the artery was accessed with a micropuncture set. A 5 French sheath was placed over a guidewire. A 5  [place] cobra catheter was advanced into the abdominal aorta. This is used to selectively catheterize the celiac axis. Selective arteriography was through the 5 French catheter.  The 5 French catheter was further advanced into the common hepatic artery. Selective arteriography was performed. A micro catheter was then advanced through the 5 French catheter into a pancreaticoduodenal artery emanating off of the proximal gastroduodenal artery. Selective arteriography was performed. Transcatheter embolization of the pancreaticoduodenal branch was then performed utilizing 2 mm and 3 mm diameter Interlock coils. Arteriography was performed through the micro catheter following embolization.  The micro catheter was then redirected into the main trunk of the gastroduodenal artery. Selective arteriography was performed. The gastroduodenal artery was then embolized utilizing interlock coils bearing in diameter from 3 mm up to 5 mm. Arteriography was performed through the micro catheter following embolization.  The micro catheter was removed. The superior mesenteric artery was catheterized with the 5 French catheter. Selective arteriography was performed. A micro catheter was advanced into a second order SMA branch. Selective arteriography was performed. A micro catheter was then further advanced in the superior mesenteric artery and additional selective arteriography performed. Transcatheter coil embolization was then performed of the superior mesenteric artery with coils ranging in diameter from 5 mm down to 3 mm. Additional arteriography was then performed through the 5 French catheter positioned in the proximal  superior mesenteric artery.  Oblique arteriography was performed at the level of right groin access.  COMPLICATIONS: None immediate.  FINDINGS: Celiac arteriography and common hepatic arteriography shows a patent gastroduodenal artery. There is a prominent lateral pancreaticoduodenal branch off of the proximal GDA.  Selective arteriography at the level of the pancreaticoduodenal branch demonstrated a focal pseudoaneurysm at the level of the pancreatic head or proximal duodenum. Tumor blush was also identified. This branch was successfully occluded with coils.  The gastroduodenal artery trunk was also embolized successfully with coils resulting in complete angiographic occlusion.  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 Once a micro catheter was advanced into the region of extravasation, contrast injection shows opacification of the duodenum lumen. The bleeding segment of the SMA trunk, including the pseudoaneurysm was able to be successfully occluded by embolization coils. Branch vessel patency was preserved and there is collateral reconstitution of distal branches beyond the SMA trunk. The inferior mesenteric artery is presumably also supplying collateral supply. The IMA was not injected.  IMPRESSION: 1. 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 2. Tumor erosion of the SMA trunk with pseudoaneurysm formation and overt contrast extravasation into the duodenal lumen. The SMA trunk was embolized with coils, preserve ring proximal branch patency. There is collateral reconstitution of multiple distal branches with SMA injection. The IMA is also presumably supplying additional collateral vessels to bowel.   Electronically Signed   By: Aletta Edouard M.D.   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 Endoscopy on 11/28/2014 demonstrated friable tumor extending into the lumen of the duodenum. CT has demonstrated invasion and encasement of the SMA trunk by the large duodenal tumor.  EXAM: 1. ULTRASOUND GUIDANCE FOR VASCULAR ACCESS OF THE RIGHT COMMON FEMORAL ARTERY 2. SELECTIVE ARTERIOGRAPHY OF THE CELIAC AXIS 3. SELECTIVE ARTERIOGRAPHY OF THE COMMON HEPATIC ARTERY 4. SELECTIVE ARTERIOGRAPHY OF THE GASTRODUODENAL ARTERY 5. SELECTIVE ARTERIOGRAPHY OF A PANCREATICODUODENAL TRUNK OFF OF THE GASTRODUODENAL ARTERY 6. TRANSCATHETER COIL EMBOLIZATION OF PANCREATICODUODENAL TRUNK 7. TRANSCATHETER COIL EMBOLIZATION OF GASTRODUODENAL ARTERY 8. SELECTIVE ARTERIOGRAPHY OF THE SUPERIOR MESENTERIC ARTERY 9. ADDITIONAL SECOND ORDER ARTERIOGRAPHY TIMES TWO OF SUPERIOR MESENTERIC ARTERY BRANCHES 10. TRANSCATHETER COIL EMBOLIZATION OF SUPERIOR MESENTERIC ARTERY  ANESTHESIA/SEDATION: Sedation:  2.5 mg IV Versed sec, 150 mcg IV fentanyl  Total Moderate Sedation Time:  84 minutes.  MEDICATIONS: 1.5 mg IV Dilaudid  CONTRAST:  158mL OMNIPAQUE IOHEXOL 300 MG/ML  SOLN  FLUOROSCOPY TIME:  24 min and 24 seconds.  PROCEDURE: Prior to the procedure, informed consent was obtained from the patient for visceral arteriography with possible transcatheter embolization to treat active upper GI bleeding. Maximal barrier sterile technique was utilized including caps, mask, sterile gowns, sterile gloves, sterile drape, hand hygiene and skin antiseptic.  The right groin was prepped with Betadine. Local anesthesia was provided with 1% lidocaine. A time-out was performed prior to the procedure.  Ultrasound was used to confirm patency of the right common femoral artery. Under direct ultrasound guidance, the artery was accessed with a micropuncture set. A 5 French sheath  was placed over a guidewire. A 5 [place] cobra catheter was advanced into the abdominal aorta. This is used to selectively catheterize the celiac axis. Selective  arteriography was through the 5 French catheter.  The 5 French catheter was further advanced into the common hepatic artery. Selective arteriography was performed. A micro catheter was then advanced through the 5 French catheter into a pancreaticoduodenal artery emanating off of the proximal gastroduodenal artery. Selective arteriography was performed. Transcatheter embolization of the pancreaticoduodenal branch was then performed utilizing 2 mm and 3 mm diameter Interlock coils. Arteriography was performed through the micro catheter following embolization.  The micro catheter was then redirected into the main trunk of the gastroduodenal artery. Selective arteriography was performed. The gastroduodenal artery was then embolized utilizing interlock coils bearing in diameter from 3 mm up to 5 mm. Arteriography was performed through the micro catheter following embolization.  The micro catheter was removed. The superior mesenteric artery was catheterized with the 5 French catheter. Selective arteriography was performed. A micro catheter was advanced into a second order SMA branch. Selective arteriography was performed. A micro catheter was then further advanced in the superior mesenteric artery and additional selective arteriography performed. Transcatheter coil embolization was then performed of the superior mesenteric artery with coils ranging in diameter from 5 mm down to 3 mm. Additional arteriography was then performed through the 5 French catheter positioned in the proximal superior mesenteric artery.  Oblique arteriography was performed at the level of right groin access.  COMPLICATIONS: None immediate.  FINDINGS: Celiac arteriography and common hepatic arteriography shows a patent gastroduodenal artery. There is a prominent lateral pancreaticoduodenal branch off of the proximal GDA. Selective arteriography at the level of the pancreaticoduodenal branch demonstrated a focal pseudoaneurysm at the level of the  pancreatic head or proximal duodenum. Tumor blush was also identified. This branch was successfully occluded with coils.  The gastroduodenal artery trunk was also embolized successfully with coils resulting in complete angiographic occlusion.  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 Once a micro catheter was advanced into the region of extravasation, contrast injection shows opacification of the duodenum lumen. The bleeding segment of the SMA trunk, including the pseudoaneurysm was able to be successfully occluded by embolization coils. Branch vessel patency was preserved and there is collateral reconstitution of distal branches beyond the SMA trunk. The inferior mesenteric artery is presumably also supplying collateral supply. The IMA was not injected.  IMPRESSION: 1. 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 2. Tumor erosion of the SMA trunk with pseudoaneurysm formation and overt contrast extravasation into the duodenal lumen. The SMA trunk was embolized with coils, preserve ring proximal branch patency. There is collateral reconstitution of multiple distal branches with SMA injection. The IMA is also presumably supplying additional collateral vessels to bowel.   Electronically Signed   By: Aletta Edouard M.D.   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 Endoscopy on 11/28/2014 demonstrated friable tumor extending into the lumen of the duodenum. CT has  demonstrated invasion and encasement of the  SMA trunk by the large duodenal tumor.  EXAM: 1. ULTRASOUND GUIDANCE FOR VASCULAR ACCESS OF THE RIGHT COMMON FEMORAL ARTERY 2. SELECTIVE ARTERIOGRAPHY OF THE CELIAC AXIS 3. SELECTIVE ARTERIOGRAPHY OF THE COMMON HEPATIC ARTERY 4. SELECTIVE ARTERIOGRAPHY OF THE GASTRODUODENAL ARTERY 5. SELECTIVE ARTERIOGRAPHY OF A PANCREATICODUODENAL TRUNK OFF OF THE GASTRODUODENAL ARTERY 6. TRANSCATHETER COIL EMBOLIZATION OF PANCREATICODUODENAL TRUNK 7. TRANSCATHETER COIL EMBOLIZATION OF GASTRODUODENAL ARTERY 8. SELECTIVE ARTERIOGRAPHY OF THE SUPERIOR MESENTERIC ARTERY 9. ADDITIONAL SECOND ORDER ARTERIOGRAPHY TIMES TWO OF SUPERIOR MESENTERIC ARTERY BRANCHES 10. TRANSCATHETER COIL EMBOLIZATION OF SUPERIOR MESENTERIC ARTERY  ANESTHESIA/SEDATION: Sedation:  2.5 mg IV Versed sec, 150 mcg IV fentanyl  Total Moderate Sedation Time:  84 minutes.  MEDICATIONS: 1.5 mg IV Dilaudid  CONTRAST:  149mL OMNIPAQUE IOHEXOL 300 MG/ML  SOLN  FLUOROSCOPY TIME:  24 min and 24 seconds.  PROCEDURE: Prior to the procedure, informed consent was obtained from the patient for visceral arteriography with possible transcatheter embolization to treat active upper GI bleeding. Maximal barrier sterile technique was utilized including caps, mask, sterile gowns, sterile gloves, sterile drape, hand hygiene and skin antiseptic.  The right groin was prepped with Betadine. Local anesthesia was provided with 1% lidocaine. A time-out was performed prior to the procedure.  Ultrasound was used to confirm patency of the right common femoral artery. Under direct ultrasound guidance, the artery was accessed with a micropuncture set. A 5 French sheath was placed over a guidewire. A 5 [place] cobra catheter was advanced into the abdominal aorta. This is used to selectively catheterize the celiac axis. Selective arteriography was through the 5 French catheter.  The 5 French catheter was further advanced into the common hepatic artery.  Selective arteriography was performed. A micro catheter was then advanced through the 5 French catheter into a pancreaticoduodenal artery emanating off of the proximal gastroduodenal artery. Selective arteriography was performed. Transcatheter embolization of the pancreaticoduodenal branch was then performed utilizing 2 mm and 3 mm diameter Interlock coils. Arteriography was performed through the micro catheter following embolization.  The micro catheter was then redirected into the main trunk of the gastroduodenal artery. Selective arteriography was performed. The gastroduodenal artery was then embolized utilizing interlock coils bearing in diameter from 3 mm up to 5 mm. Arteriography was performed through the micro catheter following embolization.  The micro catheter was removed. The superior mesenteric artery was catheterized with the 5 French catheter. Selective arteriography was performed. A micro catheter was advanced into a second order SMA branch. Selective arteriography was performed. A micro catheter was then further advanced in the superior mesenteric artery and additional selective arteriography performed. Transcatheter coil embolization was then performed of the superior mesenteric artery with coils ranging in diameter from 5 mm down to 3 mm. Additional arteriography was then performed through the 5 French catheter positioned in the proximal superior mesenteric artery.  Oblique arteriography was performed at the level of right groin access.  COMPLICATIONS: None immediate.  FINDINGS: Celiac arteriography and common hepatic arteriography shows a patent gastroduodenal artery. There is a prominent lateral pancreaticoduodenal branch off of the proximal GDA. Selective arteriography at the level of the pancreaticoduodenal branch demonstrated a focal pseudoaneurysm at the level of the pancreatic head or proximal duodenum. Tumor blush was also identified. This branch was successfully occluded with coils.  The  gastroduodenal artery trunk was also embolized successfully with coils resulting in complete angiographic occlusion.  SMA arteriography shows erosion of the SMA trunk by tumor with formation of a pseudoaneurysm of the SMA trunk and contrast  extravasation just inferior to the level of proximal branches supplying the jejunum, ileum and right colon. Once a micro catheter was advanced into the region of extravasation, contrast injection shows opacification of the duodenum lumen. The bleeding segment of the SMA trunk, including the pseudoaneurysm was able to be successfully occluded by embolization coils. Branch vessel patency was preserved and there is collateral reconstitution of distal branches beyond the SMA trunk. The inferior mesenteric artery is presumably also supplying collateral supply. The IMA was not injected.  IMPRESSION: 1. 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 2. Tumor erosion of the SMA trunk with pseudoaneurysm formation and overt contrast extravasation into the duodenal lumen. The SMA trunk was embolized with coils, preserve ring proximal branch patency. There is collateral reconstitution of multiple distal branches with SMA injection. The IMA is also presumably supplying additional collateral vessels to bowel.   Electronically Signed   By: Aletta Edouard M.D.   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 Endoscopy on 11/28/2014 demonstrated friable tumor extending into the lumen of the duodenum. CT has demonstrated invasion and encasement of the SMA trunk by the large duodenal tumor.  EXAM: 1. ULTRASOUND GUIDANCE FOR VASCULAR  ACCESS OF THE RIGHT COMMON FEMORAL ARTERY 2. SELECTIVE ARTERIOGRAPHY OF THE CELIAC AXIS 3. SELECTIVE ARTERIOGRAPHY OF THE COMMON HEPATIC ARTERY 4. SELECTIVE ARTERIOGRAPHY OF THE GASTRODUODENAL ARTERY 5. SELECTIVE ARTERIOGRAPHY OF A PANCREATICODUODENAL TRUNK OFF OF THE GASTRODUODENAL ARTERY 6. TRANSCATHETER COIL EMBOLIZATION OF PANCREATICODUODENAL TRUNK 7. TRANSCATHETER COIL EMBOLIZATION OF GASTRODUODENAL ARTERY 8. SELECTIVE ARTERIOGRAPHY OF THE SUPERIOR MESENTERIC ARTERY 9. ADDITIONAL SECOND ORDER ARTERIOGRAPHY TIMES TWO OF SUPERIOR MESENTERIC ARTERY BRANCHES 10. TRANSCATHETER COIL EMBOLIZATION OF SUPERIOR MESENTERIC ARTERY  ANESTHESIA/SEDATION: Sedation:  2.5 mg IV Versed sec, 150 mcg IV fentanyl  Total Moderate Sedation Time:  84 minutes.  MEDICATIONS: 1.5 mg IV Dilaudid  CONTRAST:  141mL OMNIPAQUE IOHEXOL 300 MG/ML  SOLN  FLUOROSCOPY TIME:  24 min and 24 seconds.  PROCEDURE: Prior to the procedure, informed consent was obtained from the patient for visceral arteriography with possible transcatheter embolization to treat active upper GI bleeding. Maximal barrier sterile technique was utilized including caps, mask, sterile gowns, sterile gloves, sterile drape, hand hygiene and skin antiseptic.  The right groin was prepped with Betadine. Local anesthesia was provided with 1% lidocaine. A time-out was performed prior to the procedure.  Ultrasound was used to confirm patency of the right common femoral artery. Under direct ultrasound guidance, the artery was accessed with a micropuncture set. A 5 French sheath was placed over a guidewire. A 5 [place] cobra catheter was advanced into the abdominal aorta. This is used to selectively catheterize the celiac axis. Selective arteriography was through the 5 French catheter.  The 5 French catheter was further advanced into the common hepatic artery. Selective arteriography was performed. A micro catheter was then advanced through the 5 French catheter into a  pancreaticoduodenal artery emanating off of the proximal gastroduodenal artery. Selective arteriography was performed. Transcatheter embolization of the pancreaticoduodenal branch was then performed utilizing 2 mm and 3 mm diameter Interlock coils. Arteriography was performed through the micro catheter following embolization.  The micro catheter was then redirected into the main trunk of the gastroduodenal  artery. Selective arteriography was performed. The gastroduodenal artery was then embolized utilizing interlock coils bearing in diameter from 3 mm up to 5 mm. Arteriography was performed through the micro catheter following embolization.  The micro catheter was removed. The superior mesenteric artery was catheterized with the 5 French catheter. Selective arteriography was performed. A micro catheter was advanced into a second order SMA branch. Selective arteriography was performed. A micro catheter was then further advanced in the superior mesenteric artery and additional selective arteriography performed. Transcatheter coil embolization was then performed of the superior mesenteric artery with coils ranging in diameter from 5 mm down to 3 mm. Additional arteriography was then performed through the 5 French catheter positioned in the proximal superior mesenteric artery.  Oblique arteriography was performed at the level of right groin access.  COMPLICATIONS: None immediate.  FINDINGS: Celiac arteriography and common hepatic arteriography shows a patent gastroduodenal artery. There is a prominent lateral pancreaticoduodenal branch off of the proximal GDA. Selective arteriography at the level of the pancreaticoduodenal branch demonstrated a focal pseudoaneurysm at the level of the pancreatic head or proximal duodenum. Tumor blush was also identified. This branch was successfully occluded with coils.  The gastroduodenal artery trunk was also embolized successfully with coils resulting in complete angiographic  occlusion.  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 Once a micro catheter was advanced into the region of extravasation, contrast injection shows opacification of the duodenum lumen. The bleeding segment of the SMA trunk, including the pseudoaneurysm was able to be successfully occluded by embolization coils. Branch vessel patency was preserved and there is collateral reconstitution of distal branches beyond the SMA trunk. The inferior mesenteric artery is presumably also supplying collateral supply. The IMA was not injected.  IMPRESSION: 1. 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 2. Tumor erosion of the SMA trunk with pseudoaneurysm formation and overt contrast extravasation into the duodenal lumen. The SMA trunk was embolized with coils, preserve ring proximal branch patency. There is collateral reconstitution of multiple distal branches with SMA injection. The IMA is also presumably supplying additional collateral vessels to bowel.   Electronically Signed   By: Aletta Edouard M.D.   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 Endoscopy on 11/28/2014 demonstrated friable tumor extending into the lumen of the duodenum. CT has demonstrated invasion and encasement of the SMA trunk by the large duodenal tumor.  EXAM: 1. ULTRASOUND GUIDANCE FOR VASCULAR ACCESS OF THE RIGHT COMMON FEMORAL ARTERY 2. SELECTIVE ARTERIOGRAPHY OF THE CELIAC AXIS 3. SELECTIVE ARTERIOGRAPHY OF THE  COMMON HEPATIC ARTERY 4. SELECTIVE ARTERIOGRAPHY OF THE GASTRODUODENAL ARTERY 5. SELECTIVE ARTERIOGRAPHY OF A PANCREATICODUODENAL TRUNK OFF OF THE GASTRODUODENAL ARTERY 6. TRANSCATHETER COIL EMBOLIZATION OF PANCREATICODUODENAL TRUNK 7. TRANSCATHETER COIL EMBOLIZATION OF GASTRODUODENAL ARTERY 8. SELECTIVE ARTERIOGRAPHY OF THE SUPERIOR MESENTERIC ARTERY 9. ADDITIONAL SECOND ORDER ARTERIOGRAPHY TIMES TWO OF SUPERIOR MESENTERIC ARTERY BRANCHES 10. TRANSCATHETER COIL EMBOLIZATION OF SUPERIOR MESENTERIC ARTERY  ANESTHESIA/SEDATION: Sedation:  2.5 mg IV Versed sec, 150 mcg IV fentanyl  Total Moderate Sedation Time:  84 minutes.  MEDICATIONS: 1.5 mg IV Dilaudid  CONTRAST:  148mL OMNIPAQUE IOHEXOL 300 MG/ML  SOLN  FLUOROSCOPY TIME:  24 min and 24 seconds.  PROCEDURE: Prior to the procedure, informed consent was obtained from the patient for visceral arteriography with possible transcatheter embolization to treat active upper GI bleeding. Maximal barrier sterile technique was utilized including caps, mask, sterile gowns, sterile gloves, sterile drape, hand hygiene and skin antiseptic.  The right groin was prepped with Betadine. Local anesthesia was provided with 1% lidocaine. A time-out was performed prior to the procedure.  Ultrasound was used to confirm patency of the right common femoral artery. Under direct ultrasound guidance, the artery was accessed with a micropuncture set. A 5 French sheath was placed over a guidewire. A 5 [place] cobra catheter was advanced into the abdominal aorta. This is used to selectively catheterize the celiac axis. Selective arteriography was through the 5 French catheter.  The 5 French catheter was further advanced into the common hepatic artery. Selective arteriography was performed. A micro catheter was then advanced through the 5 French catheter into a pancreaticoduodenal artery emanating off of the proximal gastroduodenal artery. Selective arteriography was performed. Transcatheter  embolization of the pancreaticoduodenal branch was then performed utilizing 2 mm and 3 mm diameter Interlock coils. Arteriography was performed through the micro catheter following embolization.  The micro catheter was then redirected into the main trunk of the gastroduodenal artery. Selective arteriography was performed. The gastroduodenal artery was then embolized utilizing interlock coils bearing in diameter from 3 mm up to 5 mm. Arteriography was performed through the micro catheter following embolization.  The micro catheter was removed. The superior mesenteric artery was catheterized with the 5 French catheter. Selective arteriography was performed. A micro catheter was advanced into a second order SMA branch. Selective arteriography was performed. A micro catheter was then further advanced in the superior mesenteric artery and additional selective arteriography performed. Transcatheter coil embolization was then performed of the superior mesenteric artery with coils ranging in diameter from 5 mm down to 3 mm. Additional arteriography was then performed through the 5 French catheter positioned in the proximal superior mesenteric artery.  Oblique arteriography was performed at the level of right groin access.  COMPLICATIONS: None immediate.  FINDINGS: Celiac arteriography and common hepatic arteriography shows a patent gastroduodenal artery. There is a prominent lateral pancreaticoduodenal branch off of the proximal GDA. Selective arteriography at the level of the pancreaticoduodenal branch demonstrated a focal pseudoaneurysm at the level of the pancreatic head or proximal duodenum. Tumor blush was also identified. This branch was successfully occluded with coils.  The gastroduodenal artery trunk was also embolized successfully with coils resulting in complete angiographic occlusion.  SMA arteriography shows erosion of the SMA trunk by tumor with formation of a pseudoaneurysm of the SMA trunk and contrast  extravasation just inferior to the level of proximal branches supplying the jejunum, ileum and right colon. Once a micro catheter was advanced into the region of extravasation, contrast injection shows opacification of the duodenum lumen. The bleeding segment of the SMA trunk, including the pseudoaneurysm was able to be successfully occluded by embolization coils. Branch vessel patency was preserved and there is collateral reconstitution of distal branches beyond the SMA trunk. The inferior mesenteric artery is presumably also supplying collateral supply. The IMA was not injected.  IMPRESSION: 1. 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 2. Tumor erosion  of the SMA trunk with pseudoaneurysm formation and overt contrast extravasation into the duodenal lumen. The SMA trunk was embolized with coils, preserve ring proximal branch patency. There is collateral reconstitution of multiple distal branches with SMA injection. The IMA is also presumably supplying additional collateral vessels to bowel.   Electronically Signed   By: Aletta Edouard M.D.   On: 11/30/2014 16:13   Dg Chest Port 1 View  12/09/2014   CLINICAL DATA:  Hypotension, hypoxia, shortness of breath, weakness, rectal bleeding, coffee ground emesis, symptoms began today, history pancreatic cancer, hypertension, former smoker  EXAM: PORTABLE CHEST - 1 VIEW  COMPARISON:  Portable exam 1748 hr compared to 11/28/2014  FINDINGS: LEFT subclavian Port-A-Cath with tip projecting over SVC.  Normal heart size, mediastinal contours, and pulmonary vascularity.  Lungs clear.  No pleural effusion or pneumothorax.  Bones unremarkable.  IMPRESSION: No acute abnormalities.   Electronically Signed   By: Lavonia Dana M.D.   On: 12/09/2014 17:53   Dg Chest Port 1 View  11/28/2014   CLINICAL DATA:  Worsening  generalized weakness and shortness of breath. Initial encounter.  EXAM: PORTABLE CHEST - 1 VIEW  COMPARISON:  Chest radiograph performed 09/25/2014  FINDINGS: The lungs are well-aerated and clear. There is no evidence of focal opacification, pleural effusion or pneumothorax. Bilateral nipple shadows are seen.  The cardiomediastinal silhouette is within normal limits. A left-sided chest port is seen ending about the mid SVC. No acute osseous abnormalities are seen.  IMPRESSION: No acute cardiopulmonary process seen.   Electronically Signed   By: Garald Balding M.D.   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 Endoscopy on 11/28/2014 demonstrated friable tumor extending into the lumen of the duodenum. CT has demonstrated invasion and encasement of the SMA trunk by the large duodenal tumor.  EXAM: 1. ULTRASOUND GUIDANCE FOR VASCULAR ACCESS OF THE RIGHT COMMON FEMORAL ARTERY 2. SELECTIVE ARTERIOGRAPHY OF THE CELIAC AXIS 3. SELECTIVE ARTERIOGRAPHY OF THE COMMON HEPATIC ARTERY 4. SELECTIVE ARTERIOGRAPHY OF THE GASTRODUODENAL ARTERY 5. SELECTIVE ARTERIOGRAPHY OF A PANCREATICODUODENAL TRUNK OFF OF THE GASTRODUODENAL ARTERY 6. TRANSCATHETER COIL EMBOLIZATION OF PANCREATICODUODENAL TRUNK 7. TRANSCATHETER COIL EMBOLIZATION OF GASTRODUODENAL ARTERY 8. SELECTIVE ARTERIOGRAPHY OF THE SUPERIOR MESENTERIC ARTERY 9. ADDITIONAL SECOND ORDER ARTERIOGRAPHY TIMES TWO OF SUPERIOR MESENTERIC ARTERY BRANCHES 10. TRANSCATHETER COIL EMBOLIZATION OF SUPERIOR MESENTERIC ARTERY  ANESTHESIA/SEDATION: Sedation:  2.5 mg IV Versed sec, 150 mcg IV fentanyl  Total Moderate Sedation Time:  84 minutes.  MEDICATIONS: 1.5 mg IV Dilaudid  CONTRAST:  15mL OMNIPAQUE IOHEXOL 300 MG/ML  SOLN  FLUOROSCOPY TIME:  24 min and 24 seconds.   PROCEDURE: Prior to the procedure, informed consent was obtained from the patient for visceral arteriography with possible transcatheter embolization to treat active upper GI bleeding. Maximal barrier sterile technique was utilized including caps, mask, sterile gowns, sterile gloves, sterile drape, hand hygiene and skin antiseptic.  The right groin was prepped with Betadine. Local anesthesia was provided with 1% lidocaine. A time-out was performed prior to the procedure.  Ultrasound was used to confirm patency of the right common femoral artery. Under direct ultrasound guidance, the artery was accessed with a micropuncture set. A 5 French sheath was placed over a guidewire. A 5 [place] cobra catheter was advanced into the abdominal aorta. This is  used to selectively catheterize the celiac axis. Selective arteriography was through the 5 French catheter.  The 5 French catheter was further advanced into the common hepatic artery. Selective arteriography was performed. A micro catheter was then advanced through the 5 French catheter into a pancreaticoduodenal artery emanating off of the proximal gastroduodenal artery. Selective arteriography was performed. Transcatheter embolization of the pancreaticoduodenal branch was then performed utilizing 2 mm and 3 mm diameter Interlock coils. Arteriography was performed through the micro catheter following embolization.  The micro catheter was then redirected into the main trunk of the gastroduodenal artery. Selective arteriography was performed. The gastroduodenal artery was then embolized utilizing interlock coils bearing in diameter from 3 mm up to 5 mm. Arteriography was performed through the micro catheter following embolization.  The micro catheter was removed. The superior mesenteric artery was catheterized with the 5 French catheter. Selective arteriography was performed. A micro catheter was advanced into a second order SMA branch. Selective arteriography was performed.  A micro catheter was then further advanced in the superior mesenteric artery and additional selective arteriography performed. Transcatheter coil embolization was then performed of the superior mesenteric artery with coils ranging in diameter from 5 mm down to 3 mm. Additional arteriography was then performed through the 5 French catheter positioned in the proximal superior mesenteric artery.  Oblique arteriography was performed at the level of right groin access.  COMPLICATIONS: None immediate.  FINDINGS: Celiac arteriography and common hepatic arteriography shows a patent gastroduodenal artery. There is a prominent lateral pancreaticoduodenal branch off of the proximal GDA. Selective arteriography at the level of the pancreaticoduodenal branch demonstrated a focal pseudoaneurysm at the level of the pancreatic head or proximal duodenum. Tumor blush was also identified. This branch was successfully occluded with coils.  The gastroduodenal artery trunk was also embolized successfully with coils resulting in complete angiographic occlusion.  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 Once a micro catheter was advanced into the region of extravasation, contrast injection shows opacification of the duodenum lumen. The bleeding segment of the SMA trunk, including the pseudoaneurysm was able to be successfully occluded by embolization coils. Branch vessel patency was preserved and there is collateral reconstitution of distal branches beyond the SMA trunk. The inferior mesenteric artery is presumably also supplying collateral supply. The IMA was not injected.  IMPRESSION: 1. Transcatheter coil embolization of the gastroduodenal artery as well as a pancreaticoduodenal branch supplying tumor with evidence of focal pseudoaneurysm in the  distribution of the pancreaticoduodenal supply. 2. Tumor erosion of the SMA trunk with pseudoaneurysm formation and overt contrast extravasation into the duodenal lumen. The SMA trunk was embolized with coils, preserve ring proximal branch patency. There is collateral reconstitution of multiple distal branches with SMA injection. The IMA is also presumably supplying additional collateral vessels to bowel.   Electronically Signed   By: Aletta Edouard M.D.   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 Endoscopy on 11/28/2014 demonstrated friable tumor extending into the lumen of the duodenum. CT has demonstrated invasion and encasement of the SMA trunk by the large duodenal tumor.  EXAM: 1. ULTRASOUND GUIDANCE FOR  VASCULAR ACCESS OF THE RIGHT COMMON FEMORAL ARTERY 2. SELECTIVE ARTERIOGRAPHY OF THE CELIAC AXIS 3. SELECTIVE ARTERIOGRAPHY OF THE COMMON HEPATIC ARTERY 4. SELECTIVE ARTERIOGRAPHY OF THE GASTRODUODENAL ARTERY 5. SELECTIVE ARTERIOGRAPHY OF A PANCREATICODUODENAL TRUNK OFF OF THE GASTRODUODENAL ARTERY 6. TRANSCATHETER COIL EMBOLIZATION OF PANCREATICODUODENAL TRUNK 7. TRANSCATHETER COIL EMBOLIZATION OF GASTRODUODENAL ARTERY 8. SELECTIVE ARTERIOGRAPHY OF THE SUPERIOR MESENTERIC ARTERY 9. ADDITIONAL SECOND ORDER ARTERIOGRAPHY TIMES TWO OF SUPERIOR MESENTERIC ARTERY BRANCHES 10. TRANSCATHETER COIL EMBOLIZATION OF SUPERIOR MESENTERIC ARTERY  ANESTHESIA/SEDATION: Sedation:  2.5 mg IV Versed sec, 150 mcg IV fentanyl  Total Moderate Sedation Time:  84 minutes.  MEDICATIONS: 1.5 mg IV Dilaudid  CONTRAST:  166mL OMNIPAQUE IOHEXOL 300 MG/ML  SOLN  FLUOROSCOPY TIME:  24 min and 24 seconds.  PROCEDURE: Prior to the procedure, informed consent was obtained from the patient for visceral arteriography  with possible transcatheter embolization to treat active upper GI bleeding. Maximal barrier sterile technique was utilized including caps, mask, sterile gowns, sterile gloves, sterile drape, hand hygiene and skin antiseptic.  The right groin was prepped with Betadine. Local anesthesia was provided with 1% lidocaine. A time-out was performed prior to the procedure.  Ultrasound was used to confirm patency of the right common femoral artery. Under direct ultrasound guidance, the artery was accessed with a micropuncture set. A 5 French sheath was placed over a guidewire. A 5 [place] cobra catheter was advanced into the abdominal aorta. This is used to selectively catheterize the celiac axis. Selective arteriography was through the 5 French catheter.  The 5 French catheter was further advanced into the common hepatic artery. Selective arteriography was performed. A micro catheter was then advanced through the 5 French catheter into a pancreaticoduodenal artery emanating off of the proximal gastroduodenal artery. Selective arteriography was performed. Transcatheter embolization of the pancreaticoduodenal branch was then performed utilizing 2 mm and 3 mm diameter Interlock coils. Arteriography was performed through the micro catheter following embolization.  The micro catheter was then redirected into the main trunk of the gastroduodenal artery. Selective arteriography was performed. The gastroduodenal artery was then embolized utilizing interlock coils bearing in diameter from 3 mm up to 5 mm. Arteriography was performed through the micro catheter following embolization.  The micro catheter was removed. The superior mesenteric artery was catheterized with the 5 French catheter. Selective arteriography was performed. A micro catheter was advanced into a second order SMA branch. Selective arteriography was performed. A micro catheter was then further advanced in the superior mesenteric artery and additional selective  arteriography performed. Transcatheter coil embolization was then performed of the superior mesenteric artery with coils ranging in diameter from 5 mm down to 3 mm. Additional arteriography was then performed through the 5 French catheter positioned in the proximal superior mesenteric artery.  Oblique arteriography was performed at the level of right groin access.  COMPLICATIONS: None immediate.  FINDINGS: Celiac arteriography and common hepatic arteriography shows a patent gastroduodenal artery. There is a prominent lateral pancreaticoduodenal branch off of the proximal GDA. Selective arteriography at the level of the pancreaticoduodenal branch demonstrated a focal pseudoaneurysm at the level of the pancreatic head or proximal duodenum. Tumor blush was also identified. This branch was successfully occluded with coils.  The gastroduodenal artery trunk was also embolized successfully with coils resulting in complete angiographic occlusion.  SMA arteriography shows erosion of the SMA trunk by tumor with formation of a pseudoaneurysm of the SMA trunk and contrast extravasation just inferior to the level of proximal branches supplying the jejunum, ileum  and right colon. Once a micro catheter was advanced into the region of extravasation, contrast injection shows opacification of the duodenum lumen. The bleeding segment of the SMA trunk, including the pseudoaneurysm was able to be successfully occluded by embolization coils. Branch vessel patency was preserved and there is collateral reconstitution of distal branches beyond the SMA trunk. The inferior mesenteric artery is presumably also supplying collateral supply. The IMA was not injected.  IMPRESSION: 1. 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 2. Tumor erosion of the SMA trunk with pseudoaneurysm  formation and overt contrast extravasation into the duodenal lumen. The SMA trunk was embolized with coils, preserve ring proximal branch patency. There is collateral reconstitution of multiple distal branches with SMA injection. The IMA is also presumably supplying additional collateral vessels to bowel.   Electronically Signed   By: Aletta Edouard M.D.   On: 11/30/2014 16:13    Microbiology: No results found for this or any previous visit (from the past 240 hour(s)).   Labs: Basic Metabolic Panel:  Recent Labs Lab 12/10/14 0410 12/11/14 0540  NA 139 139  K 4.1 4.2  CL 109 114*  CO2 24 21  GLUCOSE 98 145*  BUN 13 16  CREATININE 0.50 0.60  CALCIUM 7.1* 6.7*   Liver Function Tests:  Recent Labs Lab 12/10/14 0410  AST 15  ALT 11  ALKPHOS 54  BILITOT 0.6  PROT 4.4*  ALBUMIN 1.8*   No results for input(s): LIPASE, AMYLASE in the last 168 hours. No results for input(s): AMMONIA in the last 168 hours. CBC:  Recent Labs Lab 12/10/14 0410 12/10/14 1530 12/10/14 2049 12/11/14 0540  WBC 13.4*  --   --  18.0*  NEUTROABS 11.3*  --   --  16.1*  HGB 7.1* 7.6* 10.2* 9.3*  HCT 22.0* 22.6* 29.7* 26.4*  MCV 89.4  --   --  88.3  PLT 402*  --   --  325   Cardiac Enzymes: No results for input(s): CKTOTAL, CKMB, CKMBINDEX, TROPONINI in the last 168 hours. BNP: BNP (last 3 results) No results for input(s): PROBNP in the last 8760 hours. CBG: No results for input(s): GLUCAP in the last 168 hours.  Time coordinating discharge: Over 30 minutes

## 2014-12-17 ENCOUNTER — Telehealth: Payer: Self-pay | Admitting: *Deleted

## 2014-12-17 ENCOUNTER — Encounter: Payer: Self-pay | Admitting: Radiation Oncology

## 2014-12-17 ENCOUNTER — Ambulatory Visit: Payer: BLUE CROSS/BLUE SHIELD

## 2014-12-17 MED ORDER — HEPARIN SOD (PORK) LOCK FLUSH 100 UNIT/ML IV SOLN
500.0000 [IU] | INTRAVENOUS | Status: AC | PRN
  Administered 2014-12-17: 500 [IU]

## 2014-12-17 NOTE — Telephone Encounter (Signed)
Pat RN called requesting clarification of Protonix orders.  Verbal order received and read back from Dr. Benay Spice to change protonix to 40 mg by mouth daily.  Pat given this order with this call.

## 2014-12-17 NOTE — Progress Notes (Signed)
Pt for discharge to [facility].   CSW facilitated pt discharge needs including discussing with [facility] liaison, Erling Conte, confirming d/c summary faxed to [facility], providing RN phone number to call report, discussing with pt at bedside, providing pt d/c packet at bedside. Pt plans to transfer to [facility] by pt friend by private vehicle.   Pt appreciative of CSW assistance with transition to [facility].   No further social work needs identified at this time.   CSW signing off.   Alison Murray, MSW, [place] [employer] [phone]

## 2014-12-17 NOTE — Progress Notes (Signed)
IP PROGRESS NOTE  Subjective:  No bleeding. The pain is controlled with MS Contin and Dilaudid. No new complaint. Objective: Vital signs in last 24 hours: Blood pressure 106/67, pulse 108, temperature 98.8 F (37.1 C), temperature source Axillary, resp. rate 16, height 5\' 9"  (1.753 m), weight 164 lb 3.9 oz (74.5 kg), SpO2 98 %.  Intake/Output from previous day: 01/24 0701 - 01/25 0700 In: 440 [P.O.:360; I.V.:80] Out: 2815 [Urine:2815]  Physical Exam: Lungs: Clear anteriorly Cardiac: Regular rate and rhythm  Abdomen: Mildly distended, soft and nontender Vascular: Pitting edema in the lower arms and legs bilaterally-improved    Portacath/PICC-without erythema  Medications: I have reviewed the patient's current medications.  Assessment/Plan:  1. 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  Cycle 3 FOLFIRINOX 10/31/2014.  CA-19-9 improved (33.4) on 11/13/2014.  Cycle 4 FOLFIRINOX 11/13/2014.  CA-19-9 improved (30.5) 11/26/2014.  Restaging CT 11/29/2014 consistent with progression of the primary pancreas tumor with invasion of the duodenum, respiratory lymphadenopathy, and liver metastases 2. Pain secondary to #1  3. Psoriasis   4. Acute nausea and vomiting with cycle 1 FOLFIRINOX-emend and prophylactic Decadron were added with cycle 2, improved.  5. Urinary hesitancy-this is most likely related to narcotic analgesics, chemotherapy, and prostatic hypertrophy. 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 Improved with Flomax.  6. Anorexia/weight loss secondary to #1  7. Admission with hypotension and severe anemia secondary to upper  gastrointestinal bleeding on 11/28/2013   Upper endoscopy 11/28/2013 confirmed a friable mass in the duodenum consistent with pancreas cancer invading the duodenum  Embolization of branches of the gastroduodenal and superior mesenteric arteries on 11/30/2014  Palliative radiation/Xeloda started 12/06/2014  Admission 12/09/2014 with recurrent GI bleeding         8. Severe anemia secondary to acute GI blood loss        9. Anasarca secondary to intravenous hydration  The acute GI bleeding has resolved. I discussed disposition plans with Richard Cantu. He agrees to a [facility] transfer today. We will consider discharge to home and potentially resuming treatment for pancreas cancer is his condition stabilizes over the next several weeks. Recommendations: 1. Transfer to [facility] today 2. Continue Dilaudid and MS Contin 3. Ativan for nausea and anxiety 4. I will follow him at [facility]     LOS: 8 days   [facility], Dominica Severin  12/17/2014, 9:53 AM

## 2014-12-17 NOTE — Progress Notes (Signed)
Patient discharged to [facility], copies of all discharge medications and instructions given to patient to provide to staff of [facility] on arrival. Patient to transport via private vehicle.

## 2014-12-18 ENCOUNTER — Ambulatory Visit: Payer: BLUE CROSS/BLUE SHIELD

## 2014-12-19 ENCOUNTER — Ambulatory Visit: Payer: BLUE CROSS/BLUE SHIELD

## 2014-12-20 ENCOUNTER — Ambulatory Visit: Payer: BLUE CROSS/BLUE SHIELD

## 2014-12-21 ENCOUNTER — Ambulatory Visit: Payer: BLUE CROSS/BLUE SHIELD

## 2014-12-24 ENCOUNTER — Ambulatory Visit: Payer: BLUE CROSS/BLUE SHIELD

## 2014-12-24 DEATH — deceased

## 2014-12-25 ENCOUNTER — Telehealth: Payer: Self-pay | Admitting: Oncology

## 2014-12-25 ENCOUNTER — Ambulatory Visit: Payer: BLUE CROSS/BLUE SHIELD

## 2014-12-25 NOTE — Telephone Encounter (Signed)
Received death certificate °

## 2014-12-26 ENCOUNTER — Ambulatory Visit: Payer: BLUE CROSS/BLUE SHIELD

## 2014-12-27 ENCOUNTER — Ambulatory Visit: Payer: BLUE CROSS/BLUE SHIELD

## 2014-12-28 ENCOUNTER — Ambulatory Visit: Payer: BLUE CROSS/BLUE SHIELD

## 2014-12-31 ENCOUNTER — Ambulatory Visit: Payer: BLUE CROSS/BLUE SHIELD

## 2015-01-01 ENCOUNTER — Ambulatory Visit: Payer: BLUE CROSS/BLUE SHIELD

## 2015-01-02 ENCOUNTER — Ambulatory Visit: Payer: BLUE CROSS/BLUE SHIELD

## 2015-01-02 NOTE — Progress Notes (Signed)
  Radiation Oncology         [phone] ________________________________  Name: Richard Cantu MRN: [id number]  Date: 12/17/2014  DOB: 11-Oct-1956  End of Treatment Note  Diagnosis:   Pancreatic cancer     Indication for treatment:  Palliative       Radiation treatment dates:   12/06/2014 through 12/07/2014  Site/dose:   The patient was planned to receive a dose of 37.5 gray in 15 fractions using a 4 field 3-D conformal technique. Daily image guidance was used.  Narrative: The patient tolerated radiation treatment but his overall status continued to decline. The patient at the time of his course of radiation had a rapidly expanding tumor. The patient soon after he began radiation treatment decided to forego any further aggressive measures. His treatment plan was therefore suspended.  Plan: No further follow-up was scheduled with the patient in our clinic. ________________________________  Jodelle Gross, M.D., Ph.D.

## 2015-01-03 ENCOUNTER — Ambulatory Visit: Payer: BLUE CROSS/BLUE SHIELD

## 2015-01-04 ENCOUNTER — Ambulatory Visit: Payer: BLUE CROSS/BLUE SHIELD

## 2015-01-07 ENCOUNTER — Ambulatory Visit: Payer: BLUE CROSS/BLUE SHIELD

## 2015-01-08 ENCOUNTER — Ambulatory Visit: Payer: BLUE CROSS/BLUE SHIELD

## 2015-01-09 ENCOUNTER — Telehealth: Payer: Self-pay | Admitting: *Deleted

## 2015-01-09 ENCOUNTER — Ambulatory Visit: Payer: BLUE CROSS/BLUE SHIELD

## 2015-01-09 NOTE — Telephone Encounter (Signed)
Received fax from [facility] for refill for Xeloda tablets. According to EMR patient is deceased.   Request faxed back to [facility] with "DO NOT REFILL - PATIENT DECEASED."

## 2015-01-10 ENCOUNTER — Ambulatory Visit: Payer: BLUE CROSS/BLUE SHIELD

## 2015-02-14 ENCOUNTER — Telehealth: Payer: Self-pay | Admitting: *Deleted

## 2015-02-14 NOTE — Telephone Encounter (Signed)
Darnelle Catalan called requesting "Exact date diagnosis of cancer was made.  [employer] is incontestant of his cremation and ned this information."  07-31-2014 CT abdomen revealed mass inferior head of uncinate.  This date provided for patient's brother who reports he was P.O.A.  for his brother.

## 2015-02-14 NOTE — Telephone Encounter (Signed)
Message from pt's brother at 31 requesting pt's date of diagnosis. Noted this call has been handled by triage RN.

## 2015-10-09 IMAGING — CT CT CHEST W/ CM
3 of 9 series · 15 of 46 positions shown, 17 images · IV contrast (OMNIPAQUE)
Comparison: CT abdomen and pelvis from 11/29/2014. CT chest from
09/06/2014

CLINICAL DATA: Followup pancreatic carcinoma

EXAM:
CT CHEST, ABDOMEN, AND PELVIS WITH CONTRAST
TECHNIQUE: Multidetector CT imaging of the chest, abdomen and pelvis was
performed following the standard protocol during bolus
administration of intravenous contrast.
CONTRAST:  100mL OMNIPAQUE IOHEXOL 300 MG/ML  SOLN

[Series 6: venous thins pacs · axial · portal-venous · 0.76mm/px · z∈[-588,-60]mm · 9 of 220 slices shown, 11 images]
[im 22/220  soft-tissue]
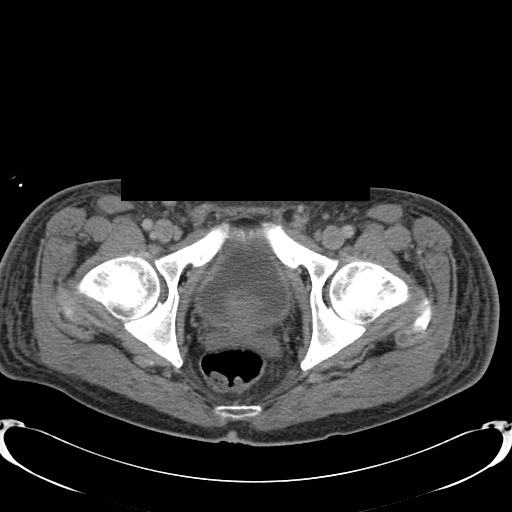
[im 22/220  bone]
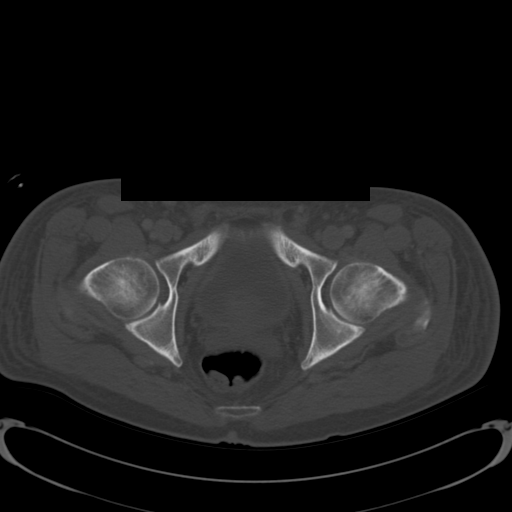
[im 44/220  soft-tissue]
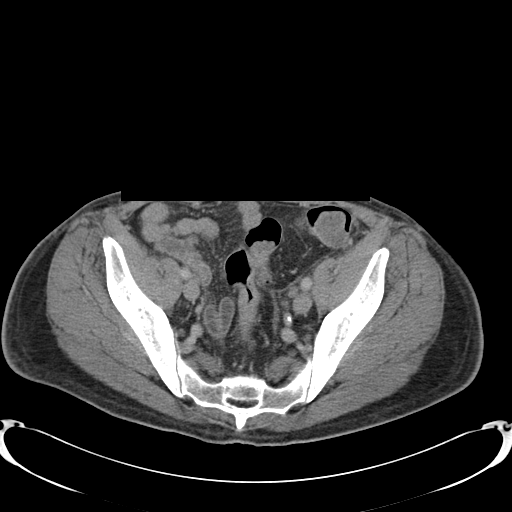
[im 66/220  soft-tissue]
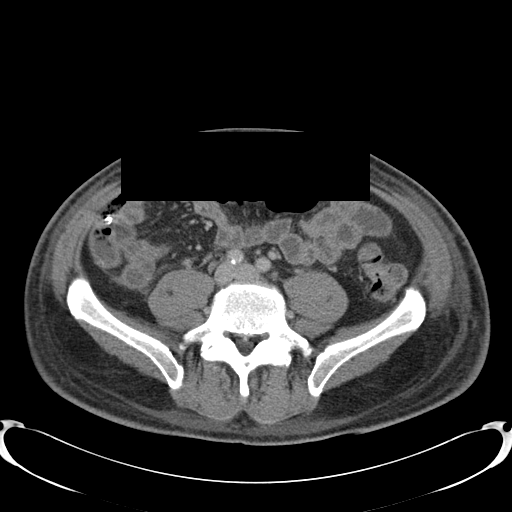
[im 88/220  soft-tissue]
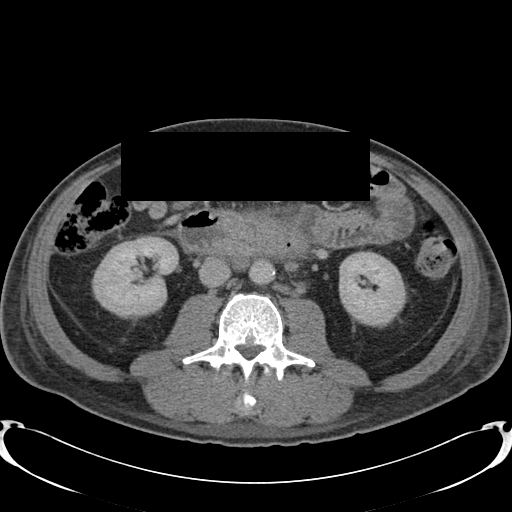
[im 110/220  soft-tissue]
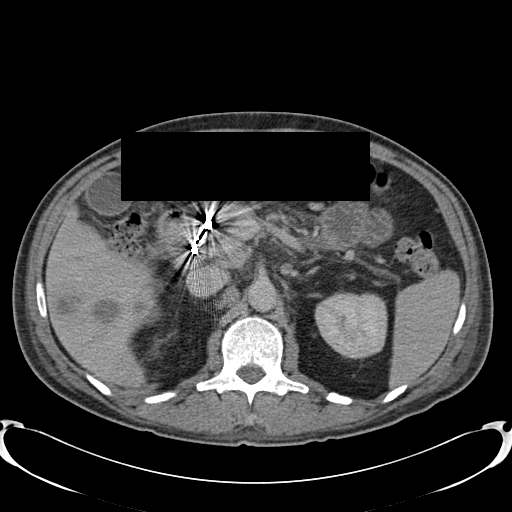
[im 132/220  soft-tissue]
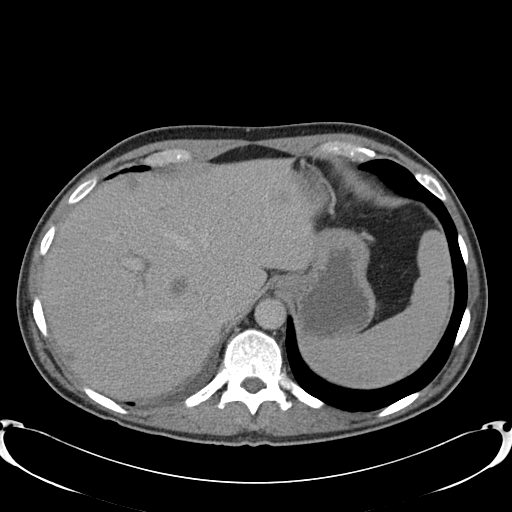
[im 154/220  soft-tissue]
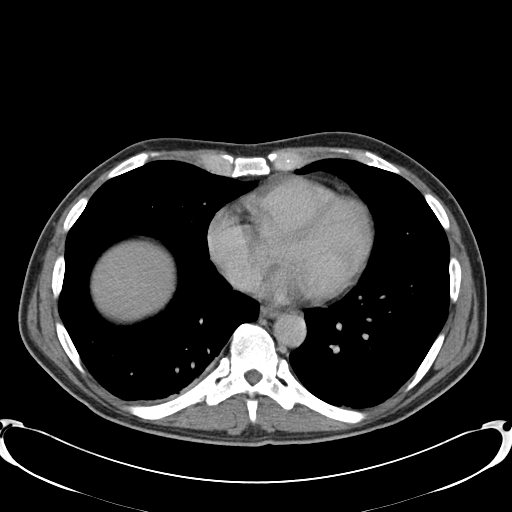
[im 176/220  soft-tissue]
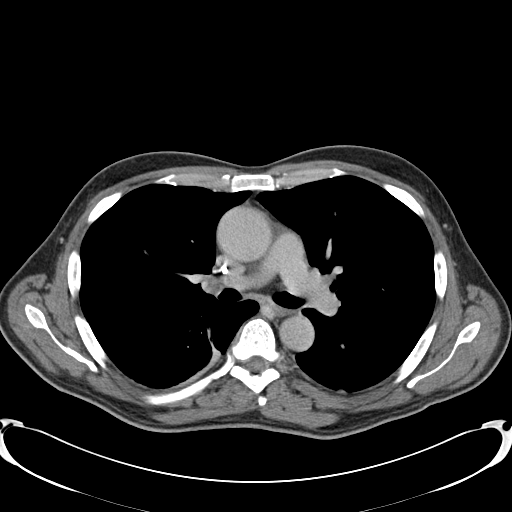
[im 198/220  soft-tissue]
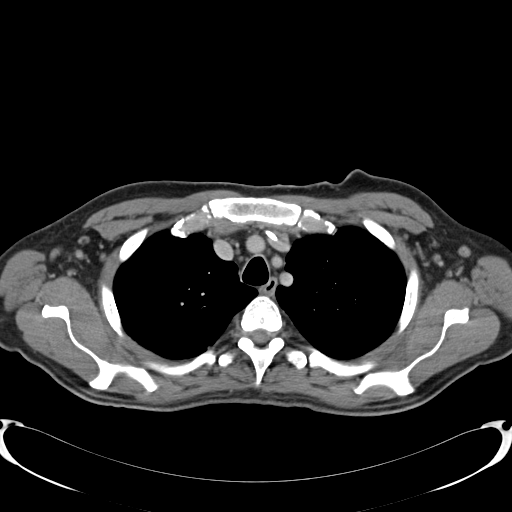
[im 198/220  bone]
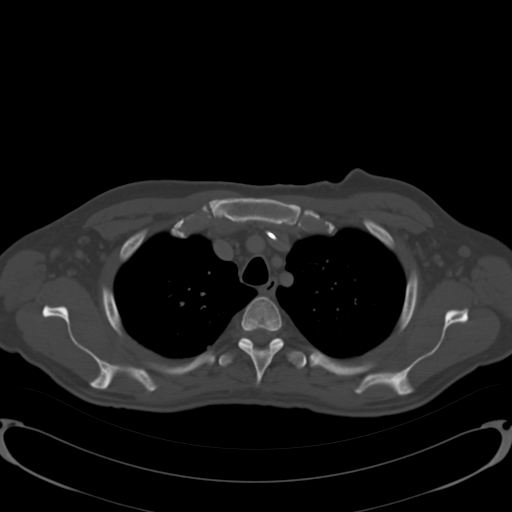

[Series 8: lung windows · axial · 0.76mm/px · z∈[-544,-324]mm · 3 of 132 slices shown]
[im 22/132  bone]
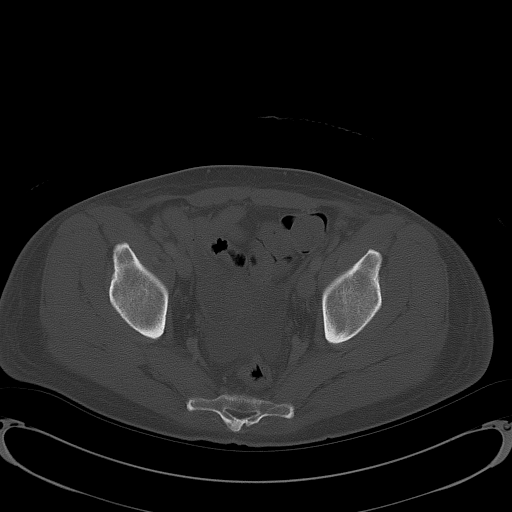
[im 44/132  bone]
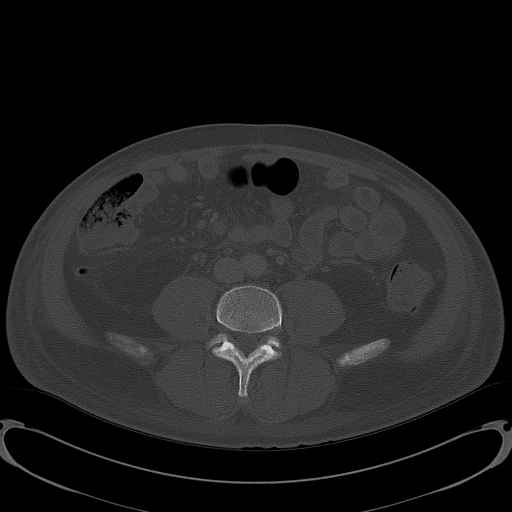
[im 66/132  bone]
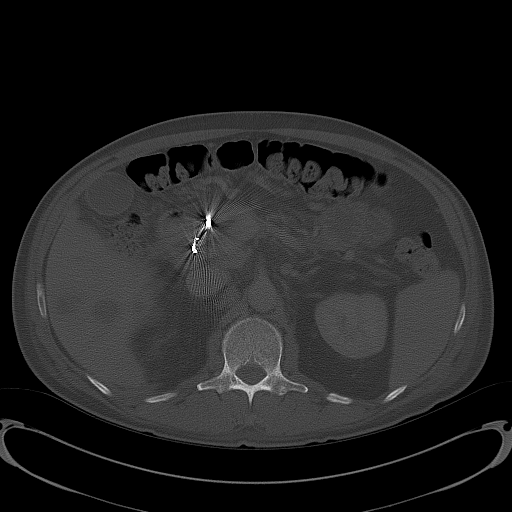

[Series 602: <mpr thick range> · coronal · 0.76mm/px · 3 of 132 slices shown]
[im 27/132  soft-tissue]
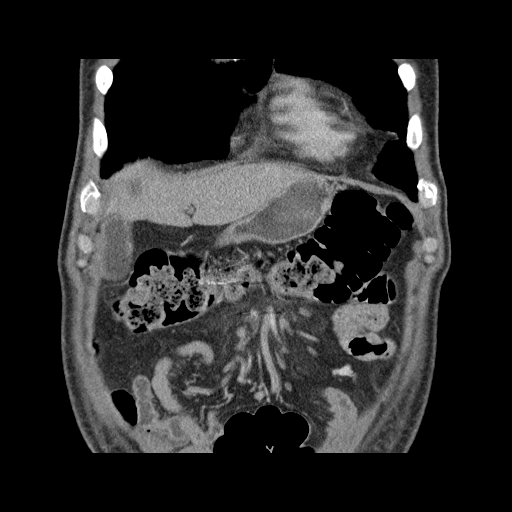
[im 53/132  soft-tissue]
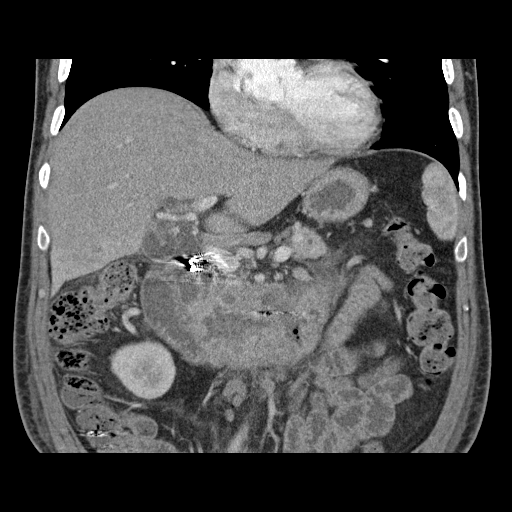
[im 79/132  soft-tissue]
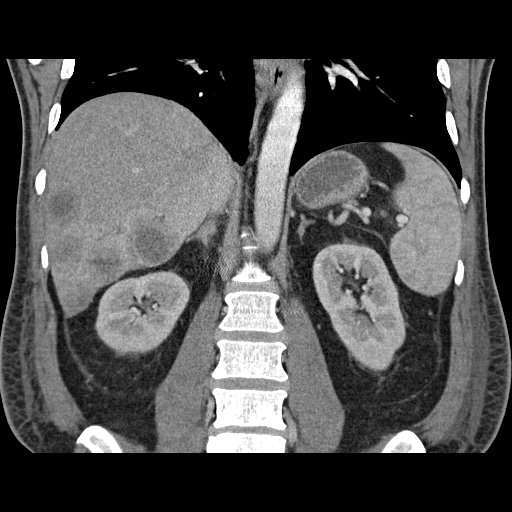

[15 of 46 positions shown; findings below may reference images not displayed]

FINDINGS: CT CHEST FINDINGS

Mediastinum: Normal heart size. There is no pericardial effusion.
Calcified atherosclerotic disease involves the thoracic aorta as
well as the LAD coronary artery. The trachea is patent and appears
midline. Normal appearance of the esophagus. No mediastinal or hilar
adenopathy identified.

Lungs/Pleura: Mild pleural thickening is identified along the
posterior right lung. There is no suspicious pulmonary nodule or
mass identified.

Musculoskeletal: No aggressive lytic or sclerotic bone lesions
identified.

CT ABDOMEN AND PELVIS FINDINGS

Hepatobiliary: Multifocal liver metastases are again identified.
Lateral segment of left lobe of liver lesion measures 1.6 cm, image
86/series 6. Previously 1.3 cm. Central right hepatic lobe lesion
measures 1.7 cm, image 86/ series 6. Previously 1.6 cm. Posterior
lateral right hepatic lobe lesion measures 3 cm, image 97/ series 6.
Previously 2.9 cm. The gallbladder appears normal. Mild intrahepatic
bile duct dilatation is similar to previous exam. The common bile
duct Measures 1.3 cm, image 52 of series 602. Previously 1.0 cm.

Pancreas: Necrotic mass involving the pancreas is again identified.
This measures 10 x 6 cm, image 67/series 2. Previously 9.5 x 5.7 cm.
The mass encases the superior mesenteric artery which has
subsequently undergone coil embolization.

Spleen: Normal appearance of the spleen. The spleen is upper limits
of normal in size measuring 12 cm.

Adrenals/Urinary Tract: The adrenal glands are both normal.
Unremarkable appearance of both kidneys. Unremarkable appearance of
the urinary bladder.

Stomach/Bowel: The stomach is normal. Pancreatic tumor has mass
effect upon the duodenum. The mid and distal small bowel loops are
unremarkable. The appendix is visualized and appears normal. Normal
appearance of the colon.

Vascular/Lymphatic: Calcified atherosclerotic disease involves the
abdominal aorta. Thrombus formation is identified within the
proximal SMA status post coil embolization. Coils within the gastro
duodenal artery also noted. The portal vein remains patent. The
splenic vein is also patent. Retroperitoneal adenopathy is again
noted. Index periaortic lymph node measures 2.3 cm, image 124/
series 6. Previously 2.1 cm. Aortocaval lymph node measures 1.4 cm,
image 109/series 6. Previously 1.3 cm. Pre aortic lymph node
measures 1.3 cm, image 140/ series 6. Previously 1 cm.

Reproductive: Prostate gland enlargement is noted.

Other: Small amount of ascites identified within the pelvis.

Musculoskeletal: No aggressive lytic or sclerotic bone lesions
identified. Degenerative disc disease is noted at the L5-S1 level.
IMPRESSION: 1. Mild increase in size of large necrotic mass involving the
pancreas.
2. Stable to mild increase in size of multi focal liver metastasis.
3. Increase in size of retroperitoneal adenopathy.
4. No evidence for metastatic disease to the chest.
5. Atherosclerotic disease including multi vessel coronary artery
calcification.
6. Changes compatible with interval coil embolization of the
gastroduodenal artery and superior mesenteric artery.
7. Mild increase in caliber of the common bile duct.

## 2015-10-12 IMAGING — CR DG CHEST 1V PORT
1 series · 1 of 1 positions shown · non-contrast
Comparison: Portable exam 4473 hr compared to 11/28/2014

CLINICAL DATA: Hypotension, hypoxia, shortness of breath, weakness,
rectal bleeding, coffee ground emesis, symptoms began today, history
pancreatic cancer, hypertension, former smoker

EXAM:
PORTABLE CHEST - 1 VIEW

[AP]
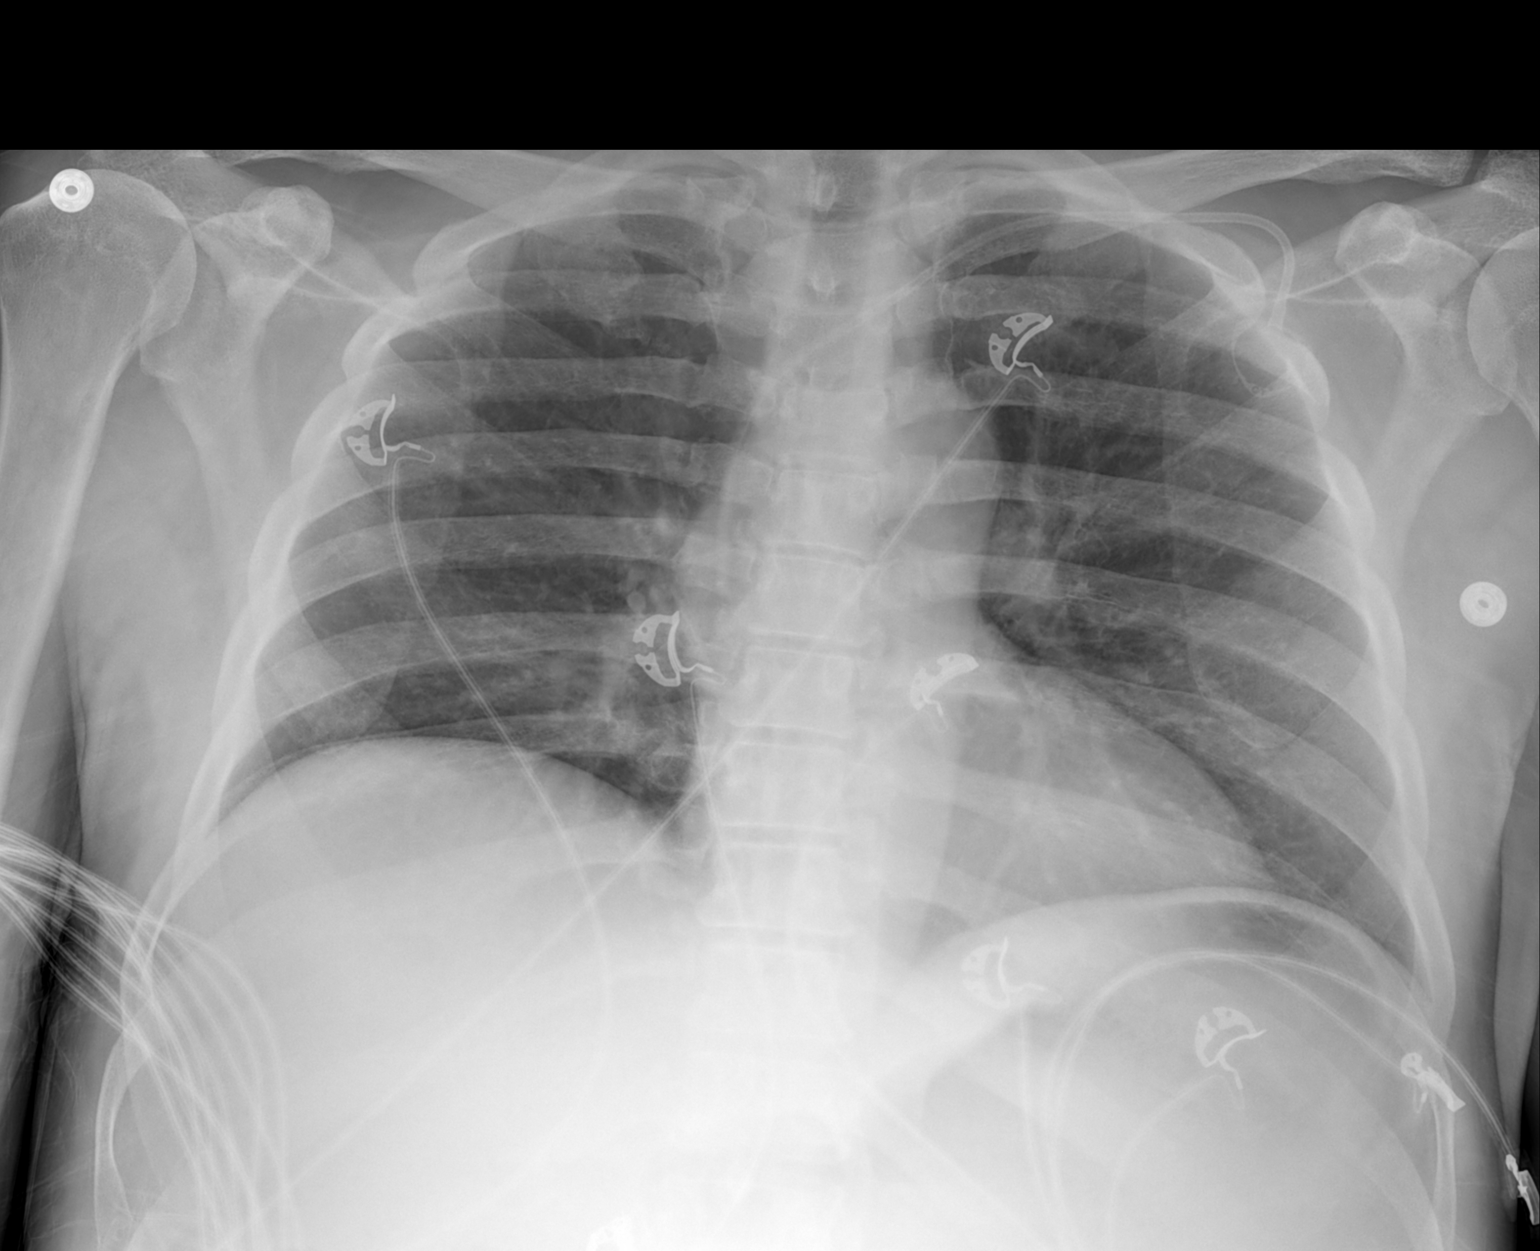

[1 of 1 positions shown; findings below may reference images not displayed]

FINDINGS: LEFT subclavian Port-A-Cath with tip projecting over SVC.

Normal heart size, mediastinal contours, and pulmonary vascularity.

Lungs clear.

No pleural effusion or pneumothorax.

Bones unremarkable.
IMPRESSION: No acute abnormalities.
